# Patient Record
Sex: Female | Born: 1995 | Race: Black or African American | Hispanic: No | Smoking: Current every day smoker
Health system: Southern US, Community
[De-identification: ages and names within clinical notes are randomized; demographics above are authoritative.]

## PROBLEM LIST (undated history)

## (undated) DIAGNOSIS — Z3490 Encounter for supervision of normal pregnancy, unspecified, unspecified trimester: Secondary | ICD-10-CM

## (undated) DIAGNOSIS — M549 Dorsalgia, unspecified: Secondary | ICD-10-CM

## (undated) DIAGNOSIS — O219 Vomiting of pregnancy, unspecified: Secondary | ICD-10-CM

## (undated) DIAGNOSIS — G8929 Other chronic pain: Secondary | ICD-10-CM

## (undated) DIAGNOSIS — O139 Gestational [pregnancy-induced] hypertension without significant proteinuria, unspecified trimester: Secondary | ICD-10-CM

## (undated) DIAGNOSIS — A549 Gonococcal infection, unspecified: Secondary | ICD-10-CM

## (undated) DIAGNOSIS — J45909 Unspecified asthma, uncomplicated: Secondary | ICD-10-CM

## (undated) HISTORY — DX: Encounter for supervision of normal pregnancy, unspecified, unspecified trimester: Z34.90

## (undated) HISTORY — DX: Vomiting of pregnancy, unspecified: O21.9

## (undated) HISTORY — DX: Gonococcal infection, unspecified: A54.9

## (undated) HISTORY — DX: Unspecified asthma, uncomplicated: J45.909

## (undated) HISTORY — DX: Gestational (pregnancy-induced) hypertension without significant proteinuria, unspecified trimester: O13.9

---
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
## 2016-04-01 ENCOUNTER — Encounter (HOSPITAL_COMMUNITY): Payer: Self-pay | Admitting: Emergency Medicine

## 2016-04-01 ENCOUNTER — Emergency Department (HOSPITAL_COMMUNITY)
Admission: EM | Admit: 2016-04-01 | Discharge: 2016-04-01 | Disposition: A | Discharge status: Home / Self Care | Payer: Medicaid Other | Attending: Dermatology | Admitting: Dermatology

## 2016-04-01 DIAGNOSIS — Z79899 Other long term (current) drug therapy: Secondary | ICD-10-CM | POA: Diagnosis not present

## 2016-04-01 DIAGNOSIS — R1030 Lower abdominal pain, unspecified: Secondary | ICD-10-CM | POA: Diagnosis not present

## 2016-04-01 DIAGNOSIS — Z5321 Procedure and treatment not carried out due to patient leaving prior to being seen by health care provider: Secondary | ICD-10-CM | POA: Insufficient documentation

## 2016-04-01 HISTORY — DX: Dorsalgia, unspecified: M54.9

## 2016-04-01 HISTORY — DX: Other chronic pain: G89.29

## 2016-04-01 LAB — COMPREHENSIVE METABOLIC PANEL
ALBUMIN: 4.4 g/dL (ref 3.5–5.0)
ALT: 8 U/L — ABNORMAL LOW (ref 14–54)
ANION GAP: 7 (ref 5–15)
AST: 14 U/L — ABNORMAL LOW (ref 15–41)
Alkaline Phosphatase: 60 U/L (ref 38–126)
BILIRUBIN TOTAL: 0.6 mg/dL (ref 0.3–1.2)
BUN: 6 mg/dL (ref 6–20)
CHLORIDE: 108 mmol/L (ref 101–111)
CO2: 26 mmol/L (ref 22–32)
Calcium: 9.5 mg/dL (ref 8.9–10.3)
Creatinine, Ser: 0.82 mg/dL (ref 0.44–1.00)
GFR calc Af Amer: >60 mL/min (ref >60)
Glucose, Bld: 90 mg/dL (ref 65–99)
POTASSIUM: 3.6 mmol/L (ref 3.5–5.1)
Sodium: 141 mmol/L (ref 135–145)
TOTAL PROTEIN: 7.5 g/dL (ref 6.5–8.1)

## 2016-04-01 LAB — CBC
HEMATOCRIT: 35.6 % — AB (ref 36.0–46.0)
HEMOGLOBIN: 11.8 g/dL — AB (ref 12.0–15.0)
MCH: 28.6 pg (ref 26.0–34.0)
MCHC: 33.1 g/dL (ref 30.0–36.0)
MCV: 86.4 fL (ref 78.0–100.0)
Platelets: 324 10*3/uL (ref 150–400)
RBC: 4.12 MIL/uL (ref 3.87–5.11)
RDW: 15.5 % (ref 11.5–15.5)
WBC: 12.6 10*3/uL — AB (ref 4.0–10.5)

## 2016-04-01 LAB — LIPASE, BLOOD: LIPASE: 10 U/L — AB (ref 11–51)

## 2016-04-01 NOTE — ED Notes (Signed)
Patient complaining of lower abdominal pain x 2 days. States pain started radiating into right side today. Denies dysuria.

## 2016-04-01 NOTE — ED Notes (Signed)
Notified by registration that patient left.  

## 2016-04-02 ENCOUNTER — Encounter (HOSPITAL_COMMUNITY): Payer: Self-pay

## 2016-04-02 ENCOUNTER — Emergency Department (HOSPITAL_COMMUNITY)
Admission: EM | Admit: 2016-04-02 | Discharge: 2016-04-02 | Disposition: A | Discharge status: Home / Self Care | Payer: Medicaid Other | Attending: Emergency Medicine | Admitting: Emergency Medicine

## 2016-04-02 DIAGNOSIS — R1030 Lower abdominal pain, unspecified: Secondary | ICD-10-CM | POA: Diagnosis not present

## 2016-04-02 DIAGNOSIS — K5900 Constipation, unspecified: Secondary | ICD-10-CM | POA: Diagnosis not present

## 2016-04-02 DIAGNOSIS — R109 Unspecified abdominal pain: Secondary | ICD-10-CM

## 2016-04-02 LAB — POC URINE PREG, ED: Preg Test, Ur: NEGATIVE

## 2016-04-02 NOTE — ED Notes (Signed)
Pt reports she has been constipated since yesterday morning, last BM reported was 03/31/16. Pt reports stool was hard. Pt reports lower abdominal pain, more towards the right side.

## 2016-04-02 NOTE — Discharge Instructions (Signed)
Get miralax and put one dose or 17 g in 8 ounces of water,  take 1 dose every 30 minutes for 2-3 hours or until you  get good results and then once or twice daily to prevent constipation.  Recheck if you get fever, vomiting or seem worse.     Constipation, Adult Constipation is when a person has fewer than three bowel movements a week, has difficulty having a bowel movement, or has stools that are dry, hard, or larger than normal. As people grow older, constipation is more common. A low-fiber diet, not taking in enough fluids, and taking certain medicines may make constipation worse.  CAUSES   Certain medicines, such as antidepressants, pain medicine, iron supplements, antacids, and water pills.   Certain diseases, such as diabetes, irritable bowel syndrome (IBS), thyroid disease, or depression.   Not drinking enough water.   Not eating enough fiber-rich foods.   Stress or travel.   Lack of physical activity or exercise.   Ignoring the urge to have a bowel movement.   Using laxatives too much.  SIGNS AND SYMPTOMS   Having fewer than three bowel movements a week.   Straining to have a bowel movement.   Having stools that are hard, dry, or larger than normal.   Feeling full or bloated.   Pain in the lower abdomen.   Not feeling relief after having a bowel movement.  DIAGNOSIS  Your health care provider will take a medical history and perform a physical exam. Further testing may be done for severe constipation. Some tests may include:  A barium enema X-ray to examine your rectum, colon, and, sometimes, your small intestine.   A sigmoidoscopy to examine your lower colon.   A colonoscopy to examine your entire colon. TREATMENT  Treatment will depend on the severity of your constipation and what is causing it. Some dietary treatments include drinking more fluids and eating more fiber-rich foods. Lifestyle treatments may include regular exercise. If these diet  and lifestyle recommendations do not help, your health care provider may recommend taking over-the-counter laxative medicines to help you have bowel movements. Prescription medicines may be prescribed if over-the-counter medicines do not work.  HOME CARE INSTRUCTIONS   Eat foods that have a lot of fiber, such as fruits, vegetables, whole grains, and beans.  Limit foods high in fat and processed sugars, such as french fries, hamburgers, cookies, candies, and soda.   A fiber supplement may be added to your diet if you cannot get enough fiber from foods.   Drink enough fluids to keep your urine clear or pale yellow.   Exercise regularly or as directed by your health care provider.   Go to the restroom when you have the urge to go. Do not hold it.   Only take over-the-counter or prescription medicines as directed by your health care provider. Do not take other medicines for constipation without talking to your health care provider first.  SEEK IMMEDIATE MEDICAL CARE IF:   You have bright red blood in your stool.   Your constipation lasts for more than 4 days or gets worse.   You have abdominal or rectal pain.   You have thin, pencil-like stools.   You have unexplained weight loss. MAKE SURE YOU:   Understand these instructions.  Will watch your condition.  Will get help right away if you are not doing well or get worse.   This information is not intended to replace advice given to you by your health  care provider. Make sure you discuss any questions you have with your health care provider.   Document Released: 05/29/2004 Document Revised: 09/21/2014 Document Reviewed: 06/12/2013 Elsevier Interactive Patient Education [employer].

## 2016-04-02 NOTE — ED Notes (Cosign Needed)
Pt refused in/out cath. Dr Lynelle DoctorKnapp informed. This nurse re-explained the importance of having this a clean urine specimen, as she is on menstrual cycle and this will affect the results and accuracy of urine specimen.  Pt is still refusing in/out cath.

## 2016-04-02 NOTE — ED Notes (Signed)
Pt informed of need for urine sample.

## 2016-04-02 NOTE — ED Provider Notes (Signed)
CSN: [id number]     Arrival date & time 04/02/16  0305 History   First MD Initiated Contact with Patient 04/02/16 0345 AM     Chief Complaint  Patient presents with  . Constipation  . Abdominal Pain  . Sore Throat     (Consider location/radiation/quality/duration/timing/severity/associated sxs/prior Treatment) HPI   Patient states she started getting abdominal pain yesterday, July 19. She states there is a constant right upper quadrant pain that is sharp and hurts more if she breathes deeply. Nothing she does makes it feel better. She also has some lower abdominal pain in the suprapubic area that is described as constant and cramping. She states movement makes it feel worse. Nothing makes it feel better. She states her last bowel movement was 2 days ago and it was hard however that is normal for her. She normally has a bowel movement every day. She denies nausea, vomiting, or diarrhea. She states her period started 7 days ago and it's almost gone but she still is seeing some blood. This was her last normal period and is on time and acting normally as far as length and flow. She does not use birth control and she states her last sexual contact was 3 or 4 months ago. She does describe some dysuria and a pressure over her bladder when she urinates. She denies frequency.  PCP [facility] in [place]   Past Medical History  Diagnosis Date  . Chronic back pain    History reviewed. No pertinent past surgical history. No family history on file. Social History  Substance Use Topics  . Smoking status: Never Smoker   . Smokeless tobacco: None  . Alcohol Use: No   unemployed  OB History    No data available     Review of Systems  All other systems reviewed and are negative.     Allergies  Ultram; Amoxicillin; Ceclor; and Red dye  Home Medications   Prior to Admission medications   Medication Sig Start Date End Date Taking? Authorizing Provider  azithromycin (ZITHROMAX Z-PAK)  250 MG tablet 2 po day one, then 1 daily x 4 days 05/30/13   Bethann Berkshire, MD  cetirizine (ZYRTEC) 10 MG tablet Take 10 mg by mouth daily as needed for allergies.    Historical Provider, MD  cyclobenzaprine (FLEXERIL) 5 MG tablet Take 1 tablet (5 mg total) by mouth 3 (three) times daily as needed for muscle spasms. 11/02/15   Hope Orlene Och, NP  diclofenac (VOLTAREN) 50 MG EC tablet Take 1 tablet (50 mg total) by mouth 2 (two) times daily. 11/02/15   Hope Orlene Och, NP  omeprazole (PRILOSEC) 20 MG capsule Take 20 mg by mouth daily as needed (for ulcer).    Historical Provider, MD   BP 131/82 mmHg  Pulse 79  Temp(Src) 98.3 F (36.8 C) (Oral)  Ht  (1.753 m)  Wt 243 lb (110.224 kg)  BMI 35.87 kg/m2  SpO2 100%  LMP 03/27/2016  Vital signs normal   Physical Exam  Constitutional: She is oriented to person, place, and time. She appears well-developed and well-nourished.  Non-toxic appearance. She does not appear ill. No distress.  HENT:  Head: Normocephalic and atraumatic.  Right Ear: External ear normal.  Left Ear: External ear normal.  Nose: Nose normal. No mucosal edema or rhinorrhea.  Mouth/Throat: Oropharynx is clear and moist and mucous membranes are normal. No dental abscesses or uvula [id number]: Conjunctivae and EOM are normal. Pupils are equal, round, and  reactive to light.  Neck: Normal range of motion and full passive range of motion without pain. Neck supple.  Cardiovascular: Normal rate, regular rhythm and normal heart sounds.  Exam reveals no gallop and no friction rub.   No murmur heard. Pulmonary/Chest: Effort normal and breath sounds normal. No respiratory distress. She has no wheezes. She has no rhonchi. She has no rales. She exhibits no tenderness and no crepitus.  Abdominal: Soft. Normal appearance and bowel sounds are normal. She exhibits no distension. There is tenderness in the right upper quadrant and suprapubic area. There is no rebound and no guarding.     Musculoskeletal: Normal range of motion. She exhibits no edema or tenderness.  Moves all extremities well.   Neurological: She is alert and oriented to person, place, and time. She has normal strength. No cranial nerve deficit.  Skin: Skin is warm, dry and intact. No rash noted. No erythema. No pallor.  Psychiatric: She has a normal mood and affect. Her speech is normal and behavior is normal. Her mood appears not anxious.  Nursing note and vitals reviewed.   ED Course  Procedures (including critical care time)    Patient had come earlier in the evening and left without being seen. She had blood work done during that ED visit. I discussed that she needs in and out cath because she still having menstrual bleeding.  Despite being instructed several times by nursing staff and myself patient has refused a cath urine sample. The urine does have a slightly reddish orange tint to it. Therefore only urine pregnancy test was done.  We discussed she could go to her primary care doctor next week when she is off her period and have her urine tested at that time.   Labs Review  Results for orders placed or performed during the hospital encounter of 04/02/16  P[facility] preg, ED [facility])  Result Value Ref Range   Preg Test, Ur NEGATIVE NEGATIVE   Laboratory interpretation all normal  Results for orders placed or performed during the hospital encounter of 04/01/16  Lipase, blood  Result Value Ref Range   Lipase 10 (L) 11 - 51 U/L  Comprehensive metabolic panel  Result Value Ref Range   Sodium 141 135 - 145 mmol/L   Potassium 3.6 3.5 - 5.1 mmol/L   Chloride 108 101 - 111 mmol/L   CO2 26 22 - 32 mmol/L   Glucose, Bld 90 65 - 99 mg/dL   BUN 6 6 - 20 mg/dL   Creatinine, Ser 1.610.82 0.44 - 1.00 mg/dL   Calcium 9.5 8.9 - 09.610.3 mg/dL   Total Protein 7.5 6.5 - 8.1 g/dL   Albumin 4.4 3.5 - 5.0 g/dL   AST 14 (L) 15 - 41 U/L   ALT 8 (L) 14 - 54 U/L   Alkaline Phosphatase 60 38 - 126 U/L    Total Bilirubin 0.6 0.3 - 1.2 mg/dL   GFR calc non Af Amer >60 >60 mL/min   GFR calc Af Amer >60 >60 mL/min   Anion gap 7 5 - 15  CBC  Result Value Ref Range   WBC 12.6 (H) 4.0 - 10.5 K/uL   RBC 4.12 3.87 - 5.11 MIL/uL   Hemoglobin 11.8 (L) 12.0 - 15.0 g/dL   HCT 04.535.6 (L) 40.936.0 - 81.146.0 %   MCV 86.4 78.0 - 100.0 fL   MCH 28.6 26.0 - 34.0 pg   MCHC 33.1 30.0 - 36.0 g/dL   RDW 91.415.5 78.211.5 -  15.5 %   Platelets 324 150 - 400 K/uL   Laboratory interpretation all normal except mild anemia   I have personally reviewed and evaluated these images and lab results as part of my medical decision-making.    MDM   Final diagnoses:  Abdominal pain, unspecified abdominal location  Constipation, unspecified constipation type   meds OTC miralax  Plan discharge  Devoria Albe, MD, Concha Pyo, MD 04/02/16 [phone]

## 2016-07-29 ENCOUNTER — Encounter: Payer: Self-pay | Admitting: *Deleted

## 2016-07-30 ENCOUNTER — Other Ambulatory Visit: Payer: Self-pay | Admitting: Obstetrics and Gynecology

## 2016-07-30 DIAGNOSIS — O3680X0 Pregnancy with inconclusive fetal viability, not applicable or unspecified: Secondary | ICD-10-CM

## 2016-07-31 ENCOUNTER — Other Ambulatory Visit: Payer: Self-pay | Admitting: Obstetrics and Gynecology

## 2016-07-31 ENCOUNTER — Ambulatory Visit (INDEPENDENT_AMBULATORY_CARE_PROVIDER_SITE_OTHER): Payer: Medicaid Other

## 2016-07-31 ENCOUNTER — Encounter (INDEPENDENT_AMBULATORY_CARE_PROVIDER_SITE_OTHER): Payer: Self-pay

## 2016-07-31 DIAGNOSIS — O3680X0 Pregnancy with inconclusive fetal viability, not applicable or unspecified: Secondary | ICD-10-CM

## 2016-07-31 DIAGNOSIS — Z3A01 Less than 8 weeks gestation of pregnancy: Secondary | ICD-10-CM

## 2016-07-31 DIAGNOSIS — O3491 Maternal care for abnormality of pelvic organ, unspecified, first trimester: Secondary | ICD-10-CM | POA: Diagnosis not present

## 2016-07-31 NOTE — Progress Notes (Signed)
US TV 6+1 wks GS w/ys,no fetal pole seen,normal right ov,simple left corpus luteal cyst 2.8 x 2.7 x 2.8 cm,pt will come back in 10 days for f/u ultrasound.

## 2016-08-05 ENCOUNTER — Other Ambulatory Visit: Payer: Self-pay | Admitting: Obstetrics and Gynecology

## 2016-08-05 ENCOUNTER — Telehealth: Payer: Self-pay | Admitting: Obstetrics & Gynecology

## 2016-08-05 DIAGNOSIS — O3680X0 Pregnancy with inconclusive fetal viability, not applicable or unspecified: Secondary | ICD-10-CM

## 2016-08-05 NOTE — Telephone Encounter (Signed)
Pt's mother called stating that pt is having discharge with pinkish spots with it. Pt's mother wants to know if that is normal. Pt is pregnant

## 2016-08-05 NOTE — Telephone Encounter (Signed)
Pt's mom states that she is having a clear vaginal d/c that is a little bit mucus and has some spotting of blood.  Advised this can be normal in early pregnancy.  If starts passing clots or has heavy flow of blood.

## 2016-08-10 ENCOUNTER — Other Ambulatory Visit: Payer: Self-pay | Admitting: Obstetrics and Gynecology

## 2016-08-10 ENCOUNTER — Ambulatory Visit (INDEPENDENT_AMBULATORY_CARE_PROVIDER_SITE_OTHER): Payer: Medicaid Other

## 2016-08-10 DIAGNOSIS — Z3A01 Less than 8 weeks gestation of pregnancy: Secondary | ICD-10-CM

## 2016-08-10 DIAGNOSIS — O3680X0 Pregnancy with inconclusive fetal viability, not applicable or unspecified: Secondary | ICD-10-CM | POA: Diagnosis not present

## 2016-08-10 DIAGNOSIS — O3491 Maternal care for abnormality of pelvic organ, unspecified, first trimester: Secondary | ICD-10-CM | POA: Diagnosis not present

## 2016-08-10 NOTE — Progress Notes (Signed)
US 6+5 wks,single IUP w/ys,pos fht 130 bpm,crl 8.7 mm,simple left corpus luteal cyst 3.1 x 2.8 x 2.5 cm,normal right ovary

## 2016-08-14 ENCOUNTER — Ambulatory Visit (INDEPENDENT_AMBULATORY_CARE_PROVIDER_SITE_OTHER): Payer: Self-pay | Admitting: Adult Health

## 2016-08-14 ENCOUNTER — Encounter: Payer: Self-pay | Admitting: Adult Health

## 2016-08-14 VITALS — BP 120/60 | HR 62 | Wt 240.0 lb

## 2016-08-14 DIAGNOSIS — Z1389 Encounter for screening for other disorder: Secondary | ICD-10-CM

## 2016-08-14 DIAGNOSIS — Z3A01 Less than 8 weeks gestation of pregnancy: Secondary | ICD-10-CM

## 2016-08-14 DIAGNOSIS — Z3401 Encounter for supervision of normal first pregnancy, first trimester: Secondary | ICD-10-CM

## 2016-08-14 DIAGNOSIS — Z3490 Encounter for supervision of normal pregnancy, unspecified, unspecified trimester: Secondary | ICD-10-CM

## 2016-08-14 DIAGNOSIS — O219 Vomiting of pregnancy, unspecified: Secondary | ICD-10-CM | POA: Diagnosis not present

## 2016-08-14 DIAGNOSIS — Z331 Pregnant state, incidental: Secondary | ICD-10-CM

## 2016-08-14 HISTORY — DX: Vomiting of pregnancy, unspecified: O21.9

## 2016-08-14 HISTORY — DX: Encounter for supervision of normal pregnancy, unspecified, unspecified trimester: Z34.90

## 2016-08-14 LAB — POCT URINALYSIS DIPSTICK
GLUCOSE UA: NEGATIVE
Ketones, UA: NEGATIVE
NITRITE UA: NEGATIVE
RBC UA: NEGATIVE

## 2016-08-14 MED ORDER — PROMETHAZINE HCL 25 MG PO TABS: 25.0000 mg | ORAL_TABLET | Freq: Four times a day (QID) | ORAL | 1 refills | Status: DC | PRN

## 2016-08-14 NOTE — Patient Instructions (Signed)
Eat often  Take phenergan as needed every 6 hours Follow up in 3 weeks

## 2016-08-14 NOTE — Progress Notes (Signed)
Subjective:  Nilda RiggsGemini Stanbery is a 20 y.o. G1P0 African American female at 7114w2d by LMP and US being seen today for her first obstetrical visit.  Her obstetrical history is significant for asthma.  Pregnancy history fully reviewed.  Patient reports nausea, and vomiting. Denies vb, cramping, uti s/s, abnormal/malodorous vag d/c, or vulvovaginal itching/irritation.  BP 120/60   Pulse 62   Wt 240 lb (108.9 kg)   LMP 06/14/2016 (Approximate)   BMI 35.44 kg/m   HISTORY: OB History  Gravida Para Term Preterm AB Living  1            SAB TAB Ectopic Multiple Live Births               # Outcome Date GA Lbr Len/2nd Weight Sex Delivery Anes PTL Lv  1 Current              Past Medical History:  Diagnosis Date  . Asthma   . Chronic back pain   . Supervision of normal pregnancy 08/14/2016    [facility] at   7+2 weeks FOB  malick mcfarland 21 yo bm 2nd Dating By  LMP and US  Pap   NA GC/CT Initial:                36+wks: Genetic Screen NT/IT:  CF screen   Declined  Anatomic US  Flu vaccine  Tdap Recommended ~ 28wks Glucose Screen  2 hr GBS  Feed Preference  Contraception  Circumcision  Childbirth Classes  Pediatrician     History reviewed. No pertinent surgical history. Family History  Problem Relation Age of Onset  . Other Mother     degenerative disc  . Other Father     heart surgery    Exam   System:     General: Well developed & nourished, no acute distress   Skin: Warm & dry, normal coloration and turgor, no rashes   Neurologic: Alert & oriented, normal mood   Cardiovascular: Regular rate & rhythm   Respiratory: Effort & rate normal, LCTAB, acyanotic   Abdomen: Soft, non tender   Extremities: normal strength, tone   Pelvic Exam:    Perineum: deferred   Vulva: deferred   Vagina:  deferred   Cervix: deferred   Uterus: deferred     Assessment:   Pregnancy: G1P0 Patient Active Problem List   Diagnosis Date Noted  . Supervision of normal pregnancy  08/14/2016    5114w2d G1P0 New OB visit     Plan:  Initial labs drawn Continue prenatal vitamins Problem list reviewed and updated Reviewed n/v relief measures and warning s/s to report Reviewed recommended weight gain based on pre-gravid BMI Encouraged well-balanced diet Genetic Screening discussed Integrated Screen: requested Cystic fibrosis screening discussed declined Ultrasound discussed; fetal survey: requested Follow up in 3 weeks for OB visit with Selena BattenKim  Rx phenergan 25 mg #30 take 1 every 6 hours prn N/V, with 1 refill PHQ 2 score 0, declines flu shot today, will ask again.  Adline PotterJennifer A. Maddelynn Moosman, NP 08/14/2016 1:36 PM

## 2016-08-17 LAB — CBC
HEMATOCRIT: 36.9 % (ref 34.0–46.6)
Hemoglobin: 12.7 g/dL (ref 11.1–15.9)
MCH: 29.1 pg (ref 26.6–33.0)
MCHC: 34.4 g/dL (ref 31.5–35.7)
MCV: 85 fL (ref 79–97)
Platelets: 395 10*3/uL — ABNORMAL HIGH (ref 150–379)
RBC: 4.36 x10E6/uL (ref 3.77–5.28)
RDW: 16.7 % — AB (ref 12.3–15.4)
WBC: 12.2 10*3/uL — ABNORMAL HIGH (ref 3.4–10.8)

## 2016-08-17 LAB — ABO/RH: Rh Factor: POSITIVE

## 2016-08-17 LAB — ANTIBODY SCREEN: ANTIBODY SCREEN: NEGATIVE

## 2016-08-17 LAB — HEPATITIS B SURFACE ANTIGEN: Hepatitis B Surface Ag: NEGATIVE

## 2016-08-17 LAB — VARICELLA ZOSTER ANTIBODY, IGG: VARICELLA: 198 {index} (ref >165)

## 2016-08-17 LAB — RUBELLA SCREEN: Rubella Antibodies, IGG: 2.32 index (ref >0.99)

## 2016-08-17 LAB — RPR: RPR Ser Ql: NONREACTIVE

## 2016-08-17 LAB — HIV ANTIBODY (ROUTINE TESTING W REFLEX): HIV SCREEN 4TH GENERATION: NONREACTIVE

## 2016-08-17 LAB — SICKLE CELL SCREEN: Sickle Cell Screen: NEGATIVE

## 2016-09-04 ENCOUNTER — Encounter: Payer: Self-pay | Admitting: Obstetrics & Gynecology

## 2016-09-04 ENCOUNTER — Ambulatory Visit (INDEPENDENT_AMBULATORY_CARE_PROVIDER_SITE_OTHER): Payer: Self-pay | Admitting: Obstetrics & Gynecology

## 2016-09-04 VITALS — BP 124/60 | HR 82 | Wt 243.5 lb

## 2016-09-04 DIAGNOSIS — Z1389 Encounter for screening for other disorder: Secondary | ICD-10-CM

## 2016-09-04 DIAGNOSIS — Z3A11 11 weeks gestation of pregnancy: Secondary | ICD-10-CM

## 2016-09-04 DIAGNOSIS — Z3401 Encounter for supervision of normal first pregnancy, first trimester: Secondary | ICD-10-CM

## 2016-09-04 DIAGNOSIS — Z331 Pregnant state, incidental: Secondary | ICD-10-CM

## 2016-09-04 LAB — POCT URINALYSIS DIPSTICK
GLUCOSE UA: NEGATIVE
KETONES UA: NEGATIVE
Leukocytes, UA: NEGATIVE
Nitrite, UA: NEGATIVE
RBC UA: NEGATIVE

## 2016-09-04 MED ORDER — SULFAMETHOXAZOLE-TRIMETHOPRIM 800-160 MG PO TABS: 1.0000 | ORAL_TABLET | Freq: Two times a day (BID) | ORAL | 0 refills | Status: DC

## 2016-09-04 NOTE — Progress Notes (Signed)
G1P0 2012w2d Estimated Date of Delivery: 03/31/17  Blood pressure 124/60, pulse 82, weight 243 lb 8 oz (110.5 kg), last menstrual period 06/14/2016.   BP weight and urine results all reviewed and noted.  Please refer to the obstetrical flow sheet for the fundal height and fetal heart rate documentation:  Patient reports good fetal movement, denies any bleeding and no rupture of membranes symptoms or regular contractions. Patient is without complaints. All questions were answered.  Orders Placed This Encounter  Procedures  . Urine culture  . GC/Chlamydia Probe Amp  . US Fetal Nuchal Translucency Measurement  . POCT Urinalysis Dipstick    Plan:  Continued routine obstetrical care,  Bactrim DS #14 for a infected right labial cyst  Return in about 2 weeks (around 09/18/2016) for US nuchal trans, , LROB.

## 2016-09-06 LAB — GC/CHLAMYDIA PROBE AMP
Chlamydia trachomatis, NAA: NEGATIVE
NEISSERIA GONORRHOEAE BY PCR: NEGATIVE

## 2016-09-10 LAB — URINE CULTURE: Organism ID, Bacteria: NO GROWTH

## 2016-09-16 ENCOUNTER — Other Ambulatory Visit: Payer: Self-pay | Admitting: Obstetrics & Gynecology

## 2016-09-16 DIAGNOSIS — Z3682 Encounter for antenatal screening for nuchal translucency: Secondary | ICD-10-CM

## 2016-09-17 ENCOUNTER — Ambulatory Visit (INDEPENDENT_AMBULATORY_CARE_PROVIDER_SITE_OTHER): Payer: Medicaid Other

## 2016-09-17 ENCOUNTER — Encounter: Payer: Self-pay | Admitting: Women's Health

## 2016-09-17 ENCOUNTER — Ambulatory Visit (INDEPENDENT_AMBULATORY_CARE_PROVIDER_SITE_OTHER): Payer: Medicaid Other | Admitting: Women's Health

## 2016-09-17 VITALS — BP 135/66 | HR 90 | Wt 233.0 lb

## 2016-09-17 DIAGNOSIS — Z3401 Encounter for supervision of normal first pregnancy, first trimester: Secondary | ICD-10-CM

## 2016-09-17 DIAGNOSIS — O3491 Maternal care for abnormality of pelvic organ, unspecified, first trimester: Secondary | ICD-10-CM | POA: Diagnosis not present

## 2016-09-17 DIAGNOSIS — Z3A12 12 weeks gestation of pregnancy: Secondary | ICD-10-CM

## 2016-09-17 DIAGNOSIS — Z331 Pregnant state, incidental: Secondary | ICD-10-CM

## 2016-09-17 DIAGNOSIS — Z1389 Encounter for screening for other disorder: Secondary | ICD-10-CM

## 2016-09-17 DIAGNOSIS — Z3682 Encounter for antenatal screening for nuchal translucency: Secondary | ICD-10-CM | POA: Diagnosis not present

## 2016-09-17 LAB — POCT URINALYSIS DIPSTICK
Blood, UA: NEGATIVE
Glucose, UA: NEGATIVE
KETONES UA: NEGATIVE
Nitrite, UA: NEGATIVE

## 2016-09-17 NOTE — Patient Instructions (Signed)
Second Trimester of Pregnancy The second trimester is from week 13 through week 28 (months 4 through 6). The second trimester is often a time when you feel your best. Your body has also adjusted to being pregnant, and you begin to feel better physically. Usually, morning sickness has lessened or quit completely, you may have more energy, and you may have an increase in appetite. The second trimester is also a time when the fetus is growing rapidly. At the end of the sixth month, the fetus is about 9 inches long and weighs about 1 pounds. You will likely begin to feel the baby move (quickening) between 18 and 20 weeks of the pregnancy. Body changes during your second trimester Your body continues to go through many changes during your second trimester. The changes vary from woman to woman.  Your weight will continue to increase. You will notice your lower abdomen bulging out.  You may begin to get stretch marks on your hips, abdomen, and breasts.  You may develop headaches that can be relieved by medicines. The medicines should be approved by your health care provider.  You may urinate more often because the fetus is pressing on your bladder.  You may develop or continue to have heartburn as a result of your pregnancy.  You may develop constipation because certain hormones are causing the muscles that push waste through your intestines to slow down.  You may develop hemorrhoids or swollen, bulging veins (varicose veins).  You may have back pain. This is caused by:  Weight gain.  Pregnancy hormones that are relaxing the joints in your pelvis.  A shift in weight and the muscles that support your balance.  Your breasts will continue to grow and they will continue to become tender.  Your gums may bleed and may be sensitive to brushing and flossing.  Dark spots or blotches (chloasma, mask of pregnancy) may develop on your face. This will likely fade after the baby is born.  A dark line  from your belly button to the pubic area (linea nigra) may appear. This will likely fade after the baby is born.  You may have changes in your hair. These can include thickening of your hair, rapid growth, and changes in texture. Some women also have hair loss during or after pregnancy, or hair that feels dry or thin. Your hair will most likely return to normal after your baby is born. What to expect at prenatal visits During a routine prenatal visit:  You will be weighed to make sure you and the fetus are growing normally.  Your blood pressure will be taken.  Your abdomen will be measured to track your baby's growth.  The fetal heartbeat will be listened to.  Any test results from the previous visit will be discussed. Your health care provider may ask you:  How you are feeling.  If you are feeling the baby move.  If you have had any abnormal symptoms, such as leaking fluid, bleeding, severe headaches, or abdominal cramping.  If you are using any tobacco products, including cigarettes, chewing tobacco, and electronic cigarettes.  If you have any questions. Other tests that may be performed during your second trimester include:  Blood tests that check for:  Low iron levels (anemia).  Gestational diabetes (between 24 and 28 weeks).  Rh antibodies. This is to check for a protein on red blood cells (Rh factor).  Urine tests to check for infections, diabetes, or protein in the urine.  An ultrasound to   confirm the proper growth and development of the baby.  An amniocentesis to check for possible genetic problems.  Fetal screens for spina bifida and Down syndrome.  HIV (human immunodeficiency virus) testing. Routine prenatal testing includes screening for HIV, unless you choose not to have this test. Follow these instructions at home: Eating and drinking  Continue to eat regular, healthy meals.  Avoid raw meat, uncooked cheese, cat litter boxes, and soil used by cats. These  carry germs that can cause birth defects in the baby.  Take your prenatal vitamins.  Take 1500-2000 mg of calcium daily starting at the 20th week of pregnancy until you deliver your baby.  If you develop constipation:  Take over-the-counter or prescription medicines.  Drink enough fluid to keep your urine clear or pale yellow.  Eat foods that are high in fiber, such as fresh fruits and vegetables, whole grains, and beans.  Limit foods that are high in fat and processed sugars, such as fried and sweet foods. Activity  Exercise only as directed by your health care provider. Experiencing uterine cramps is a good sign to stop exercising.  Avoid heavy lifting, wear low heel shoes, and practice good posture.  Wear your seat belt at all times when driving.  Rest with your legs elevated if you have leg cramps or low back pain.  Wear a good support bra for breast tenderness.  Do not use hot tubs, steam rooms, or saunas. Lifestyle  Avoid all smoking, herbs, alcohol, and unprescribed drugs. These chemicals affect the formation and growth of the baby.  Do not use any products that contain nicotine or tobacco, such as cigarettes and e-cigarettes. If you need help quitting, ask your health care provider.  A sexual relationship may be continued unless your health care provider directs you otherwise. General instructions  Follow your health care provider's instructions regarding medicine use. There are medicines that are either safe or unsafe to take during pregnancy.  Take warm sitz baths to soothe any pain or discomfort caused by hemorrhoids. Use hemorrhoid cream if your health care provider approves.  If you develop varicose veins, wear support hose. Elevate your feet for 15 minutes, 3-4 times a day. Limit salt in your diet.  Visit your dentist if you have not gone yet during your pregnancy. Use a soft toothbrush to brush your teeth and be gentle when you floss.  Keep all follow-up  prenatal visits as told by your health care provider. This is important. Contact a health care provider if:  You have dizziness.  You have mild pelvic cramps, pelvic pressure, or nagging pain in the abdominal area.  You have persistent nausea, vomiting, or diarrhea.  You have a bad smelling vaginal discharge.  You have pain with urination. Get help right away if:  You have a fever.  You are leaking fluid from your vagina.  You have spotting or bleeding from your vagina.  You have severe abdominal cramping or pain.  You have rapid weight gain or weight loss.  You have shortness of breath with chest pain.  You notice sudden or extreme swelling of your face, hands, ankles, feet, or legs.  You have not felt your baby move in over an hour.  You have severe headaches that do not go away with medicine.  You have vision changes. Summary  The second trimester is from week 13 through week 28 (months 4 through 6). It is also a time when the fetus is growing rapidly.  Your body goes   through many changes during pregnancy. The changes vary from woman to woman.  Avoid all smoking, herbs, alcohol, and unprescribed drugs. These chemicals affect the formation and growth your baby.  Do not use any tobacco products, such as cigarettes, chewing tobacco, and e-cigarettes. If you need help quitting, ask your health care provider.  Contact your health care provider if you have any questions. Keep all prenatal visits as told by your health care provider. This is important. This information is not intended to replace advice given to you by your health care provider. Make sure you discuss any questions you have with your health care provider. Document Released: 08/25/2001 Document Revised: 02/06/2016 Document Reviewed: 11/01/2012 Elsevier Interactive Patient Education  2017 [employer]  

## 2016-09-17 NOTE — Addendum Note (Signed)
Addended by: Gaylyn RongEVANS, Naysha Sholl A on: 09/17/2016 05:05 PM   Modules accepted: Orders

## 2016-09-17 NOTE — Progress Notes (Signed)
US 12+1 wks,measurements c/w dates,normal right ov,simple left ov cyst 2.9 x 3 x 2.8 cm,crl 61.1 mm,NB present,NT 1.6 mm

## 2016-09-17 NOTE — Progress Notes (Signed)
Low-risk OB appointment G1P0 4272w1d Estimated Date of Delivery: 03/31/17 BP 135/66   Pulse 90   Wt 233 lb (105.7 kg)   LMP 06/14/2016 (Approximate)   BMI 34.41 kg/m   BP, weight, and urine reviewed.  Refer to obstetrical flow sheet for FH & FHR.  No fm yet. Denies cramping, lof, vb, or uti s/s. No complaints. Reviewed today's normal nt u/s. Discussed warning s/s to report. Recommended flu shot w/ pcp/hd (<21yo)  Plan:  Continue routine obstetrical care  F/U in 4wks for OB appointment and 2nd IT 1st IT/NT today, will also send urine for cx, uds, ua- wasn't processed at new ob visit

## 2016-09-18 ENCOUNTER — Encounter: Payer: Self-pay | Admitting: Women's Health

## 2016-09-18 ENCOUNTER — Telehealth: Payer: Self-pay | Admitting: *Deleted

## 2016-09-18 ENCOUNTER — Other Ambulatory Visit: Payer: Self-pay | Admitting: Obstetrics & Gynecology

## 2016-09-18 NOTE — Telephone Encounter (Signed)
FYIDarrel Willis: [facility]

## 2016-09-19 LAB — URINALYSIS, ROUTINE W REFLEX MICROSCOPIC
BILIRUBIN UA: NEGATIVE
Glucose, UA: NEGATIVE
NITRITE UA: NEGATIVE
RBC UA: NEGATIVE
SPEC GRAV UA: 1.024 (ref 1.005–1.030)
Urobilinogen, Ur: 0.2 mg/dL (ref 0.2–1.0)
pH, UA: 6.5 (ref 5.0–7.5)

## 2016-09-19 LAB — PMP SCREEN PROFILE (10S), URINE
Amphetamine Screen, Ur: NEGATIVE ng/mL
BARBITURATE SCRN UR: NEGATIVE ng/mL
BENZODIAZEPINE SCREEN, URINE: NEGATIVE ng/mL
CREATININE(CRT), U: 336.6 mg/dL — AB (ref 20.0–300.0)
Cannabinoids Ur Ql Scn: POSITIVE ng/mL
Cocaine(Metab.)Screen, Urine: NEGATIVE ng/mL
Methadone Scn, Ur: NEGATIVE ng/mL
OPIATE SCRN UR: NEGATIVE ng/mL
Oxycodone+Oxymorphone Ur Ql Scn: NEGATIVE ng/mL
PCP Scrn, Ur: NEGATIVE ng/mL
PROPOXYPHENE SCREEN: NEGATIVE ng/mL
Ph of Urine: 6.1 (ref 4.5–8.9)

## 2016-09-19 LAB — MICROSCOPIC EXAMINATION
Bacteria, UA: NONE SEEN
CASTS: NONE SEEN /LPF

## 2016-09-20 LAB — MATERNAL SCREEN, INTEGRATED #1
Crown Rump Length: 61.1 mm
Gest. Age on Collection Date: 12.6 weeks
Maternal Age at EDD: 19.7 years
NUCHAL TRANSLUCENCY (NT): 1.6 mm
Number of Fetuses: 1
PAPP-A VALUE: 227.8 ng/mL
Weight: 233 [lb_av]

## 2016-09-20 LAB — GC/CHLAMYDIA PROBE AMP
CHLAMYDIA, DNA PROBE: NEGATIVE
NEISSERIA GONORRHOEAE BY PCR: NEGATIVE

## 2016-09-20 LAB — URINE CULTURE

## 2016-09-21 ENCOUNTER — Encounter: Payer: Self-pay | Admitting: *Deleted

## 2016-09-21 ENCOUNTER — Other Ambulatory Visit: Payer: Self-pay | Admitting: Women's Health

## 2016-09-21 ENCOUNTER — Encounter: Payer: Self-pay | Admitting: Women's Health

## 2016-09-21 ENCOUNTER — Telehealth: Payer: Self-pay | Admitting: Women's Health

## 2016-09-21 DIAGNOSIS — F1290 Cannabis use, unspecified, uncomplicated: Secondary | ICD-10-CM | POA: Insufficient documentation

## 2016-09-21 MED ORDER — CITRANATAL ASSURE 35-1 & 300 MG PO MISC: ORAL | 11 refills | Status: DC

## 2016-09-21 MED ORDER — SULFAMETHOXAZOLE-TRIMETHOPRIM 800-160 MG PO TABS: 1.0000 | ORAL_TABLET | Freq: Two times a day (BID) | ORAL | 0 refills | Status: DC

## 2016-09-21 NOTE — Telephone Encounter (Signed)
Pt informed of need for OTC Fe in addition to PNV, pt states she was taking the [place] vit + iron but they are now making her sick and would like [place] [place].  Pt also states she has a cold and is having cough and congestion.  Advised pt to push fluids, use saline nasal spray, Tylenol, cough drops and sleep with humidifier.  Pt verbalized understanding.

## 2016-09-21 NOTE — Telephone Encounter (Signed)
Pt states she was taking an ABX for cyst on her vagina, she took her last pill on Friday and spot looks much better but is not completely gone, pt is requesting another round of ABX be sent to her pharmacy.

## 2016-10-15 ENCOUNTER — Encounter: Payer: Self-pay | Admitting: Obstetrics & Gynecology

## 2016-10-15 ENCOUNTER — Other Ambulatory Visit: Payer: Medicaid Other

## 2016-10-15 ENCOUNTER — Ambulatory Visit (INDEPENDENT_AMBULATORY_CARE_PROVIDER_SITE_OTHER): Payer: Medicaid Other | Admitting: Obstetrics & Gynecology

## 2016-10-15 ENCOUNTER — Encounter: Payer: Medicaid Other | Admitting: Women's Health

## 2016-10-15 VITALS — BP 130/80 | HR 78 | Wt 237.0 lb

## 2016-10-15 DIAGNOSIS — Z3A16 16 weeks gestation of pregnancy: Secondary | ICD-10-CM

## 2016-10-15 DIAGNOSIS — Z369 Encounter for antenatal screening, unspecified: Secondary | ICD-10-CM

## 2016-10-15 DIAGNOSIS — Z331 Pregnant state, incidental: Secondary | ICD-10-CM

## 2016-10-15 DIAGNOSIS — Z1389 Encounter for screening for other disorder: Secondary | ICD-10-CM

## 2016-10-15 DIAGNOSIS — Z3402 Encounter for supervision of normal first pregnancy, second trimester: Secondary | ICD-10-CM

## 2016-10-15 LAB — POCT URINALYSIS DIPSTICK
Blood, UA: NEGATIVE
Glucose, UA: NEGATIVE
Ketones, UA: NEGATIVE
LEUKOCYTES UA: NEGATIVE
NITRITE UA: NEGATIVE
PROTEIN UA: 1

## 2016-10-15 NOTE — Progress Notes (Signed)
G1P0 4082w1d Estimated Date of Delivery: 03/31/17  Blood pressure 130/80, pulse 78, weight 237 lb (107.5 kg), last menstrual period 06/14/2016.   BP weight and urine results all reviewed and noted.  Please refer to the obstetrical flow sheet for the fundal height and fetal heart rate documentation:  Patient reports good fetal movement, denies any bleeding and no rupture of membranes symptoms or regular contractions. Patient is without complaints. All questions were answered.  Orders Placed This Encounter  Procedures  . US OB Comp + 14 Wk  . Maternal Screen, Integrated #2  . POCT urinalysis dipstick    Plan:  Continued routine obstetrical care, 2nd IT  Return in about 4 weeks (around 11/12/2016) for 20 week sono, LROB.

## 2016-10-23 LAB — MATERNAL SCREEN, INTEGRATED #2
AFP MARKER: 36.9 ng/mL
AFP MoM: 1.44
CROWN RUMP LENGTH: 61.1 mm
DIA MOM: 1.01
DIA VALUE: 136.8 pg/mL
ESTRIOL UNCONJUGATED: 0.6 ng/mL
GESTATIONAL AGE: 16.6 wk
Gest. Age on Collection Date: 12.6 weeks
HCG MOM: 1.13
Maternal Age at EDD: 19.7 years
NUCHAL TRANSLUCENCY (NT): 1.6 mm
NUCHAL TRANSLUCENCY MOM: 1.03
Number of Fetuses: 1
PAPP-A MoM: 0.4
PAPP-A Value: 227.8 ng/mL
TEST RESULTS: NEGATIVE
Weight: 233 [lb_av]
Weight: 233 [lb_av]
hCG Value: 25.1 IU/mL
uE3 MoM: 0.75

## 2016-11-11 ENCOUNTER — Other Ambulatory Visit: Payer: Self-pay | Admitting: Obstetrics & Gynecology

## 2016-11-11 DIAGNOSIS — Z363 Encounter for antenatal screening for malformations: Secondary | ICD-10-CM

## 2016-11-12 ENCOUNTER — Encounter: Payer: Self-pay | Admitting: Advanced Practice Midwife

## 2016-11-12 ENCOUNTER — Ambulatory Visit (INDEPENDENT_AMBULATORY_CARE_PROVIDER_SITE_OTHER): Payer: Medicaid Other

## 2016-11-12 ENCOUNTER — Ambulatory Visit (INDEPENDENT_AMBULATORY_CARE_PROVIDER_SITE_OTHER): Payer: Medicaid Other | Admitting: Advanced Practice Midwife

## 2016-11-12 VITALS — BP 128/68 | HR 87 | Wt 244.0 lb

## 2016-11-12 DIAGNOSIS — Z3402 Encounter for supervision of normal first pregnancy, second trimester: Secondary | ICD-10-CM

## 2016-11-12 DIAGNOSIS — Z3A20 20 weeks gestation of pregnancy: Secondary | ICD-10-CM | POA: Diagnosis not present

## 2016-11-12 DIAGNOSIS — Z363 Encounter for antenatal screening for malformations: Secondary | ICD-10-CM | POA: Diagnosis not present

## 2016-11-12 DIAGNOSIS — Z331 Pregnant state, incidental: Secondary | ICD-10-CM

## 2016-11-12 DIAGNOSIS — Z1389 Encounter for screening for other disorder: Secondary | ICD-10-CM

## 2016-11-12 LAB — POCT URINALYSIS DIPSTICK
Blood, UA: NEGATIVE
Glucose, UA: NEGATIVE
KETONES UA: NEGATIVE
LEUKOCYTES UA: NEGATIVE
Nitrite, UA: NEGATIVE
PROTEIN UA: NEGATIVE

## 2016-11-12 NOTE — Progress Notes (Signed)
US 20+1 wks,cephalic,ant pl gr 0,cx 3 cm,normal ov's bilat,fhr 142 bpm,svp of fluid 4.7 cm,efw 351 g,anatomy complete,no obvious abnormalities seen

## 2016-11-12 NOTE — Progress Notes (Signed)
G1P0 6847w1d Estimated Date of Delivery: 03/31/17  Last menstrual period 06/14/2016.   BP weight and urine results all reviewed and noted.  Please refer to the obstetrical flow sheet for the fundal height and fetal heart rate documentation: US 20+1 wks,cephalic,ant pl gr 0,cx 3 cm,normal ov's bilat,fhr 142 bpm,svp of fluid 4.7 cm,efw 351 g,anatomy complete,no obvious abnormalities seen  Patient reports some  fetal movement, denies any bleeding and no rupture of membranes symptoms or regular contractions. Patient is without complaints. All questions were answered.  Getting anatomy scan in a few hours  No orders of the defined types were placed in this encounter.   Plan:  Continued routine obstetrical care,  Return in about 4 weeks (around 12/10/2016) for LROB.

## 2016-12-09 ENCOUNTER — Other Ambulatory Visit: Payer: Self-pay | Admitting: Advanced Practice Midwife

## 2016-12-09 ENCOUNTER — Telehealth: Payer: Self-pay | Admitting: *Deleted

## 2016-12-09 MED ORDER — ONDANSETRON 4 MG PO TBDP: 4.0000 mg | ORAL_TABLET | Freq: Four times a day (QID) | ORAL | 1 refills | Status: DC | PRN

## 2016-12-09 NOTE — Telephone Encounter (Signed)
LMOM that a prescription for zofran [facility] [place].

## 2016-12-09 NOTE — Telephone Encounter (Signed)
Patient called with complaints of vomiting and diarrhea since yesterday. She is not bleeding or leaking but does feel baby movement. Pt is prescribed phenergan but it makes her sick. Encouraged patient to sip fluids and rest. Any other meds she could try..disolving zofran, diclegis? Please advise.

## 2016-12-09 NOTE — Telephone Encounter (Signed)
I sent rx for zofran odt

## 2016-12-10 ENCOUNTER — Encounter: Payer: Self-pay | Admitting: Advanced Practice Midwife

## 2016-12-10 ENCOUNTER — Ambulatory Visit (INDEPENDENT_AMBULATORY_CARE_PROVIDER_SITE_OTHER): Payer: Medicaid Other | Admitting: Advanced Practice Midwife

## 2016-12-10 VITALS — BP 104/60 | HR 80 | Wt 240.0 lb

## 2016-12-10 DIAGNOSIS — Z331 Pregnant state, incidental: Secondary | ICD-10-CM

## 2016-12-10 DIAGNOSIS — Z1389 Encounter for screening for other disorder: Secondary | ICD-10-CM

## 2016-12-10 DIAGNOSIS — Z3A24 24 weeks gestation of pregnancy: Secondary | ICD-10-CM

## 2016-12-10 DIAGNOSIS — Z3402 Encounter for supervision of normal first pregnancy, second trimester: Secondary | ICD-10-CM

## 2016-12-10 DIAGNOSIS — Z3482 Encounter for supervision of other normal pregnancy, second trimester: Secondary | ICD-10-CM

## 2016-12-10 DIAGNOSIS — F1290 Cannabis use, unspecified, uncomplicated: Secondary | ICD-10-CM

## 2016-12-10 DIAGNOSIS — O1212 Gestational proteinuria, second trimester: Secondary | ICD-10-CM

## 2016-12-10 LAB — POCT URINALYSIS DIPSTICK
Blood, UA: NEGATIVE
Ketones, UA: NEGATIVE
LEUKOCYTES UA: NEGATIVE
Nitrite, UA: NEGATIVE

## 2016-12-10 NOTE — Progress Notes (Signed)
G1P0 7644w1d Estimated Date of Delivery: 03/31/17  Blood pressure 104/60, pulse 80, weight 240 lb (108.9 kg), last menstrual period 06/14/2016.   BP weight and urine results all reviewed and noted.  Denies UTI sx  Please refer to the obstetrical flow sheet for the fundal height and fetal heart rate documentation:  Patient reports good fetal movement, denies any bleeding and no rupture of membranes symptoms or regular contractions. Patient is without complaints. All questions were answered.  Orders Placed This Encounter  Procedures  . Urine culture  . Pain Management Screening Profile (10S)  . POCT urinalysis dipstick    Plan:  Continued routine obstetrical care,   Return in about 3 weeks (around 12/31/2016) for PN2/LROB.

## 2016-12-10 NOTE — Patient Instructions (Signed)

## 2016-12-11 LAB — PMP SCREEN PROFILE (10S), URINE
AMPHETAMINE SCRN UR: NEGATIVE ng/mL
Barbiturate Screen, Ur: NEGATIVE ng/mL
Benzodiazepine Screen, Urine: NEGATIVE ng/mL
CANNABINOIDS UR QL SCN: POSITIVE ng/mL
Cocaine(Metab.)Screen, Urine: NEGATIVE ng/mL
Creatinine(Crt), U: 332.8 mg/dL — ABNORMAL HIGH (ref 20.0–300.0)
METHADONE SCREEN, URINE: NEGATIVE ng/mL
OXYCODONE+OXYMORPHONE UR QL SCN: NEGATIVE ng/mL
Opiate Scrn, Ur: NEGATIVE ng/mL
PCP SCRN UR: NEGATIVE ng/mL
PROPOXYPHENE SCREEN: NEGATIVE ng/mL
Ph of Urine: 6 (ref 4.5–8.9)

## 2016-12-12 LAB — URINE CULTURE

## 2016-12-31 ENCOUNTER — Encounter: Payer: Self-pay | Admitting: Women's Health

## 2016-12-31 ENCOUNTER — Other Ambulatory Visit: Payer: Medicaid Other

## 2016-12-31 ENCOUNTER — Ambulatory Visit (INDEPENDENT_AMBULATORY_CARE_PROVIDER_SITE_OTHER): Payer: Medicaid Other | Admitting: Women's Health

## 2016-12-31 VITALS — BP 120/60 | HR 103 | Wt 248.0 lb

## 2016-12-31 DIAGNOSIS — Z331 Pregnant state, incidental: Secondary | ICD-10-CM

## 2016-12-31 DIAGNOSIS — F1290 Cannabis use, unspecified, uncomplicated: Secondary | ICD-10-CM

## 2016-12-31 DIAGNOSIS — Z3402 Encounter for supervision of normal first pregnancy, second trimester: Secondary | ICD-10-CM

## 2016-12-31 DIAGNOSIS — Z1389 Encounter for screening for other disorder: Secondary | ICD-10-CM

## 2016-12-31 LAB — POCT URINALYSIS DIPSTICK
GLUCOSE UA: NEGATIVE
Ketones, UA: NEGATIVE
NITRITE UA: NEGATIVE
RBC UA: NEGATIVE

## 2016-12-31 NOTE — Patient Instructions (Signed)
You will have your sugar test next visit.  Please do not eat or drink anything after midnight the night before you come, not even water.  You will be here for at least two hours.     Call the office [phone] or go to [facility] if:  You begin to have strong, frequent contractions  Your water breaks.  Sometimes it is a big gush of fluid, sometimes it is just a trickle that keeps getting your panties wet or running down your legs  You have vaginal bleeding.  It is normal to have a small amount of spotting if your cervix was checked.   You don't feel your baby moving like normal.  If you don't, get you something to eat and drink and lay down and focus on feeling your baby move.  You should feel at least 10 movements in 2 hours.  If you don't, you should call the office or go to [facility].    Tdap Vaccine  It is recommended that you get the Tdap vaccine during the third trimester of EACH pregnancy to help protect your baby from getting pertussis (whooping cough)  27-36 weeks is the BEST time to do this so that you can pass the protection on to your baby. During pregnancy is better than after pregnancy, but if you are unable to get it during pregnancy it will be offered at the hospital.   You can get this vaccine at the health department or your family doctor  Everyone who will be around your baby should also be up-to-date on their vaccines. Adults (who are not pregnant) only need 1 dose of Tdap during adulthood.   Third Trimester of Pregnancy The third trimester is from week 29 through week 42, months 7 through 9. The third trimester is a time when the fetus is growing rapidly. At the end of the ninth month, the fetus is about 20 inches in length and weighs 6-10 pounds.  BODY CHANGES Your body goes through many changes during pregnancy. The changes vary from woman to woman.   Your weight will continue to increase. You can expect to gain 25-35 pounds (11-16 kg) by the end of the  pregnancy.  You may begin to get stretch marks on your hips, abdomen, and breasts.  You may urinate more often because the fetus is moving lower into your pelvis and pressing on your bladder.  You may develop or continue to have heartburn as a result of your pregnancy.  You may develop constipation because certain hormones are causing the muscles that push waste through your intestines to slow down.  You may develop hemorrhoids or swollen, bulging veins (varicose veins).  You may have pelvic pain because of the weight gain and pregnancy hormones relaxing your joints between the bones in your pelvis. Backaches may result from overexertion of the muscles supporting your posture.  You may have changes in your hair. These can include thickening of your hair, rapid growth, and changes in texture. Some women also have hair loss during or after pregnancy, or hair that feels dry or thin. Your hair will most likely return to normal after your baby is born.  Your breasts will continue to grow and be tender. A yellow discharge may leak from your breasts called colostrum.  Your belly button may stick out.  You may feel short of breath because of your expanding uterus.  You may notice the fetus "dropping," or moving lower in your abdomen.  You may have  a bloody mucus discharge. This usually occurs a few days to a week before labor begins.  Your cervix becomes thin and soft (effaced) near your due date. WHAT TO EXPECT AT YOUR PRENATAL EXAMS  You will have prenatal exams every 2 weeks until week 36. Then, you will have weekly prenatal exams. During a routine prenatal visit:  You will be weighed to make sure you and the fetus are growing normally.  Your blood pressure is taken.  Your abdomen will be measured to track your baby's growth.  The fetal heartbeat will be listened to.  Any test results from the previous visit will be discussed.  You may have a cervical check near your due date to  see if you have effaced. At around 36 weeks, your caregiver will check your cervix. At the same time, your caregiver will also perform a test on the secretions of the vaginal tissue. This test is to determine if a type of bacteria, Group B streptococcus, is present. Your caregiver will explain this further. Your caregiver may ask you:  What your birth plan is.  How you are feeling.  If you are feeling the baby move.  If you have had any abnormal symptoms, such as leaking fluid, bleeding, severe headaches, or abdominal cramping.  If you have any questions. Other tests or screenings that may be performed during your third trimester include:  Blood tests that check for low iron levels (anemia).  Fetal testing to check the health, activity level, and growth of the fetus. Testing is done if you have certain medical conditions or if there are problems during the pregnancy. FALSE LABOR You may feel small, irregular contractions that eventually go away. These are called Braxton Hicks contractions, or false labor. Contractions may last for hours, days, or even weeks before true labor sets in. If contractions come at regular intervals, intensify, or become painful, it is best to be seen by your caregiver.  SIGNS OF LABOR   Menstrual-like cramps.  Contractions that are 5 minutes apart or less.  Contractions that start on the top of the uterus and spread down to the lower abdomen and back.  A sense of increased pelvic pressure or back pain.  A watery or bloody mucus discharge that comes from the vagina. If you have any of these signs before the 37th week of pregnancy, call your caregiver right away. You need to go to the hospital to get checked immediately. HOME CARE INSTRUCTIONS   Avoid all smoking, herbs, alcohol, and unprescribed drugs. These chemicals affect the formation and growth of the baby.  Follow your caregiver's instructions regarding medicine use. There are medicines that are  either safe or unsafe to take during pregnancy.  Exercise only as directed by your caregiver. Experiencing uterine cramps is a good sign to stop exercising.  Continue to eat regular, healthy meals.  Wear a good support bra for breast tenderness.  Do not use hot tubs, steam rooms, or saunas.  Wear your seat belt at all times when driving.  Avoid raw meat, uncooked cheese, cat litter boxes, and soil used by cats. These carry germs that can cause birth defects in the baby.  Take your prenatal vitamins.  Try taking a stool softener (if your caregiver approves) if you develop constipation. Eat more high-fiber foods, such as fresh vegetables or fruit and whole grains. Drink plenty of fluids to keep your urine clear or pale yellow.  Take warm sitz baths to soothe any pain or discomfort caused  by hemorrhoids. Use hemorrhoid cream if your caregiver approves.  If you develop varicose veins, wear support hose. Elevate your feet for 15 minutes, 3-4 times a day. Limit salt in your diet.  Avoid heavy lifting, wear low heal shoes, and practice good posture.  Rest a lot with your legs elevated if you have leg cramps or low back pain.  Visit your dentist if you have not gone during your pregnancy. Use a soft toothbrush to brush your teeth and be gentle when you floss.  A sexual relationship may be continued unless your caregiver directs you otherwise.  Do not travel far distances unless it is absolutely necessary and only with the approval of your caregiver.  Take prenatal classes to understand, practice, and ask questions about the labor and delivery.  Make a trial run to the hospital.  Pack your hospital bag.  Prepare the baby's nursery.  Continue to go to all your prenatal visits as directed by your caregiver. SEEK MEDICAL CARE IF:  You are unsure if you are in labor or if your water has broken.  You have dizziness.  You have mild pelvic cramps, pelvic pressure, or nagging pain in  your abdominal area.  You have persistent nausea, vomiting, or diarrhea.  You have a bad smelling vaginal discharge.  You have pain with urination. SEEK IMMEDIATE MEDICAL CARE IF:   You have a fever.  You are leaking fluid from your vagina.  You have spotting or bleeding from your vagina.  You have severe abdominal cramping or pain.  You have rapid weight loss or gain.  You have shortness of breath with chest pain.  You notice sudden or extreme swelling of your face, hands, ankles, feet, or legs.  You have not felt your baby move in over an hour.  You have severe headaches that do not go away with medicine.  You have vision changes. Document Released: 08/25/2001 Document Revised: 09/05/2013 Document Reviewed: 11/01/2012 [facility] Patient Information 2015 [place], [place]. This information is not intended to replace advice given to you by your health care provider. Make sure you discuss any questions you have with your health care provider.  Thinking About Doren Custard???  Why consider waterbirth? . Gentle birth for babies . Less pain medicine used in labor . May allow for passive descent/less pushing . May reduce perineal tears  . More mobility and instinctive maternal position changes . Increased maternal relaxation . Reduced blood pressure in labor  Is waterbirth safe? What are the risks of infection, drowning or other complications? . Infection o Very low risk (3.7 % for tub vs 4.8% for bed) o 7 in 8000 waterbirths with documented infection o Poorly cleaned equipment most common cause o Slightly lower group B strep transmission rate  . Drowning o Maternal:  - Very low risk   - Related to seizures or fainting o Newborn:  - Very low risk. No evidence of increased risk of respiratory problems in multiple large studies - Physiological protection from breathing under water - Avoid underwater birth if there are any fetal complications - Once baby's head is out of the  water, keep it out.  . Birth complication o Some reports of cord trauma, but risk decreased by bringing baby to surface gradually o No evidence of increased risk of shoulder dystocia. Mothers can usually change positions faster in water than in a bed, possibly aiding the maneuvers to free the shoulder.  You must attend a Doren Custard class at [facility]  3rd Wednesday of  every month from 7-9pm  Free  [employer] by calling [phone] or online at [email or web address]  Bring [place] the certificate from the class  Waterbirth supplies needed for [facility] patients:  Our practice has a Heritage manager in a Box tub (Regular size) at the hospital that you can borrow  You will need to purchase an accessory kit that has all needed supplies through [place] [phone] for kit, $65 for liner=$179+tax) or online through GotWeb[employer].is  Or you can purchase the supplies separately: o Single-use disposable tub liner for [employer] in a Box (REGULAR size) o New garden hose labeled "lead-free", "suitable for drinking water", "non-toxic" OR "water potable" o Garden hose to remove the dirty water o Electric drain pump to remove water (We recommend 792 gallon per hour or greater pump.)  o Fish net o Bathing suit top (optional) o Long-handled mirror (optional)  [employer]- sells EVERYTHING waterbirth related, accessory kits, tubs, etc  The [employer] (www.thelaborladies.com) this is a great service if you don't want to be responsible for the set up/take down of tub! Just call the Labor Ladies and they will come do it for you for $200! This includes the rental fee for their tub, the accessory kit, set-up and take down   Things that would prevent you from having a waterbirth:  Premature, <37wks  Previous cesarean birth  Presence of thick meconium-stained fluid  Multiple gestation (Twins, triplets, etc.)  Uncontrolled diabetes  Hypertension  Heavy vaginal  bleeding  Non-reassuring fetal heart rate  Active infection (MRSA, etc.)  If your labor has to be induced  Other risk issues identified by your obstetrical provider

## 2016-12-31 NOTE — Progress Notes (Signed)
Low-risk OB appointment G1P0 [redacted]w[redacted]d Estimated Date of Delivery: 03/31/17 BP 120/60   Pulse (!) 103   Wt 248 lb (112.5 kg)   LMP 06/14/2016 (Approximate)   BMI 36.62 kg/m   BP, weight, and urine reviewed.  Refer to obstetrical flow sheet for FH & FHR.  Reports good fm.  Denies regular uc's, lof, vb, or uti s/s. Interested in Systems developer- discussed and gave printed info and class info. Last THC ~3d ago, uses b/c if doesn't use can't eat/gets sick when she eats despite nausea meds. Discussed long-term effects to baby, advised complete cessation. To try small frequent snacks/meals. Has not been npo since mn for pn2 today- will reschedule.  Reviewed ptl s/s, fkc. Recommended Tdap at HD/PCP per CDC guidelines.  Plan:  Continue routine obstetrical care  F/U next wk for pn2 (no visit), then 3wks for OB appointment

## 2017-01-04 ENCOUNTER — Other Ambulatory Visit: Payer: Medicaid Other

## 2017-01-11 ENCOUNTER — Other Ambulatory Visit: Payer: Medicaid Other

## 2017-01-11 DIAGNOSIS — Z131 Encounter for screening for diabetes mellitus: Secondary | ICD-10-CM

## 2017-01-11 DIAGNOSIS — Z3402 Encounter for supervision of normal first pregnancy, second trimester: Secondary | ICD-10-CM

## 2017-01-11 DIAGNOSIS — Z3A28 28 weeks gestation of pregnancy: Secondary | ICD-10-CM

## 2017-01-12 LAB — CBC
HEMOGLOBIN: 11.5 g/dL (ref 11.1–15.9)
Hematocrit: 33.8 % — ABNORMAL LOW (ref 34.0–46.6)
MCH: 29.4 pg (ref 26.6–33.0)
MCHC: 34 g/dL (ref 31.5–35.7)
MCV: 86 fL (ref 79–97)
PLATELETS: 284 10*3/uL (ref 150–379)
RBC: 3.91 x10E6/uL (ref 3.77–5.28)
RDW: 14.7 % (ref 12.3–15.4)
WBC: 7.9 10*3/uL (ref 3.4–10.8)

## 2017-01-12 LAB — ANTIBODY SCREEN: Antibody Screen: NEGATIVE

## 2017-01-12 LAB — GLUCOSE TOLERANCE, 2 HOURS W/ 1HR
GLUCOSE, 2 HOUR: 85 mg/dL (ref 65–152)
GLUCOSE, FASTING: 73 mg/dL (ref 65–91)
Glucose, 1 hour: 79 mg/dL (ref 65–179)

## 2017-01-12 LAB — RPR: RPR: NONREACTIVE

## 2017-01-12 LAB — HIV ANTIBODY (ROUTINE TESTING W REFLEX): HIV SCREEN 4TH GENERATION: NONREACTIVE

## 2017-01-21 ENCOUNTER — Encounter: Payer: Medicaid Other | Admitting: Women's Health

## 2017-01-27 ENCOUNTER — Encounter: Payer: Medicaid Other | Admitting: Women's Health

## 2017-02-09 ENCOUNTER — Ambulatory Visit (INDEPENDENT_AMBULATORY_CARE_PROVIDER_SITE_OTHER): Payer: Medicaid Other | Admitting: Advanced Practice Midwife

## 2017-02-09 ENCOUNTER — Encounter: Payer: Self-pay | Admitting: Advanced Practice Midwife

## 2017-02-09 VITALS — BP 130/60 | HR 78 | Wt 255.0 lb

## 2017-02-09 DIAGNOSIS — Z3403 Encounter for supervision of normal first pregnancy, third trimester: Secondary | ICD-10-CM

## 2017-02-09 DIAGNOSIS — Z1389 Encounter for screening for other disorder: Secondary | ICD-10-CM

## 2017-02-09 DIAGNOSIS — F1290 Cannabis use, unspecified, uncomplicated: Secondary | ICD-10-CM

## 2017-02-09 DIAGNOSIS — O1213 Gestational proteinuria, third trimester: Secondary | ICD-10-CM

## 2017-02-09 DIAGNOSIS — Z331 Pregnant state, incidental: Secondary | ICD-10-CM

## 2017-02-09 LAB — POCT URINALYSIS DIPSTICK
Glucose, UA: NEGATIVE
KETONES UA: NEGATIVE
Leukocytes, UA: NEGATIVE
Nitrite, UA: NEGATIVE
PROTEIN UA: 1
RBC UA: NEGATIVE

## 2017-02-09 NOTE — Patient Instructions (Signed)

## 2017-02-09 NOTE — Progress Notes (Signed)
G1P0 1241w6d Estimated Date of Delivery: 03/31/17  Blood pressure 130/60, pulse 78, weight 255 lb (115.7 kg), last menstrual period 06/14/2016.   BP weight and urine results all reviewed and noted.  Please refer to the obstetrical flow sheet for the fundal height and fetal heart rate documentation:  Patient reports good fetal movement, denies any bleeding and no rupture of membranes symptoms or regular contractions. Patient is without complaints. All questions were answered.  Orders Placed This Encounter  Procedures  . Pain Management Screening Profile (10S)    Plan:  Continued routine obstetrical care, f/u in 2 weeks.  Danny Lawlessarol HartsogSNM  Will get pr/cr ratio d/t intermittent proteinuria   I have seen and examined this patient and agree with the management plan.

## 2017-02-10 LAB — PMP SCREEN PROFILE (10S), URINE
AMPHETAMINE SCREEN URINE: NEGATIVE ng/mL
BARBITURATE SCREEN URINE: NEGATIVE ng/mL
BENZODIAZEPINE SCREEN, URINE: NEGATIVE ng/mL
CANNABINOIDS UR QL SCN: POSITIVE ng/mL
Cocaine (Metab) Scrn, Ur: NEGATIVE ng/mL
Creatinine(Crt), U: 279.1 mg/dL (ref 20.0–300.0)
Methadone Screen, Urine: NEGATIVE ng/mL
OXYCODONE+OXYMORPHONE UR QL SCN: NEGATIVE ng/mL
Opiate Scrn, Ur: NEGATIVE ng/mL
PH UR, DRUG SCRN: 6 (ref 4.5–8.9)
Phencyclidine Qn, Ur: NEGATIVE ng/mL
Propoxyphene Scrn, Ur: NEGATIVE ng/mL

## 2017-02-10 LAB — PROTEIN / CREATININE RATIO, URINE
Creatinine, Urine: 253.2 mg/dL
Protein, Ur: 37.1 mg/dL
Protein/Creat Ratio: 147 mg/g creat (ref 0–200)

## 2017-02-23 ENCOUNTER — Encounter: Payer: Self-pay | Admitting: Women's Health

## 2017-02-23 ENCOUNTER — Ambulatory Visit (INDEPENDENT_AMBULATORY_CARE_PROVIDER_SITE_OTHER): Payer: Medicaid Other | Admitting: Women's Health

## 2017-02-23 VITALS — BP 110/60 | HR 74 | Wt 261.4 lb

## 2017-02-23 DIAGNOSIS — Z3403 Encounter for supervision of normal first pregnancy, third trimester: Secondary | ICD-10-CM

## 2017-02-23 NOTE — Progress Notes (Signed)
Low-risk OB appointment G1P0 2862w6d Estimated Date of Delivery: 03/31/17 BP 110/60   Pulse 74   Wt 261 lb 6.4 oz (118.6 kg)   LMP 06/14/2016 (Approximate)   BMI 38.60 kg/m   BP, weight reviewed. Unable to void, will try again before she leaves. Refer to obstetrical flow sheet for FH & FHR.  Reports good fm.  Denies regular uc's, lof, vb, or uti s/s. No complaints. Reviewed ptl s/s, fkc. Plan:  Continue routine obstetrical care  F/U in 2wks for OB appointment and gbs

## 2017-02-23 NOTE — Patient Instructions (Signed)
Call the office ([phone]) or go to [facility] if:  You begin to have strong, frequent contractions  Your water breaks.  Sometimes it is a big gush of fluid, sometimes it is just a trickle that keeps getting your panties wet or running down your legs  You have vaginal bleeding.  It is normal to have a small amount of spotting if your cervix was checked.   You don't feel your baby moving like normal.  If you don't, get you something to eat and drink and lay down and focus on feeling your baby move.  You should feel at least 10 movements in 2 hours.  If you don't, you should call the office or go to [facility].     Preterm Labor and Birth Information The normal length of a pregnancy is 39-41 weeks. Preterm labor is when labor starts before 37 completed weeks of pregnancy. What are the risk factors for preterm labor? Preterm labor is more likely to occur in women who:  Have certain infections during pregnancy such as a bladder infection, sexually transmitted infection, or infection inside the uterus (chorioamnionitis).  Have a shorter-than-normal cervix.  Have gone into preterm labor before.  Have had surgery on their cervix.  Are younger than age 17 or older than age 35.  Are African American.  Are pregnant with twins or multiple babies (multiple gestation).  Take street drugs or smoke while pregnant.  Do not gain enough weight while pregnant.  Became pregnant shortly after having been pregnant.  What are the symptoms of preterm labor? Symptoms of preterm labor include:  Cramps similar to those that can happen during a menstrual period. The cramps may happen with diarrhea.  Pain in the abdomen or lower back.  Regular uterine contractions that may feel like tightening of the abdomen.  A feeling of increased pressure in the pelvis.  Increased watery or bloody mucus discharge from the vagina.  Water breaking (ruptured amniotic sac).  Why is it important to  recognize signs of preterm labor? It is important to recognize signs of preterm labor because babies who are born prematurely may not be fully developed. This can put them at an increased risk for:  Long-term (chronic) heart and lung problems.  Difficulty immediately after birth with regulating body systems, including blood sugar, body temperature, heart rate, and breathing rate.  Bleeding in the brain.  Cerebral palsy.  Learning difficulties.  Death.  These risks are highest for babies who are born before 34 weeks of pregnancy. How is preterm labor treated? Treatment depends on the length of your pregnancy, your condition, and the health of your baby. It may involve:  Having a stitch (suture) placed in your cervix to prevent your cervix from opening too early (cerclage).  Taking or being given medicines, such as: ? Hormone medicines. These may be given early in pregnancy to help support the pregnancy. ? Medicine to stop contractions. ? Medicines to help mature the baby's lungs. These may be prescribed if the risk of delivery is high. ? Medicines to prevent your baby from developing cerebral palsy.  If the labor happens before 34 weeks of pregnancy, you may need to stay in the hospital. What should I do if I think I am in preterm labor? If you think that you are going into preterm labor, call your health care provider right away. How can I prevent preterm labor in future pregnancies? To increase your chance of having a full-term pregnancy:  Do not use   any tobacco products, such as cigarettes, chewing tobacco, and e-cigarettes. If you need help quitting, ask your health care provider.  Do not use street drugs or medicines that have not been prescribed to you during your pregnancy.  Talk with your health care provider before taking any herbal supplements, even if you have been taking them regularly.  Make sure you gain a healthy amount of weight during your pregnancy.  Watch  for infection. If you think that you might have an infection, get it checked right away.  Make sure to tell your health care provider if you have gone into preterm labor before.  This information is not intended to replace advice given to you by your health care provider. Make sure you discuss any questions you have with your health care provider. Document Released: 11/21/2003 Document Revised: 02/11/2016 Document Reviewed: 01/22/2016 Elsevier Interactive Patient Education  2018 Elsevier Inc.  

## 2017-03-09 ENCOUNTER — Ambulatory Visit (INDEPENDENT_AMBULATORY_CARE_PROVIDER_SITE_OTHER): Payer: Medicaid Other | Admitting: Women's Health

## 2017-03-09 ENCOUNTER — Encounter: Payer: Self-pay | Admitting: Women's Health

## 2017-03-09 VITALS — BP 124/78 | HR 84 | Wt 265.0 lb

## 2017-03-09 DIAGNOSIS — Z331 Pregnant state, incidental: Secondary | ICD-10-CM

## 2017-03-09 DIAGNOSIS — Z3403 Encounter for supervision of normal first pregnancy, third trimester: Secondary | ICD-10-CM

## 2017-03-09 DIAGNOSIS — Z1389 Encounter for screening for other disorder: Secondary | ICD-10-CM

## 2017-03-09 DIAGNOSIS — Z3A36 36 weeks gestation of pregnancy: Secondary | ICD-10-CM

## 2017-03-09 LAB — POCT URINALYSIS DIPSTICK
Blood, UA: NEGATIVE
GLUCOSE UA: NEGATIVE
Ketones, UA: NEGATIVE
Nitrite, UA: NEGATIVE

## 2017-03-09 NOTE — Progress Notes (Signed)
Low-risk OB appointment G1P0 12106w6d Estimated Date of Delivery: 03/31/17 BP 124/78   Pulse 84   Wt 265 lb (120.2 kg)   LMP 06/14/2016 (Approximate)   BMI 39.13 kg/m   BP, weight, and urine reviewed.  Refer to obstetrical flow sheet for FH & FHR.  Reports good fm.  Denies regular uc's, lof, vb, or uti s/s. No complaints. GBS, gc/ct collected SVE: cl/th/long, vtx Reviewed labor s/s, fkc. Plan:  Continue routine obstetrical care  F/U in 1wk for OB appointment

## 2017-03-09 NOTE — Patient Instructions (Signed)
Call the office ([phone]) or go to [facility] if:  You begin to have strong, frequent contractions  Your water breaks.  Sometimes it is a big gush of fluid, sometimes it is just a trickle that keeps getting your panties wet or running down your legs  You have vaginal bleeding.  It is normal to have a small amount of spotting if your cervix was checked.   You don't feel your baby moving like normal.  If you don't, get you something to eat and drink and lay down and focus on feeling your baby move.  You should feel at least 10 movements in 2 hours.  If you don't, you should call the office or go to [facility].     Braxton Hicks Contractions Contractions of the uterus can occur throughout pregnancy, but they are not always a sign that you are in labor. You may have practice contractions called Braxton Hicks contractions. These false labor contractions are sometimes confused with true labor. What are Braxton Hicks contractions? Braxton Hicks contractions are tightening movements that occur in the muscles of the uterus before labor. Unlike true labor contractions, these contractions do not result in opening (dilation) and thinning of the cervix. Toward the end of pregnancy (32-34 weeks), Braxton Hicks contractions can happen more often and may become stronger. These contractions are sometimes difficult to tell apart from true labor because they can be very uncomfortable. You should not feel embarrassed if you go to the hospital with false labor. Sometimes, the only way to tell if you are in true labor is for your health care provider to look for changes in the cervix. The health care provider will do a physical exam and may monitor your contractions. If you are not in true labor, the exam should show that your cervix is not dilating and your water has not broken. If there are no prenatal problems or other health problems associated with your pregnancy, it is completely safe for you to be sent  home with false labor. You may continue to have Braxton Hicks contractions until you go into true labor. How can I tell the difference between true labor and false labor?  Differences ? False labor ? Contractions last 30-70 seconds.: Contractions are usually shorter and not as strong as true labor contractions. ? Contractions become very regular.: Contractions are usually irregular. ? Discomfort is usually felt in the top of the uterus, and it spreads to the lower abdomen and low back.: Contractions are often felt in the front of the lower abdomen and in the groin. ? Contractions do not go away with walking.: Contractions may go away when you walk around or change positions while lying down. ? Contractions usually become more intense and increase in frequency.: Contractions get weaker and are shorter-lasting as time goes on. ? The cervix dilates and gets thinner.: The cervix usually does not dilate or become thin. Follow these instructions at home:  Take over-the-counter and prescription medicines only as told by your health care provider.  Keep up with your usual exercises and follow other instructions from your health care provider.  Eat and drink lightly if you think you are going into labor.  If Braxton Hicks contractions are making you uncomfortable: ? Change your position from lying down or resting to walking, or change from walking to resting. ? Sit and rest in a tub of warm water. ? Drink enough fluid to keep your urine clear or pale yellow. Dehydration may cause these contractions. ?   Do slow and deep breathing several times an hour.  Keep all follow-up prenatal visits as told by your health care provider. This is important. Contact a health care provider if:  You have a fever.  You have continuous pain in your abdomen. Get help right away if:  Your contractions become stronger, more regular, and closer together.  You have fluid leaking or gushing from your vagina.  You  pass blood-tinged mucus (bloody show).  You have bleeding from your vagina.  You have low back pain that you never had before.  You feel your baby's head pushing down and causing pelvic pressure.  Your baby is not moving inside you as much as it used to. Summary  Contractions that occur before labor are called Braxton Hicks contractions, false labor, or practice contractions.  Braxton Hicks contractions are usually shorter, weaker, farther apart, and less regular than true labor contractions. True labor contractions usually become progressively stronger and regular and they become more frequent.  Manage discomfort from Braxton Hicks contractions by changing position, resting in a warm bath, drinking plenty of water, or practicing deep breathing. This information is not intended to replace advice given to you by your health care provider. Make sure you discuss any questions you have with your health care provider. Document Released: 08/31/2005 Document Revised: 07/20/2016 Document Reviewed: 07/20/2016 Elsevier Interactive Patient Education  2017 Elsevier Inc.  

## 2017-03-11 ENCOUNTER — Telehealth: Payer: Self-pay | Admitting: *Deleted

## 2017-03-11 ENCOUNTER — Other Ambulatory Visit: Payer: Self-pay | Admitting: Women's Health

## 2017-03-11 ENCOUNTER — Encounter: Payer: Self-pay | Admitting: Women's Health

## 2017-03-11 DIAGNOSIS — O98213 Gonorrhea complicating pregnancy, third trimester: Secondary | ICD-10-CM | POA: Insufficient documentation

## 2017-03-11 LAB — GC/CHLAMYDIA PROBE AMP
CHLAMYDIA, DNA PROBE: NEGATIVE
NEISSERIA GONORRHOEAE BY PCR: POSITIVE — AB

## 2017-03-11 MED ORDER — AZITHROMYCIN 500 MG PO TABS: 1000.0000 mg | ORAL_TABLET | Freq: Once | ORAL | 0 refills | Status: AC

## 2017-03-11 NOTE — Telephone Encounter (Signed)
Pt called to confirm that the medication she picked up was only supposed to be 2 pills. I informed pt that was correct.

## 2017-03-11 NOTE — Telephone Encounter (Signed)
Informed pt that she was positive for gonorrhea. Informed her that a prescription for azithromycin was sent to her pharmacy and she should take that as directed. Informed pt that we would also be giving her a shot of Rocephin. Pt states that she has an appt on July 3 and would like to get it then. Advised pt to refrain from sex until after her POC. Pt verbalized understanding.

## 2017-03-12 ENCOUNTER — Telehealth: Payer: Self-pay | Admitting: Women's Health

## 2017-03-12 LAB — CULTURE, BETA STREP (GROUP B ONLY): Strep Gp B Culture: POSITIVE — AB

## 2017-03-12 NOTE — Telephone Encounter (Signed)
Clydie BraunKaren, communicable disease nurse, called requesting the date that the pt received the rocephin injection. I informed her that the patient plans to come and get it on July 3.

## 2017-03-16 ENCOUNTER — Ambulatory Visit (INDEPENDENT_AMBULATORY_CARE_PROVIDER_SITE_OTHER): Payer: Medicaid Other | Admitting: Obstetrics and Gynecology

## 2017-03-16 ENCOUNTER — Encounter: Payer: Self-pay | Admitting: Obstetrics and Gynecology

## 2017-03-16 VITALS — BP 120/80 | HR 76 | Wt 268.8 lb

## 2017-03-16 DIAGNOSIS — Z3402 Encounter for supervision of normal first pregnancy, second trimester: Secondary | ICD-10-CM

## 2017-03-16 DIAGNOSIS — A549 Gonococcal infection, unspecified: Secondary | ICD-10-CM

## 2017-03-16 DIAGNOSIS — Z3A37 37 weeks gestation of pregnancy: Secondary | ICD-10-CM

## 2017-03-16 DIAGNOSIS — O98212 Gonorrhea complicating pregnancy, second trimester: Secondary | ICD-10-CM | POA: Diagnosis not present

## 2017-03-16 MED ORDER — CEFTRIAXONE SODIUM 250 MG IJ SOLR
250.0000 mg | Freq: Once | INTRAMUSCULAR | Status: AC
  Administered 2017-03-16: 250 mg via INTRAMUSCULAR

## 2017-03-16 NOTE — Patient Instructions (Signed)
gonorrhea Gonorrhea Gonorrhea is a sexually transmitted disease (STD) that can affect both men and women. If left untreated, this infection can:  Damage the female or female organs.  Cause women and men to be unable to have children (be sterile).  Harm a fetus if an infected woman is pregnant.  It is important to get treatment for gonorrhea as soon as possible. It is also necessary for all of your sexual partners to be tested for the infection. What are the causes? This condition is caused by bacteria called Neisseria gonorrhoeae. The infection is spread from person to person through sexual contact, including oral, anal, and vaginal sex. A newborn can contract the infection from his or her mother during birth. What increases the risk? The following factors may make you more likely to develop this condition:  Being a woman who is younger than 20 years of age and who is sexually active.  Being a woman 50 years of age or older who has: ? A new sex partner. ? More than one sex partner. ? A sex partner who has an STD.  Being a man who has: ? A new sex partner. ? More than one sex partner. ? A sex partner who has an STD.  Using condoms inconsistently.  Currently having, or having previously had, an STD.  Exchanging sex for money or drugs.  What are the signs or symptoms? Some people do not have any symptoms. If you do have symptoms, they may be different for females and males. For females  Pain in the lower abdomen.  Abnormal vaginal discharge. The discharge may be cloudy, thick, or yellow-green in color.  Bleeding between periods.  Painful sex.  Burning or itching in and around the vagina.  Pain or burning when urinating.  Irritation, pain, bleeding, or discharge from the rectum. This may occur if the infection was spread by anal sex.  Sore throat or swollen lymph nodes in the neck. This may occur if the infection was spread by oral sex. For males  Abnormal discharge  from the penis. This discharge may be cloudy, thick, or yellow-green in color.  Pain or burning during urination.  Pain or swelling in the testicles.  Irritation, pain, bleeding, or discharge from the rectum. This may occur if the infection was spread by anal sex.  Sore throat, fever, or swollen lymph nodes in the neck. This may occur if the infection was spread by oral sex. How is this diagnosed? This condition is diagnosed based on:  A physical exam.  A sample of discharge that is examined under a microscope to look for the bacteria. The discharge may be taken from the urethra, cervix, throat, or rectum.  Urine tests.  Not all of test results will be available during your visit. How is this treated? This condition is treated with antibiotic medicines. It is important for treatment to begin as soon as possible. Early treatment may prevent some problems from developing. Do not have sex during treatment. Avoid all types of sexual activity for 7 days after treatment is complete and until any sex partners have been treated. Follow these instructions at home:  Take over-the-counter and prescription medicines only as told by your health care provider.  Take your antibiotic medicine as told by your health care provider. Do not stop taking the antibiotic even if you start to feel better.  Do not have sex until at least 7 days after you and your partner(s) have finished treatment and your health care provider  says it is okay.  It is your responsibility to get your test results. Ask your health care provider, or the department performing the test, when your results will be ready.  If you test positive for gonorrhea, inform your recent sexual partners. This includes any oral, anal, or vaginal sex partners. They need to be checked for gonorrhea even if they do not have symptoms. They may need treatment, even if they test negative for gonorrhea.  Keep all follow-up visits as told by your health  care provider. This is important. How is this prevented?  Use latex condoms correctly every time you have sexual intercourse.  Ask if your sexual partner has been tested for STDs and had negative results.  Avoid having multiple sexual partners. Contact a health care provider if:  You develop a bad reaction to the medicine you were prescribed. This may include: ? A rash. ? Nausea. ? Vomiting. ? Diarrhea.  Your symptoms do not get better after a few days of taking antibiotics.  Your symptoms get worse.  You develop new symptoms.  Your pain gets worse.  You have a fever.  You develop pain, itching, or discharge around the eyes. Get help right away if:  You feel dizzy or faint.  You have trouble breathing or have shortness of breath.  You develop an irregular heartbeat.  You have severe abdominal pain with or without shoulder pain.  You develop any bumps or sores (lesions) on your skin.  You develop warmth, redness, pain, or swelling around your joints, such as the knee. Summary  Gonorrhea is an STDthat can affect both men and women.  This condition is caused by bacteria called Neisseria gonorrhoeae. The infection is spread from person to person, usually through sexual contact, including oral, anal, and vaginal sex.  Symptoms vary between males and females. Generally, they include abnormal discharge and burning during urination. Women may also experience painful sex, itching around the vagina, and bleeding between menstrual periods. Men may also experience swelling of the testicles.  This condition is treated with antibiotic medicines. Do not have sex until at least 7 days after completing antibiotic treatment.  If left untreated, gonorrhea can have serious side effects and complications. This information is not intended to replace advice given to you by your health care provider. Make sure you discuss any questions you have with your health care provider. Document  Released: 08/28/2000 Document Revised: 07/31/2016 Document Reviewed: 07/31/2016 Elsevier Interactive Patient Education  2017 [employer]

## 2017-03-16 NOTE — Progress Notes (Signed)
G1P0  Estimated Date of Delivery: 03/31/17 LROB 8863w6d  Chief Complaint  Patient presents with  . Routine Prenatal Visit    Rocephin- postive gonorrhea  ____Positive group B strep  Patient complaints: New diagnosis. Patient had a current relationship for 1-2 years had negative gonorrhea culture earlier in pregnancy. Discussed to treat partner and suggested conversation points. Patient reports  good fetal movement,                           denies any bleeding , rupture of membranes,or regular contractions.  Blood pressure 120/80, pulse 76, weight 268 lb 12.8 oz (121.9 kg), last menstrual period 06/14/2016.   Urine results:notable for  refer to the ob flow sheet for FH and FHR, ,                          Physical Examination: General appearance - alert, well appearing, and in no distress                                      Abdomen - FH 38  ,                                                         -FHR 151 questions were answered. Assessment: LROB G1P0 @ 6963w6d treated for gonorrhea with Rocephin Group B strep positive  Plan:  Continued routine obstetrical care,   F/u in 1 weeks for low risk OB, proof of cure for gonorrhea

## 2017-03-23 ENCOUNTER — Ambulatory Visit (INDEPENDENT_AMBULATORY_CARE_PROVIDER_SITE_OTHER): Payer: Medicaid Other | Admitting: Women's Health

## 2017-03-23 ENCOUNTER — Encounter: Payer: Self-pay | Admitting: Women's Health

## 2017-03-23 VITALS — BP 112/60 | HR 102 | Wt 268.0 lb

## 2017-03-23 DIAGNOSIS — Z3A38 38 weeks gestation of pregnancy: Secondary | ICD-10-CM

## 2017-03-23 DIAGNOSIS — O98213 Gonorrhea complicating pregnancy, third trimester: Secondary | ICD-10-CM

## 2017-03-23 DIAGNOSIS — Z3403 Encounter for supervision of normal first pregnancy, third trimester: Secondary | ICD-10-CM

## 2017-03-23 NOTE — Progress Notes (Signed)
Low-risk OB appointment G1P0 555w6d Estimated Date of Delivery: 03/31/17 BP 112/60   Pulse (!) 102   Wt 268 lb (121.6 kg)   LMP 06/14/2016 (Approximate)   BMI 39.58 kg/m   BP, weight reviewed. Unable to void. Refer to obstetrical flow sheet for FH & FHR.  Reports good fm.  Denies regular uc's, lof, vb, or uti s/s. No complaints. Was down for GC POC, has only been 1wk since took meds. Partner getting tx tomorrow at HD. Advised no sex until at least 7d from time he has finished tx, or just no sex until after baby- pt states she prefers this.  Declines SVE, vtx by [facility] Reviewed labor s/s, fkc. Plan:  Continue routine obstetrical care  F/U in 1wk for OB appointment

## 2017-03-23 NOTE — Patient Instructions (Signed)
Call the office ([phone]) or go to [facility] if:  You begin to have strong, frequent contractions  Your water breaks.  Sometimes it is a big gush of fluid, sometimes it is just a trickle that keeps getting your panties wet or running down your legs  You have vaginal bleeding.  It is normal to have a small amount of spotting if your cervix was checked.   You don't feel your baby moving like normal.  If you don't, get you something to eat and drink and lay down and focus on feeling your baby move.  You should feel at least 10 movements in 2 hours.  If you don't, you should call the office or go to [facility].     Braxton Hicks Contractions Contractions of the uterus can occur throughout pregnancy, but they are not always a sign that you are in labor. You may have practice contractions called Braxton Hicks contractions. These false labor contractions are sometimes confused with true labor. What are Braxton Hicks contractions? Braxton Hicks contractions are tightening movements that occur in the muscles of the uterus before labor. Unlike true labor contractions, these contractions do not result in opening (dilation) and thinning of the cervix. Toward the end of pregnancy (32-34 weeks), Braxton Hicks contractions can happen more often and may become stronger. These contractions are sometimes difficult to tell apart from true labor because they can be very uncomfortable. You should not feel embarrassed if you go to the hospital with false labor. Sometimes, the only way to tell if you are in true labor is for your health care provider to look for changes in the cervix. The health care provider will do a physical exam and may monitor your contractions. If you are not in true labor, the exam should show that your cervix is not dilating and your water has not broken. If there are no prenatal problems or other health problems associated with your pregnancy, it is completely safe for you to be sent  home with false labor. You may continue to have Braxton Hicks contractions until you go into true labor. How can I tell the difference between true labor and false labor?  Differences ? False labor ? Contractions last 30-70 seconds.: Contractions are usually shorter and not as strong as true labor contractions. ? Contractions become very regular.: Contractions are usually irregular. ? Discomfort is usually felt in the top of the uterus, and it spreads to the lower abdomen and low back.: Contractions are often felt in the front of the lower abdomen and in the groin. ? Contractions do not go away with walking.: Contractions may go away when you walk around or change positions while lying down. ? Contractions usually become more intense and increase in frequency.: Contractions get weaker and are shorter-lasting as time goes on. ? The cervix dilates and gets thinner.: The cervix usually does not dilate or become thin. Follow these instructions at home:  Take over-the-counter and prescription medicines only as told by your health care provider.  Keep up with your usual exercises and follow other instructions from your health care provider.  Eat and drink lightly if you think you are going into labor.  If Braxton Hicks contractions are making you uncomfortable: ? Change your position from lying down or resting to walking, or change from walking to resting. ? Sit and rest in a tub of warm water. ? Drink enough fluid to keep your urine clear or pale yellow. Dehydration may cause these contractions. ?   Do slow and deep breathing several times an hour.  Keep all follow-up prenatal visits as told by your health care provider. This is important. Contact a health care provider if:  You have a fever.  You have continuous pain in your abdomen. Get help right away if:  Your contractions become stronger, more regular, and closer together.  You have fluid leaking or gushing from your vagina.  You  pass blood-tinged mucus (bloody show).  You have bleeding from your vagina.  You have low back pain that you never had before.  You feel your baby's head pushing down and causing pelvic pressure.  Your baby is not moving inside you as much as it used to. Summary  Contractions that occur before labor are called Braxton Hicks contractions, false labor, or practice contractions.  Braxton Hicks contractions are usually shorter, weaker, farther apart, and less regular than true labor contractions. True labor contractions usually become progressively stronger and regular and they become more frequent.  Manage discomfort from Braxton Hicks contractions by changing position, resting in a warm bath, drinking plenty of water, or practicing deep breathing. This information is not intended to replace advice given to you by your health care provider. Make sure you discuss any questions you have with your health care provider. Document Released: 08/31/2005 Document Revised: 07/20/2016 Document Reviewed: 07/20/2016 Elsevier Interactive Patient Education  2017 Elsevier Inc.  

## 2017-03-31 ENCOUNTER — Other Ambulatory Visit: Payer: Self-pay | Admitting: Advanced Practice Midwife

## 2017-03-31 ENCOUNTER — Ambulatory Visit (INDEPENDENT_AMBULATORY_CARE_PROVIDER_SITE_OTHER): Payer: Medicaid Other | Admitting: Advanced Practice Midwife

## 2017-03-31 ENCOUNTER — Encounter: Payer: Self-pay | Admitting: Advanced Practice Midwife

## 2017-03-31 ENCOUNTER — Telehealth (HOSPITAL_COMMUNITY): Payer: Self-pay | Admitting: *Deleted

## 2017-03-31 VITALS — BP 134/82 | HR 88 | Wt 276.0 lb

## 2017-03-31 DIAGNOSIS — Z1389 Encounter for screening for other disorder: Secondary | ICD-10-CM

## 2017-03-31 DIAGNOSIS — Z331 Pregnant state, incidental: Secondary | ICD-10-CM

## 2017-03-31 DIAGNOSIS — Z3A40 40 weeks gestation of pregnancy: Secondary | ICD-10-CM

## 2017-03-31 DIAGNOSIS — Z3403 Encounter for supervision of normal first pregnancy, third trimester: Secondary | ICD-10-CM

## 2017-03-31 DIAGNOSIS — O480 Post-term pregnancy: Secondary | ICD-10-CM

## 2017-03-31 LAB — POCT URINALYSIS DIPSTICK
GLUCOSE UA: NEGATIVE
Ketones, UA: NEGATIVE
Nitrite, UA: NEGATIVE
Protein, UA: NEGATIVE
RBC UA: NEGATIVE

## 2017-03-31 LAB — OB RESULTS CONSOLE GBS: STREP GROUP B AG: POSITIVE

## 2017-03-31 NOTE — Progress Notes (Signed)
   Induction Assessment Scheduling Form: Fax to [facility]:  [phone]  Mallory RiggsGemini Willis                                                                                   DOB:  03-Aug-1996                                                            MRN:  [id number]                                                                     Phone #: [phone]                           Provider:  [facility]  GP:  G1P0                                                            Estimated Date of Delivery: 03/31/17  Dating Criteria: early US    Medical Indications for induction:  postterm Admission Date/Time:  7/27 midnight Gestational age on admission:  1941.2   Filed Weights   03/31/17 0912  Weight: 276 lb (125.2 kg)   HIV:   neg GBS:  +     Method of induction(proposed):  cytotec   Scheduling Provider Signature:  Greig RightRESENZO-DISHMAN,Allysson Rinehimer, CNM                                            Today's Date:  03/31/2017

## 2017-03-31 NOTE — Addendum Note (Signed)
Addended by: Tish FredericksonLANCASTER, Taichi Repka A on: 03/31/2017 09:55 AM   Modules accepted: Orders

## 2017-03-31 NOTE — Patient Instructions (Signed)

## 2017-03-31 NOTE — Progress Notes (Signed)
G1P0 6676w0d Estimated Date of Delivery: 03/31/17  Blood pressure 134/82, pulse 88, weight 276 lb (125.2 kg), last menstrual period 06/14/2016.   BP weight and urine results all reviewed and noted.  Please refer to the obstetrical flow sheet for the fundal height and fetal heart rate documentation:  Patient reports good fetal movement, denies any bleeding and no rupture of membranes symptoms or regular contractions. Patient is without complaints. All questions were answered.  Orders Placed This Encounter  Procedures  . POCT urinalysis dipstick  . Biophysical profile    Plan:  Continued routine obstetrical care, re peat GBS d/t sensitivity not being done, PCN allergic  Return for Monday for US/BPP.

## 2017-03-31 NOTE — Telephone Encounter (Signed)
Preadmission screen  

## 2017-04-02 ENCOUNTER — Other Ambulatory Visit: Payer: Self-pay | Admitting: Advanced Practice Midwife

## 2017-04-02 DIAGNOSIS — O480 Post-term pregnancy: Secondary | ICD-10-CM

## 2017-04-05 ENCOUNTER — Ambulatory Visit (INDEPENDENT_AMBULATORY_CARE_PROVIDER_SITE_OTHER): Payer: Medicaid Other | Admitting: Obstetrics & Gynecology

## 2017-04-05 ENCOUNTER — Encounter: Payer: Self-pay | Admitting: Obstetrics & Gynecology

## 2017-04-05 ENCOUNTER — Ambulatory Visit (INDEPENDENT_AMBULATORY_CARE_PROVIDER_SITE_OTHER): Payer: Medicaid Other

## 2017-04-05 VITALS — BP 120/70 | HR 74 | Wt 276.0 lb

## 2017-04-05 DIAGNOSIS — Z3A40 40 weeks gestation of pregnancy: Secondary | ICD-10-CM

## 2017-04-05 DIAGNOSIS — O480 Post-term pregnancy: Secondary | ICD-10-CM

## 2017-04-05 DIAGNOSIS — Z3403 Encounter for supervision of normal first pregnancy, third trimester: Secondary | ICD-10-CM

## 2017-04-05 LAB — STREP GP B NAA+RFLX: STREP GP B NAA+RFLX: POSITIVE — AB

## 2017-04-05 LAB — STREP GP B SUSCEPTIBILITY

## 2017-04-05 NOTE — Progress Notes (Signed)
G1P0 5133w5d Estimated Date of Delivery: 03/31/17  Blood pressure 120/70, pulse 74, weight 276 lb (125.2 kg), last menstrual period 06/14/2016.   BP weight and urine results all reviewed and noted.  Please refer to the obstetrical flow sheet for the fundal height and fetal heart rate documentation:  Patient reports good fetal movement, denies any bleeding and no rupture of membranes symptoms or regular contractions. Patient is without complaints. All questions were answered.  No orders of the defined types were placed in this encounter.   Plan:  Continued routine obstetrical care, sonogram normal  Induction scheduled 04/08/2017 2345  Return in about 6 weeks (around 05/17/2017) for post partum visit.

## 2017-04-05 NOTE — Progress Notes (Signed)
US 40+5 wks,cephalic,ant pl gr 3,normal ov's bilat,BPP 8/8,FHR 133 BPM,AFI 13.6 cm,

## 2017-04-07 ENCOUNTER — Other Ambulatory Visit: Payer: Self-pay | Admitting: Advanced Practice Midwife

## 2017-04-08 MED ORDER — VANCOMYCIN HCL IN DEXTROSE 1-5 GM/200ML-% IV SOLN
1000.0000 mg | Freq: Two times a day (BID) | INTRAVENOUS | Status: DC
  Administered 2017-04-09: 1000 mg via INTRAVENOUS
  Filled 2017-04-08 (×2): qty 200

## 2017-04-08 NOTE — H&P (Signed)
Mallory Willis is a 21 y.o. female G1P0 with IUP at 3568w1d presenting for IOL for postdates. [facility]  Prenatal History/Complications:  + GC treated 7/3 Will need POC   Past Medical History: Past Medical History:  Diagnosis Date  . Asthma   . Chronic back pain   . Gonorrhea   . Nausea and vomiting during pregnancy 08/14/2016  . Supervision of normal pregnancy 08/14/2016    Clinic [facility] Initiated Care at   7+2 weeks FOB  Mallory Willis 21 yo bm 2nd Dating By  LMP and US  Pap   NA GC/CT Initial:                36+wks: Genetic Screen NT/IT:  CF screen   Declined  Anatomic US  Flu vaccine  Tdap Recommended ~ 28wks Glucose Screen  2 hr GBS  Feed Preference  Contraception  Circumcision  Childbirth Classes  Pediatrician      Past Surgical History: History reviewed. No pertinent surgical history.  Obstetrical History: OB History    Gravida Para Term Preterm AB Living   1             SAB TAB Ectopic Multiple Live Births                  Social History: Social History   Social History  . Marital status: Single    Spouse name: N/A  . Number of children: N/A  . Years of education: N/A   Social History Main Topics  . Smoking status: Never Smoker  . Smokeless tobacco: Never Used  . Alcohol use No  . Drug use: Yes    Types: Marijuana     Comment: last used 03/28/17  . Sexual activity: Not Currently    Birth control/ protection: None   Other Topics Concern  . None   Social History Narrative  . None    Family History: Family History  Problem Relation Age of Onset  . Other Mother        degenerative disc  . Other Father        heart surgery    Allergies: Allergies  Allergen Reactions  . Amoxicillin Rash  . Ceclor [Cefaclor] Rash  . Red Dye Rash  . Ultram [Tramadol] Itching    Prescriptions Prior to Admission  Medication Sig Dispense Refill Last Dose  . cetirizine (ZYRTEC) 10 MG tablet Take 10 mg by mouth daily as needed for  allergies.   Not Taking  . ondansetron (ZOFRAN-ODT) 4 MG disintegrating tablet Take 1 tablet (4 mg total) by mouth every 6 (six) hours as needed for nausea or vomiting. (Patient not taking: Reported on 04/05/2017) 30 tablet 1 Not Taking  . Prenat w/o A-FeCbGl-DSS-FA-DHA (CITRANATAL ASSURE) 35-1 & 300 MG tablet One tablet and one capsule daily 60 tablet 11 Taking       [facility]inic [facility]  Initiated Care at   7+2 weeks  FOB  Mallory Willis 21 yo bm 2nd  Dating By  LMP and US   Pap   NA  GC/CT Initial:      -/-          36+wks:  +/-  POC:  Genetic Screen NT/IT: neg  CF screen   Declined   Anatomic US Normal girl  Flu vaccine Recommended flu shot w/ pcp/hd (<21yo)   Tdap Recommended ~ 28wks  Glucose Screen  2 hr normal: 73/79/85  GBS Pos (clinda resistant)  Feed  Preference breast  Contraception POPs vs condoms  Circumcision n/a  Childbirth Classes Interested, info given  Pediatrician [facility]    Review of Systems   Constitutional: Negative for fever and chills Eyes: Negative for visual disturbances Respiratory: Negative for shortness of breath, dyspnea Cardiovascular: Negative for chest pain or palpitations  Gastrointestinal: Negative for abdominal pain, vomiting, diarrhea and constipation.   Genitourinary: Negative for dysuria and urgency Musculoskeletal: Negative for back pain, joint pain, myalgias  Neurological: Negative for dizziness and headaches   Blood pressure (!) 155/71, pulse 98, temperature 99.1 F (37.3 C), temperature source Axillary, resp. rate 20, height 5\' 9"  (1.753 m), weight 276 lb (125.2 kg), last menstrual period 06/14/2016, SpO2 99 %. General appearance: alert, cooperative and no distress Lungs: clear to auscultation bilaterally Heart: regular rate and rhythm Abdomen: soft, non-tender; bowel sounds normal Extremities: Homans sign is negative, no sign of DVT DTR's 2+ Presentation: cephalic Fetal monitoring  Baseline: 140 bpm, Variability: Good  {> 6 bpm), Accelerations: Reactive and Decelerations: Absent Uterine activity  None  Dilation: 1.5 Effacement (%): 50 Station: -2 Exam by:: Mallory Willis,Mallory Willis  Foley inserted, will give oral cytotec   Prenatal labs: ABO, Rh: O/Positive/-- (12/01 1446) Antibody: Negative (04/30 0904) Rubella: immune RPR: Non Reactive (04/30 0904)  HBsAg: Negative (12/01 1446)  HIV:   neg   Prenatal Transfer Tool  Maternal Diabetes: No Genetic Screening: Normal Maternal Ultrasounds/Referrals: Normal Fetal Ultrasounds or other Referrals:  None Maternal Substance Abuse:  No Significant Maternal Medications:  None Significant Maternal Lab Results: Lab values include: Group B Strep positive    Results for orders placed or performed during the hospital encounter of 04/09/17 (from the past 24 hour(s))  CBC   Collection Time: 04/09/17  1:04 AM  Result Value Ref Range   WBC 10.0 4.0 - 10.5 K/uL   RBC 4.04 3.87 - 5.11 MIL/uL   Hemoglobin 11.7 (L) 12.0 - 15.0 g/dL   HCT 16.134.4 (L) 09.636.0 - 04.546.0 %   MCV 85.1 78.0 - 100.0 fL   MCH 29.0 26.0 - 34.0 pg   MCHC 34.0 30.0 - 36.0 g/dL   RDW 40.915.1 81.111.5 - 91.415.5 %   Platelets 294 150 - 400 K/uL    Assessment: Mallory Willis is a 21 y.o. G1P0 with an IUP at 6231w1d presenting for IOL for postdates.  Plan: #Labor: PO Cytotec->Foley->pitocin #Pain:  Per request #FWB Cat 1 #ID: GBS: vancomycin (urticaria w/PCN)  #MOF:  breast #MOC: POPs vs condom #Circ: n/a   CRESENZO-DISHMAN,Sue Fernicola 04/09/2017, 1:28 AM

## 2017-04-09 ENCOUNTER — Inpatient Hospital Stay (HOSPITAL_COMMUNITY): Payer: Medicaid Other | Admitting: Anesthesiology

## 2017-04-09 ENCOUNTER — Inpatient Hospital Stay (HOSPITAL_COMMUNITY)
Admission: RE | Admit: 2017-04-09 | Discharge: 2017-04-12 | DRG: 765 | Disposition: A | Discharge status: Home / Self Care | Payer: Medicaid Other | Source: Ambulatory Visit | Attending: Obstetrics and Gynecology | Admitting: Obstetrics and Gynecology

## 2017-04-09 ENCOUNTER — Encounter (HOSPITAL_COMMUNITY): Payer: Self-pay

## 2017-04-09 VITALS — BP 140/68 | HR 72 | Temp 98.0°F | Resp 18 | Ht 69.0 in | Wt 276.0 lb

## 2017-04-09 DIAGNOSIS — O480 Post-term pregnancy: Secondary | ICD-10-CM | POA: Diagnosis present

## 2017-04-09 DIAGNOSIS — Z68.41 Body mass index (BMI) pediatric, 5th percentile to less than 85th percentile for age: Secondary | ICD-10-CM

## 2017-04-09 DIAGNOSIS — O99324 Drug use complicating childbirth: Secondary | ICD-10-CM | POA: Diagnosis present

## 2017-04-09 DIAGNOSIS — O99214 Obesity complicating childbirth: Secondary | ICD-10-CM | POA: Diagnosis present

## 2017-04-09 DIAGNOSIS — O99824 Streptococcus B carrier state complicating childbirth: Secondary | ICD-10-CM | POA: Diagnosis present

## 2017-04-09 DIAGNOSIS — Z880 Allergy status to penicillin: Secondary | ICD-10-CM | POA: Diagnosis not present

## 2017-04-09 DIAGNOSIS — O339 Maternal care for disproportion, unspecified: Secondary | ICD-10-CM | POA: Diagnosis present

## 2017-04-09 DIAGNOSIS — Z3A41 41 weeks gestation of pregnancy: Secondary | ICD-10-CM

## 2017-04-09 DIAGNOSIS — F1290 Cannabis use, unspecified, uncomplicated: Secondary | ICD-10-CM | POA: Diagnosis present

## 2017-04-09 DIAGNOSIS — O134 Gestational [pregnancy-induced] hypertension without significant proteinuria, complicating childbirth: Secondary | ICD-10-CM | POA: Diagnosis present

## 2017-04-09 DIAGNOSIS — O219 Vomiting of pregnancy, unspecified: Secondary | ICD-10-CM

## 2017-04-09 DIAGNOSIS — O139 Gestational [pregnancy-induced] hypertension without significant proteinuria, unspecified trimester: Secondary | ICD-10-CM | POA: Diagnosis present

## 2017-04-09 DIAGNOSIS — Z3403 Encounter for supervision of normal first pregnancy, third trimester: Secondary | ICD-10-CM

## 2017-04-09 DIAGNOSIS — O98213 Gonorrhea complicating pregnancy, third trimester: Secondary | ICD-10-CM

## 2017-04-09 DIAGNOSIS — O133 Gestational [pregnancy-induced] hypertension without significant proteinuria, third trimester: Secondary | ICD-10-CM

## 2017-04-09 LAB — COMPREHENSIVE METABOLIC PANEL
ALBUMIN: 3.1 g/dL — AB (ref 3.5–5.0)
ALT: 8 U/L — AB (ref 14–54)
AST: 17 U/L (ref 15–41)
Alkaline Phosphatase: 246 U/L — ABNORMAL HIGH (ref 38–126)
Anion gap: 7 (ref 5–15)
BUN: 11 mg/dL (ref 6–20)
CHLORIDE: 105 mmol/L (ref 101–111)
CO2: 23 mmol/L (ref 22–32)
CREATININE: 0.58 mg/dL (ref 0.44–1.00)
Calcium: 9 mg/dL (ref 8.9–10.3)
GFR calc Af Amer: >60 mL/min (ref >60)
GFR calc non Af Amer: >60 mL/min (ref >60)
GLUCOSE: 93 mg/dL (ref 65–99)
POTASSIUM: 4 mmol/L (ref 3.5–5.1)
Sodium: 135 mmol/L (ref 135–145)
Total Bilirubin: 0.1 mg/dL — ABNORMAL LOW (ref 0.3–1.2)
Total Protein: 6.5 g/dL (ref 6.5–8.1)

## 2017-04-09 LAB — CBC
HCT: 34.4 % — ABNORMAL LOW (ref 36.0–46.0)
HEMOGLOBIN: 11.7 g/dL — AB (ref 12.0–15.0)
MCH: 29 pg (ref 26.0–34.0)
MCHC: 34 g/dL (ref 30.0–36.0)
MCV: 85.1 fL (ref 78.0–100.0)
Platelets: 294 10*3/uL (ref 150–400)
RBC: 4.04 MIL/uL (ref 3.87–5.11)
RDW: 15.1 % (ref 11.5–15.5)
WBC: 10 10*3/uL (ref 4.0–10.5)

## 2017-04-09 LAB — PROTEIN / CREATININE RATIO, URINE
Creatinine, Urine: 84 mg/dL
Protein Creatinine Ratio: 0.29 mg/mg{Cre} — ABNORMAL HIGH (ref 0.00–0.15)
Total Protein, Urine: 24 mg/dL

## 2017-04-09 LAB — TYPE AND SCREEN
ABO/RH(D): O POS
Antibody Screen: NEGATIVE

## 2017-04-09 LAB — ABO/RH: ABO/RH(D): O POS

## 2017-04-09 LAB — URINE CYTOLOGY ANCILLARY ONLY
CHLAMYDIA, DNA PROBE: NEGATIVE
NEISSERIA GONORRHEA: NEGATIVE

## 2017-04-09 LAB — RPR: RPR: NONREACTIVE

## 2017-04-09 MED ORDER — PHENYLEPHRINE 40 MCG/ML (10ML) SYRINGE FOR IV PUSH (FOR BLOOD PRESSURE SUPPORT)
PREFILLED_SYRINGE | INTRAVENOUS | Status: AC
  Filled 2017-04-09: qty 20

## 2017-04-09 MED ORDER — PHENYLEPHRINE 40 MCG/ML (10ML) SYRINGE FOR IV PUSH (FOR BLOOD PRESSURE SUPPORT): 80.0000 ug | PREFILLED_SYRINGE | INTRAVENOUS | Status: DC | PRN

## 2017-04-09 MED ORDER — EPHEDRINE 5 MG/ML INJ: 10.0000 mg | INTRAVENOUS | Status: DC | PRN

## 2017-04-09 MED ORDER — OXYCODONE-ACETAMINOPHEN 5-325 MG PO TABS: 1.0000 | ORAL_TABLET | ORAL | Status: DC | PRN

## 2017-04-09 MED ORDER — ONDANSETRON HCL 4 MG/2ML IJ SOLN
4.0000 mg | Freq: Four times a day (QID) | INTRAMUSCULAR | Status: DC | PRN
  Administered 2017-04-09: 4 mg via INTRAVENOUS
  Filled 2017-04-09: qty 2

## 2017-04-09 MED ORDER — CEFAZOLIN SODIUM-DEXTROSE 1-4 GM/50ML-% IV SOLN: 1.0000 g | Freq: Three times a day (TID) | INTRAVENOUS | Status: DC

## 2017-04-09 MED ORDER — VANCOMYCIN HCL IN DEXTROSE 1-5 GM/200ML-% IV SOLN
1000.0000 mg | Freq: Two times a day (BID) | INTRAVENOUS | Status: DC
  Administered 2017-04-09: 1000 mg via INTRAVENOUS
  Filled 2017-04-09 (×3): qty 200

## 2017-04-09 MED ORDER — ACETAMINOPHEN 325 MG PO TABS: 650.0000 mg | ORAL_TABLET | ORAL | Status: DC | PRN

## 2017-04-09 MED ORDER — FENTANYL CITRATE (PF) 100 MCG/2ML IJ SOLN
100.0000 ug | INTRAMUSCULAR | Status: DC | PRN
  Administered 2017-04-09 (×6): 100 ug via INTRAVENOUS
  Filled 2017-04-09 (×6): qty 2

## 2017-04-09 MED ORDER — CEFAZOLIN SODIUM-DEXTROSE 2-4 GM/100ML-% IV SOLN: 2.0000 g | Freq: Once | INTRAVENOUS | Status: DC

## 2017-04-09 MED ORDER — LIDOCAINE HCL (PF) 1 % IJ SOLN
30.0000 mL | INTRAMUSCULAR | Status: DC | PRN
  Filled 2017-04-09: qty 30

## 2017-04-09 MED ORDER — LACTATED RINGERS IV SOLN
500.0000 mL | Freq: Once | INTRAVENOUS | Status: AC
  Administered 2017-04-09: 500 mL via INTRAVENOUS

## 2017-04-09 MED ORDER — TERBUTALINE SULFATE 1 MG/ML IJ SOLN
0.2500 mg | Freq: Once | INTRAMUSCULAR | Status: DC | PRN
Start: 2017-04-09 — End: 2017-04-10

## 2017-04-09 MED ORDER — LACTATED RINGERS IV SOLN: 500.0000 mL | INTRAVENOUS | Status: DC | PRN

## 2017-04-09 MED ORDER — MISOPROSTOL 25 MCG QUARTER TABLET
25.0000 ug | ORAL_TABLET | ORAL | Status: DC | PRN
  Filled 2017-04-09: qty 1

## 2017-04-09 MED ORDER — DIPHENHYDRAMINE HCL 50 MG/ML IJ SOLN: 12.5000 mg | INTRAMUSCULAR | Status: DC | PRN

## 2017-04-09 MED ORDER — OXYCODONE-ACETAMINOPHEN 5-325 MG PO TABS: 2.0000 | ORAL_TABLET | ORAL | Status: DC | PRN

## 2017-04-09 MED ORDER — SOD CITRATE-CITRIC ACID 500-334 MG/5ML PO SOLN
30.0000 mL | ORAL | Status: DC | PRN
  Administered 2017-04-10: 30 mL via ORAL
  Filled 2017-04-09: qty 15

## 2017-04-09 MED ORDER — FENTANYL 2.5 MCG/ML BUPIVACAINE 1/10 % EPIDURAL INFUSION (WH - ANES)
14.0000 mL/h | INTRAMUSCULAR | Status: DC | PRN
  Administered 2017-04-09: 14 mL/h via EPIDURAL

## 2017-04-09 MED ORDER — LACTATED RINGERS IV SOLN
INTRAVENOUS | Status: DC
  Administered 2017-04-09 – 2017-04-10 (×4): via INTRAVENOUS

## 2017-04-09 MED ORDER — OXYTOCIN 40 UNITS IN LACTATED RINGERS INFUSION - SIMPLE MED: 2.5000 [IU]/h | INTRAVENOUS | Status: DC

## 2017-04-09 MED ORDER — LACTATED RINGERS IV SOLN: 500.0000 mL | Freq: Once | INTRAVENOUS | Status: AC

## 2017-04-09 MED ORDER — MISOPROSTOL 50MCG HALF TABLET
50.0000 ug | ORAL_TABLET | ORAL | Status: DC | PRN
  Administered 2017-04-09 (×2): 50 ug via ORAL
  Filled 2017-04-09 (×2): qty 1

## 2017-04-09 MED ORDER — FENTANYL 2.5 MCG/ML BUPIVACAINE 1/10 % EPIDURAL INFUSION (WH - ANES)
INTRAMUSCULAR | Status: AC
  Filled 2017-04-09: qty 100

## 2017-04-09 MED ORDER — OXYTOCIN BOLUS FROM INFUSION: 500.0000 mL | Freq: Once | INTRAVENOUS | Status: DC

## 2017-04-09 MED ORDER — OXYTOCIN 40 UNITS IN LACTATED RINGERS INFUSION - SIMPLE MED
1.0000 m[IU]/min | INTRAVENOUS | Status: DC
  Administered 2017-04-09: 2 m[IU]/min via INTRAVENOUS
  Filled 2017-04-09: qty 1000

## 2017-04-09 MED ORDER — LIDOCAINE HCL (PF) 1 % IJ SOLN
INTRAMUSCULAR | Status: DC | PRN
  Administered 2017-04-09: 2 mL via EPIDURAL
  Administered 2017-04-09: 5 mL via EPIDURAL
  Administered 2017-04-09: 3 mL via EPIDURAL

## 2017-04-09 NOTE — H&P (Signed)
Obstetric History and Physical  Mallory Willis is a 21 y.o. G1P0 with IUP at 4869w2d presenting for IOL 2/2 postdates. Patient states she has been having  irregular,contractions, none vaginal bleeding, intact membranes, with active fetal movement.   She has a PMH notable for  Past Medical History:  Diagnosis Date  . Asthma   . Chronic back pain   . Gonorrhea   . Nausea and vomiting during pregnancy 08/14/2016  . Supervision of normal pregnancy 08/14/2016    [facility] Initiated Care at   7+2 weeks FOB  malick mcfarland 21 yo bm 2nd Dating By  LMP and US  Pap   NA GC/CT Initial:                36+wks: Genetic Screen NT/IT:  CF screen   Declined  Anatomic US  Flu vaccine  Tdap Recommended ~ 28wks Glucose Screen  2 hr GBS  Feed Preference  Contraception  Circumcision  Childbirth Classes  Pediatrician      Prenatal Course [facility]  with onset of care at 6 weeks Pregnancy complications or risks: Patient Active Problem List   Diagnosis Date Noted  . Post term pregnancy at [redacted] weeks gestation 04/09/2017  . Gonorrhea affecting pregnancy in third trimester 03/11/2017  . Marijuana use 09/21/2016  . Supervision of normal pregnancy 08/14/2016  . Nausea and vomiting during pregnancy 08/14/2016   She plans to breastfeed, or at least thinking about it  Contraception-unsure Having a girl   Prenatal labs and studies: ABO, Rh: O/Positive/-- (12/01 1446) Antibody: Negative (04/30 0904) Rubella: 2.32 (12/01 1446) RPR: Non Reactive (04/30 0904)  HBsAg: Negative (12/01 1446)  HIV:   neg  GBS: positive 2hrgtt wnl Genetic screening normal Anatomy US normal  Prenatal Transfer Tool  Maternal Diabetes: No Genetic Screening: Normal Maternal Ultrasounds/Referrals: Normal Fetal Ultrasounds or other Referrals:  None Maternal Substance Abuse:  No Significant Maternal Medications:  None Significant Maternal Lab Results: None  Past Medical History:  Diagnosis Date  . Asthma   .  Chronic back pain   . Gonorrhea   . Nausea and vomiting during pregnancy 08/14/2016  . Supervision of normal pregnancy 08/14/2016    [facility] Initiated Care at   7+2 weeks FOB  malick mcfarland 21 yo bm 2nd Dating By  LMP and US  Pap   NA GC/CT Initial:                36+wks: Genetic Screen NT/IT:  CF screen   Declined  Anatomic US  Flu vaccine  Tdap Recommended ~ 28wks Glucose Screen  2 hr GBS  Feed Preference  Contraception  Circumcision  Childbirth Classes  Pediatrician      No past surgical history on file.  OB History  Gravida Para Term Preterm AB Living  1            SAB TAB Ectopic Multiple Live Births               # Outcome Date GA Lbr Len/2nd Weight Sex Delivery Anes PTL Lv  1 Current               Social History   Social History  . Marital status: Single    Spouse name: N/A  . Number of children: N/A  . Years of education: N/A   Social History Main Topics  . Smoking status: Never Smoker  . Smokeless tobacco: Never Used  . Alcohol use No  .  Drug use: Yes    Types: Marijuana     Comment: last used 03/28/17  . Sexual activity: Not Currently    Birth control/ protection: None   Other Topics Concern  . Not on file   Social History Narrative  . No narrative on file    Family History  Problem Relation Age of Onset  . Other Mother        degenerative disc  . Other Father        heart surgery    Prescriptions Prior to Admission  Medication Sig Dispense Refill Last Dose  . cetirizine (ZYRTEC) 10 MG tablet Take 10 mg by mouth daily as needed for allergies.   Not Taking  . ondansetron (ZOFRAN-ODT) 4 MG disintegrating tablet Take 1 tablet (4 mg total) by mouth every 6 (six) hours as needed for nausea or vomiting. (Patient not taking: Reported on 04/05/2017) 30 tablet 1 Not Taking  . Prenat w/o A-FeCbGl-DSS-FA-DHA (CITRANATAL ASSURE) 35-1 & 300 MG tablet One tablet and one capsule daily 60 tablet 11 Taking    Allergies  Allergen Reactions  .  Amoxicillin Rash  . Ceclor [Cefaclor] Rash  . Red Dye Rash  . Ultram [Tramadol] Itching    Review of Systems: Negative except for what is mentioned in HPI.  Physical Exam: BP (!) 155/71 (BP Location: Right Arm)   Pulse 98   Temp 99.1 F (37.3 C) (Axillary)   Resp 20   Ht 5\' 9"  (1.753 m)   Wt 125.2 kg (276 lb)   LMP 06/14/2016 (Approximate)   SpO2 99%   BMI 40.76 kg/m  CONSTITUTIONAL: Well-developed, well-nourished female in no acute distress.  SKIN: Skin is warm and dry. No rash noted. Not diaphoretic. No erythema. No pallor. PSYCHIATRIC: Normal mood and affect. Normal behavior. Normal judgment and thought content. CARDIOVASCULAR: Normal heart rate noted, regular rhythm RESPIRATORY: Effort and breath sounds normal, no problems with respiration noted  FHR: baseline 135-140 moderate variability with accelerations w/o decelerations   Dilation: 1.5 Effacement (%): 50 Station: -2 Presentation: Vertex Exam by:: fran cresendo,cnm  Pertinent Labs/Studies:   No results found for this or any previous visit (from the past 24 hour(s)).  Assessment : Mallory RiggsGemini Charter is a 21 y.o. G1P0 at 2535w2d being admitted for induction of labor due to post dates.  Plan: Labor: Induction/Augmentation as ordered as per protocol.  Analgesia as needed. FWB: Reassuring fetal heart tracing.  Cat 1 GBS positive-vancomycin BP: elevated reading once tonight, monitor closely. Delivery plan: Hopeful for vaginal delivery   Sullivan LoneBrannon L Inman, Medical Student   I agaree with this plan. Please see separate H&P

## 2017-04-09 NOTE — Anesthesia Procedure Notes (Signed)
Epidural Patient location during procedure: OB Start time: 04/09/2017 11:20 PM End time: 04/09/2017 11:25 PM  Staffing Anesthesiologist: Cecile HearingURK, Wirt Hemmerich EDWARD Performed: anesthesiologist   Preanesthetic Checklist Completed: patient identified, pre-op evaluation, timeout performed, IV checked, risks and benefits discussed and monitors and equipment checked  Epidural Patient position: sitting Prep: DuraPrep Patient monitoring: blood pressure and continuous pulse ox Approach: midline Location: L3-L4 Injection technique: LOR air  Needle:  Needle type: Tuohy  Needle gauge: 17 G Needle length: 9 cm Needle insertion depth: 7 cm Catheter size: 19 Gauge Catheter at skin depth: 13 cm Test dose: negative and Other (1% Lidocaine)  Additional Notes Patient identified.  Risk benefits discussed including failed block, incomplete pain control, headache, nerve damage, paralysis, blood pressure changes, nausea, vomiting, reactions to medication both toxic or allergic, and postpartum back pain.  Patient expressed understanding and wished to proceed.  All questions were answered.  Sterile technique used throughout procedure and epidural site dressed with sterile barrier dressing. No paresthesia or other complications noted. The patient did not experience any signs of intravascular injection such as tinnitus or metallic taste in mouth nor signs of intrathecal spread such as rapid motor block. Please see nursing notes for vital signs. Reason for block:procedure for pain

## 2017-04-09 NOTE — Progress Notes (Signed)
Patient ID: Mallory RiggsGemini Langhorst, female   DOB: December 31, 1995, 21 y.o.   MRN: [id number] Labor Progress Note  S: Patient seen & examined for progress of labor. Patient uncomfortable. Does not wish to have an epidural. Breathing heavily through contractions, but we are not able to trace them well due to her laying on her side and moving so much during contractions- per nurse.  Pitocin increasing slowly because of the patient's discomfort during contractions.  O: BP 127/66   Pulse 88   Temp 98.6 F (37 C) (Oral)   Resp 17   Ht 5\' 9"  (1.753 m)   Wt 125.2 kg (276 lb)   LMP 06/14/2016 (Approximate)   SpO2 99%   BMI 40.76 kg/m   FHT: 120bpm, mod var, +accels, no decels TOCO: q2-234min, patient looks comfortable during contractions  CVE: Dilation:  (deferred, pt does not wish to be checked) Effacement (%): 70, 80 Station: -2 Presentation: Vertex Exam by:: stone    Last Exam : 5.5/70-80/-2  A&P: 21 y.o. G1P0 2651w2d PDIOL  Currently on 8 milli-units of pitocin, s/p foley bulb and cytotec; AROM @1630  Continue to increase pit Continue current management Anticipate SVD  [place] Amarrion Pastorino, DO FM Resident PGY-1 04/09/2017 7:02PM

## 2017-04-09 NOTE — Progress Notes (Signed)
Patient ID: Nilda RiggsGemini Palladino, female   DOB: [birth year], 21 y.o.   MRN: [id number] Labor Progress Note  S: Patient seen & examined for progress of labor. Patient comfortable, does not want an epidural.   O: BP 129/75   Pulse 76   Temp 97.9 F (36.6 C) (Oral)   Resp 18   Ht 5\' 9"  (1.753 m)   Wt 125.2 kg (276 lb)   LMP 06/14/2016 (Approximate)   SpO2 99%   BMI 40.76 kg/m   FHT: 120 bpm, mod var, +accels, no decels TOCO: q2-125min, patient looks comfortable during contractions  CVE: Dilation: 5 Effacement (%): 60 Station: -3 Presentation: Vertex Exam by:: stone rnc  A&P: 21 y.o. G1P0 8240w2d for PDIOL  Currently on 8 milli-units of pitocin, s/p cytotec x2 and foley bulb Continue current management Anticipate SVD  [place] Johnta Couts, DO FM Resident PGY-1 04/09/2017 1:42 PM

## 2017-04-09 NOTE — Anesthesia Preprocedure Evaluation (Addendum)
Anesthesia Evaluation  Patient identified by MRN, date of birth, ID band Patient awake    Reviewed: Allergy & Precautions, NPO status , Patient's Chart, lab work & pertinent test results  Airway Mallampati: II  TM Distance: >3 FB Neck ROM: Full    Dental  (+) Teeth Intact, Dental Advisory Given   Pulmonary asthma ,    Pulmonary exam normal breath sounds clear to auscultation       Cardiovascular negative cardio ROS Normal cardiovascular exam Rhythm:Regular Rate:Normal     Neuro/Psych negative neurological ROS  negative psych ROS   GI/Hepatic negative GI ROS, Neg liver ROS,   Endo/Other  Morbid obesity  Renal/GU negative Renal ROS     Musculoskeletal negative musculoskeletal ROS (+)   Abdominal   Peds  Hematology  (+) Blood dyscrasia, anemia , Plt 294k   Anesthesia Other Findings Day of surgery medications reviewed with the patient.  Reproductive/Obstetrics (+) Pregnancy                             Anesthesia Physical Anesthesia Plan  ASA: III and emergent  Anesthesia Plan: Epidural   Post-op Pain Management:    Induction:   PONV Risk Score and Plan: Treatment may vary due to age or medical condition  Airway Management Planned:   Additional Equipment:   Intra-op Plan:   Post-operative Plan:   Informed Consent: I have reviewed the patients History and Physical, chart, labs and discussed the procedure including the risks, benefits and alternatives for the proposed anesthesia with the patient or authorized representative who has indicated his/her understanding and acceptance.   Dental advisory given  Plan Discussed with:   Anesthesia Plan Comments: (Patient identified. Risks/Benefits/Options discussed with patient including but not limited to bleeding, infection, nerve damage, paralysis, failed block, incomplete pain control, headache, blood pressure changes, nausea,  vomiting, reactions to medication both or allergic, itching and postpartum back pain. Confirmed with bedside nurse the patient's most recent platelet count. Confirmed with patient that they are not currently taking any anticoagulation, have any bleeding history or any family history of bleeding disorders. Patient expressed understanding and wished to proceed. All questions were answered.   Conversion to C-section for failure to progress.  Will proceed to OR with epidural dosed for surgical anesthesia.)       Anesthesia Quick Evaluation

## 2017-04-09 NOTE — Anesthesia Pain Management Evaluation Note (Signed)
  CRNA Pain Management Visit Note  Patient: Mallory Willis, 21 y.o., female  "Hello I am a [facility] of the [facility]. We have an anesthesia team available at all times to provide care throughout the hospital, including epidural management and anesthesia for C-section. I don't know your plan for the delivery whether it a natural birth, water birth, IV sedation, nitrous supplementation, doula or epidural, but we want to meet your pain goals."   1.Was your pain managed to your expectations on prior hospitalizations?   No prior hospitalizations  2.What is your expectation for pain management during this hospitalization?     Labor support without medications  3.How can we help you reach that goal?   Record the patient's initial score and the patient's pain goal.   Pain: 7  Pain Goal: 5 [facility] wants you to be able to say your pain was always managed very well.  Mallory Willis,Mallory Willis 04/09/2017

## 2017-04-09 NOTE — Progress Notes (Signed)
Foley bulb out 

## 2017-04-09 NOTE — Progress Notes (Signed)
LABOR PROGRESS NOTE  Mallory Willis is a 21 y.o. G1P0 at 1343w2d  admitted for IOL 2/2 post dates   Subjective: Pt is awake, no complaints. Family in room.   Objective: BP (!) 147/91   Pulse 80   Temp 98.5 F (36.9 C) (Oral)   Resp 18   Ht 5\' 9"  (1.753 m)   Wt 276 lb (125.2 kg)   LMP 06/14/2016 (Approximate)   SpO2 99%   BMI 40.76 kg/m  or  Vitals:   04/09/17 0052 04/09/17 0533 04/09/17 0837  BP: (!) 155/71 137/87 (!) 147/91  Pulse: 98 94 80  Resp: 20  18  Temp: 99.1 F (37.3 C) 98.1 F (36.7 C) 98.5 F (36.9 C)  TempSrc: Axillary Oral Oral  SpO2: 99%    Weight: 276 lb (125.2 kg)    Height: 5\' 9"  (1.753 m)      FHR: 135 mod variability with accelerations w/o decelerations  Dilation: 1.5 Effacement (%): 50 Station: -2 Presentation: Vertex Exam by:: fran cresendo,cnm  Labs: Lab Results  Component Value Date   WBC 10.0 04/09/2017   HGB 11.7 (L) 04/09/2017   HCT 34.4 (L) 04/09/2017   MCV 85.1 04/09/2017   PLT 294 04/09/2017    Patient Active Problem List   Diagnosis Date Noted  . Post term pregnancy at [redacted] weeks gestation 04/09/2017  . Gestational hypertension 04/09/2017  . Gonorrhea affecting pregnancy in third trimester 03/11/2017  . Marijuana use 09/21/2016  . Supervision of normal pregnancy 08/14/2016  . Nausea and vomiting during pregnancy 08/14/2016    Assessment / Plan: 21 y.o. G1P0 at 6243w2d here for IOL 2/2 postdate  Labor: continue augmentation; FB out; Start pitocin  Fetal Wellbeing:  Cat 1 Pain Control:  IV medications as needed Anticipated MOD:  SVD  Caryl AdaJazma Phelps, DO OB Fellow Faculty Practice, [facility] 04/09/2017, 9:56 AM

## 2017-04-09 NOTE — Progress Notes (Signed)
Patient ID: Mallory Willis, female   DOB: [birth year], 21 y.o.   MRN: [id number]  Feels ctx getting stronger; Fentanyl x 2  VSS, afeb FHR 130s, +accels, no decels Ctx q 3 mins w/ Pit @ 598mu/min Cx 5+/70/-2, post; AROM for clear fluid  IUP@term  Early active labor GBS pos  Anticipate labor increasing Has rec'd Jake MichaelisVanc  SHAW, KIMBERLY CNM 04/09/2017 4:43 PM

## 2017-04-09 NOTE — Progress Notes (Signed)
LABOR PROGRESS NOTE  Nilda RiggsGemini Achey is a 21 y.o. G1P0 at 4356w2d  admitted for IOL 2/2 post dates   Subjective: Pt is awake, complains that fetal heart monitor is uncomfortable   Objective: BP 137/87   Pulse 94   Temp 98.1 F (36.7 C) (Oral)   Resp 20   Ht 5\' 9"  (1.753 m)   Wt 125.2 kg (276 lb)   LMP 06/14/2016 (Approximate)   SpO2 99%   BMI 40.76 kg/m  or  Vitals:   04/09/17 0052 04/09/17 0533  BP: (!) 155/71 137/87  Pulse: 98 94  Resp: 20   Temp: 99.1 F (37.3 C) 98.1 F (36.7 C)  TempSrc: Axillary Oral  SpO2: 99%   Weight: 125.2 kg (276 lb)   Height: 5\' 9"  (1.753 m)     FHR: 135 mod variability with accelerations w/o decelerations  Dilation: 1.5 Effacement (%): 50 Station: -2 Presentation: Vertex Exam by:: fran cresendo,cnm  Labs: Lab Results  Component Value Date   WBC 10.0 04/09/2017   HGB 11.7 (L) 04/09/2017   HCT 34.4 (L) 04/09/2017   MCV 85.1 04/09/2017   PLT 294 04/09/2017    Patient Active Problem List   Diagnosis Date Noted  . Post term pregnancy at [redacted] weeks gestation 04/09/2017  . Gonorrhea affecting pregnancy in third trimester 03/11/2017  . Marijuana use 09/21/2016  . Supervision of normal pregnancy 08/14/2016  . Nausea and vomiting during pregnancy 08/14/2016    Assessment / Plan: 21 y.o. G1P0 at 4256w2d here for IOL 2/2 postdate  Labor: continue current augmentation. Misoprostol q 4 hr  Fetal Wellbeing:  Cat 1 Pain Control:  Iv medications as needed Anticipated MOD:  SVD  Sullivan LoneBrannon L Inman,  04/09/2017, 6:16 AM   ,I have seen and examined this patient and agree with the management plan.

## 2017-04-09 NOTE — Progress Notes (Signed)
LABOR PROGRESS NOTE  Mallory RiggsGemini Willis is a 21 y.o. G1P0 at 10522w2d  admitted for IOL postdates   Subjective  Pt is clearly uncomfortable. She is breathing hard through contractions. She is still resistant to have exams or interventions performed. She is still wanting to continue without an epidural   Objective: BP 116/72   Pulse 90   Temp 97.6 F (36.4 C) (Oral)   Resp 17   Ht 5\' 9"  (1.753 m)   Wt 125.2 kg (276 lb)   LMP 06/14/2016 (Approximate)   SpO2 99%   BMI 40.76 kg/m  or  Vitals:   04/09/17 1932 04/09/17 2001 04/09/17 2031 04/09/17 2101  BP: 131/87 (!) 145/81 101/80 116/72  Pulse: 91 76 76 90  Resp:      Temp:   97.6 F (36.4 C)   TempSrc:   Oral   SpO2:      Weight:      Height:       FHR: 125 with moderate variability with accelerations w/o decelerations  Toco: regular uterine contractions  Dilation: 5.5 Effacement (%): 70, 80 Station: -2 Presentation: Vertex Exam by:: dr Emelda Fearferguson  Pt is noted to have a narrow muscular pelvis on exam  Labs: Lab Results  Component Value Date   WBC 10.0 04/09/2017   HGB 11.7 (L) 04/09/2017   HCT 34.4 (L) 04/09/2017   MCV 85.1 04/09/2017   PLT 294 04/09/2017    Patient Active Problem List   Diagnosis Date Noted  . Post term pregnancy at [redacted] weeks gestation 04/09/2017  . Gestational hypertension 04/09/2017  . Gonorrhea affecting pregnancy in third trimester 03/11/2017  . Marijuana use 09/21/2016  . Supervision of normal pregnancy 08/14/2016  . Nausea and vomiting during pregnancy 08/14/2016    Assessment / Plan: 21 y.o. G1P0 at 11022w2d here for IOL 2/2 postdates  Labor: IUPC and FSE placed @ 2120. Pt is adequately contracting (290MVU). Pelvis be limiting to a vaginal delivery but it is too early to tell.  Fetal Wellbeing:  Cat 1 Pain Control:  IV fentanyl PRN Anticipated MOD:  SVD  Sullivan LoneBrannon L Bensen [place],  04/09/2017, 9:26 PM

## 2017-04-09 NOTE — Progress Notes (Signed)
LABOR PROGRESS NOTE  Mallory RiggsGemini Willis is a 21 y.o. G1P0 at 259w2d  admitted for IOL 2/2 postdates Subjective: Pt is asleep resting comfortably   Objective: BP (!) 155/71 (BP Location: Right Arm)   Pulse 98   Temp 99.1 F (37.3 C) (Axillary)   Resp 20   Ht 5\' 9"  (1.753 m)   Wt 125.2 kg (276 lb)   LMP 06/14/2016 (Approximate)   SpO2 99%   BMI 40.76 kg/m  or  Vitals:   04/09/17 0052  BP: (!) 155/71  Pulse: 98  Resp: 20  Temp: 99.1 F (37.3 C)  TempSrc: Axillary  SpO2: 99%  Weight: 125.2 kg (276 lb)  Height: 5\' 9"  (1.753 m)    FHR: 140 moderate variability with accelerations w/o decelerations  Dilation: 1.5 Effacement (%): 50 Station: -2 Presentation: Vertex Exam by:: fran cresendo,cnm  Labs: Lab Results  Component Value Date   WBC 10.0 04/09/2017   HGB 11.7 (L) 04/09/2017   HCT 34.4 (L) 04/09/2017   MCV 85.1 04/09/2017   PLT 294 04/09/2017    Patient Active Problem List   Diagnosis Date Noted  . Post term pregnancy at [redacted] weeks gestation 04/09/2017  . Gonorrhea affecting pregnancy in third trimester 03/11/2017  . Marijuana use 09/21/2016  . Supervision of normal pregnancy 08/14/2016  . Nausea and vomiting during pregnancy 08/14/2016    Assessment / Plan: 21 y.o. G1P0 at 5359w2d here for IOL   Labor: latent, foley bulb in place, Cytotec x1 Fetal Wellbeing: cat 1  Pain Control:  Iv PRN  Anticipated MOD:  SVD  Sullivan LoneBrannon L Inman,  04/09/2017, 3:02 AM   I have seen and examined this patient and agree with the management plan.

## 2017-04-10 ENCOUNTER — Encounter (HOSPITAL_COMMUNITY): Payer: Self-pay

## 2017-04-10 ENCOUNTER — Encounter (HOSPITAL_COMMUNITY)
Admission: RE | Disposition: A | Discharge status: Home / Self Care | Payer: Self-pay | Source: Ambulatory Visit | Attending: Obstetrics and Gynecology

## 2017-04-10 DIAGNOSIS — O139 Gestational [pregnancy-induced] hypertension without significant proteinuria, unspecified trimester: Secondary | ICD-10-CM

## 2017-04-10 DIAGNOSIS — Z3A41 41 weeks gestation of pregnancy: Secondary | ICD-10-CM

## 2017-04-10 DIAGNOSIS — O99824 Streptococcus B carrier state complicating childbirth: Secondary | ICD-10-CM

## 2017-04-10 DIAGNOSIS — O480 Post-term pregnancy: Secondary | ICD-10-CM

## 2017-04-10 LAB — CBC
HCT: 33.5 % — ABNORMAL LOW (ref 36.0–46.0)
Hemoglobin: 11.5 g/dL — ABNORMAL LOW (ref 12.0–15.0)
MCH: 29.2 pg (ref 26.0–34.0)
MCHC: 34.3 g/dL (ref 30.0–36.0)
MCV: 85 fL (ref 78.0–100.0)
PLATELETS: 261 10*3/uL (ref 150–400)
RBC: 3.94 MIL/uL (ref 3.87–5.11)
RDW: 15 % (ref 11.5–15.5)
WBC: 12.7 10*3/uL — AB (ref 4.0–10.5)

## 2017-04-10 SURGERY — Surgical Case
Anesthesia: Epidural

## 2017-04-10 MED ORDER — MEPERIDINE HCL 25 MG/ML IJ SOLN
INTRAMUSCULAR | Status: DC | PRN
  Administered 2017-04-10 (×2): 12.5 mg via INTRAVENOUS

## 2017-04-10 MED ORDER — DEXTROSE 5 % IV SOLN
INTRAVENOUS | Status: DC | PRN
  Administered 2017-04-10: 3 g via INTRAVENOUS

## 2017-04-10 MED ORDER — OXYTOCIN 10 UNIT/ML IJ SOLN
INTRAMUSCULAR | Status: DC | PRN
  Administered 2017-04-10: 40 [IU] via INTRAVENOUS

## 2017-04-10 MED ORDER — ACETAMINOPHEN 325 MG PO TABS
650.0000 mg | ORAL_TABLET | ORAL | Status: DC | PRN
  Administered 2017-04-11 (×2): 650 mg via ORAL
  Filled 2017-04-10 (×3): qty 2

## 2017-04-10 MED ORDER — ZOLPIDEM TARTRATE 5 MG PO TABS: 5.0000 mg | ORAL_TABLET | Freq: Every evening | ORAL | Status: DC | PRN

## 2017-04-10 MED ORDER — NALBUPHINE HCL 10 MG/ML IJ SOLN
5.0000 mg | INTRAMUSCULAR | Status: DC | PRN
  Administered 2017-04-10 (×4): 5 mg via SUBCUTANEOUS
  Filled 2017-04-10 (×3): qty 1

## 2017-04-10 MED ORDER — SIMETHICONE 80 MG PO CHEW
80.0000 mg | CHEWABLE_TABLET | Freq: Three times a day (TID) | ORAL | Status: DC
  Administered 2017-04-10 – 2017-04-12 (×6): 80 mg via ORAL
  Filled 2017-04-10 (×12): qty 1

## 2017-04-10 MED ORDER — DIPHENHYDRAMINE HCL 50 MG/ML IJ SOLN
12.5000 mg | INTRAMUSCULAR | Status: DC | PRN
  Administered 2017-04-10: 12.5 mg via INTRAVENOUS
  Filled 2017-04-10: qty 1

## 2017-04-10 MED ORDER — ONDANSETRON HCL 4 MG/2ML IJ SOLN: 4.0000 mg | Freq: Three times a day (TID) | INTRAMUSCULAR | Status: DC | PRN

## 2017-04-10 MED ORDER — NALBUPHINE HCL 10 MG/ML IJ SOLN
5.0000 mg | INTRAMUSCULAR | Status: DC | PRN
  Filled 2017-04-10 (×2): qty 1

## 2017-04-10 MED ORDER — NALBUPHINE HCL 10 MG/ML IJ SOLN
5.0000 mg | Freq: Once | INTRAMUSCULAR | Status: AC | PRN
  Administered 2017-04-11: 5 mg via SUBCUTANEOUS
  Filled 2017-04-10: qty 1

## 2017-04-10 MED ORDER — MORPHINE SULFATE (PF) 0.5 MG/ML IJ SOLN
INTRAMUSCULAR | Status: AC
  Filled 2017-04-10: qty 10

## 2017-04-10 MED ORDER — KETOROLAC TROMETHAMINE 30 MG/ML IJ SOLN: 30.0000 mg | Freq: Four times a day (QID) | INTRAMUSCULAR | Status: DC | PRN

## 2017-04-10 MED ORDER — LORATADINE 10 MG PO TABS
10.0000 mg | ORAL_TABLET | Freq: Every day | ORAL | Status: DC
  Filled 2017-04-10 (×2): qty 1

## 2017-04-10 MED ORDER — COCONUT OIL OIL
1.0000 "application " | TOPICAL_OIL | Status: DC | PRN
  Filled 2017-04-10: qty 120

## 2017-04-10 MED ORDER — OXYTOCIN 10 UNIT/ML IJ SOLN
INTRAMUSCULAR | Status: AC
Start: 2017-04-10 — End: 2017-04-10
  Filled 2017-04-10: qty 4

## 2017-04-10 MED ORDER — DEXAMETHASONE SODIUM PHOSPHATE 10 MG/ML IJ SOLN
INTRAMUSCULAR | Status: AC
  Filled 2017-04-10: qty 1

## 2017-04-10 MED ORDER — SODIUM BICARBONATE 8.4 % IV SOLN
INTRAVENOUS | Status: DC | PRN
  Administered 2017-04-10: 5 mL via EPIDURAL
  Administered 2017-04-10: 10 mL via EPIDURAL

## 2017-04-10 MED ORDER — DIBUCAINE 1 % RE OINT
1.0000 "application " | TOPICAL_OINTMENT | RECTAL | Status: DC | PRN
  Filled 2017-04-10: qty 28

## 2017-04-10 MED ORDER — ACETAMINOPHEN 500 MG PO TABS
1000.0000 mg | ORAL_TABLET | Freq: Four times a day (QID) | ORAL | Status: AC
  Administered 2017-04-10 – 2017-04-11 (×4): 1000 mg via ORAL
  Filled 2017-04-10 (×4): qty 2

## 2017-04-10 MED ORDER — LACTATED RINGERS IV SOLN
INTRAVENOUS | Status: DC | PRN
  Administered 2017-04-10: 01:00:00 via INTRAVENOUS

## 2017-04-10 MED ORDER — KETOROLAC TROMETHAMINE 30 MG/ML IJ SOLN
INTRAMUSCULAR | Status: AC
  Administered 2017-04-10: 30 mg via INTRAMUSCULAR
  Filled 2017-04-10: qty 1

## 2017-04-10 MED ORDER — NALOXONE HCL 0.4 MG/ML IJ SOLN: 0.4000 mg | INTRAMUSCULAR | Status: DC | PRN

## 2017-04-10 MED ORDER — TETANUS-DIPHTH-ACELL PERTUSSIS 5-2.5-18.5 LF-MCG/0.5 IM SUSP
0.5000 mL | Freq: Once | INTRAMUSCULAR | Status: DC
  Filled 2017-04-10: qty 0.5

## 2017-04-10 MED ORDER — DEXAMETHASONE SODIUM PHOSPHATE 10 MG/ML IJ SOLN
INTRAMUSCULAR | Status: DC | PRN
  Administered 2017-04-10: 10 mg via INTRAVENOUS

## 2017-04-10 MED ORDER — SODIUM CHLORIDE 0.9 % IR SOLN
Status: DC | PRN
  Administered 2017-04-10: 400 mL

## 2017-04-10 MED ORDER — MORPHINE SULFATE (PF) 0.5 MG/ML IJ SOLN
INTRAMUSCULAR | Status: DC | PRN
  Administered 2017-04-10: 1 mg via INTRAVENOUS
  Administered 2017-04-10: 4 mg via EPIDURAL

## 2017-04-10 MED ORDER — SIMETHICONE 80 MG PO CHEW
80.0000 mg | CHEWABLE_TABLET | ORAL | Status: DC
  Administered 2017-04-10 – 2017-04-12 (×2): 80 mg via ORAL
  Filled 2017-04-10 (×4): qty 1

## 2017-04-10 MED ORDER — PRENATAL MULTIVITAMIN CH
1.0000 | ORAL_TABLET | Freq: Every day | ORAL | Status: DC
  Administered 2017-04-11: 1 via ORAL
  Filled 2017-04-10 (×4): qty 1

## 2017-04-10 MED ORDER — KETOROLAC TROMETHAMINE 30 MG/ML IJ SOLN
30.0000 mg | Freq: Four times a day (QID) | INTRAMUSCULAR | Status: DC | PRN
  Administered 2017-04-10: 30 mg via INTRAMUSCULAR

## 2017-04-10 MED ORDER — DIPHENHYDRAMINE HCL 25 MG PO CAPS: 25.0000 mg | ORAL_CAPSULE | ORAL | Status: DC | PRN

## 2017-04-10 MED ORDER — MEPERIDINE HCL 25 MG/ML IJ SOLN
INTRAMUSCULAR | Status: AC
  Filled 2017-04-10: qty 1

## 2017-04-10 MED ORDER — SCOPOLAMINE 1 MG/3DAYS TD PT72
MEDICATED_PATCH | TRANSDERMAL | Status: DC | PRN
  Administered 2017-04-10: 1 via TRANSDERMAL

## 2017-04-10 MED ORDER — SIMETHICONE 80 MG PO CHEW
80.0000 mg | CHEWABLE_TABLET | ORAL | Status: DC | PRN
  Filled 2017-04-10: qty 1

## 2017-04-10 MED ORDER — SCOPOLAMINE 1 MG/3DAYS TD PT72
1.0000 | MEDICATED_PATCH | Freq: Once | TRANSDERMAL | Status: DC
  Filled 2017-04-10: qty 1

## 2017-04-10 MED ORDER — NALOXONE HCL 2 MG/2ML IJ SOSY: 1.0000 ug/kg/h | PREFILLED_SYRINGE | INTRAVENOUS | Status: DC | PRN

## 2017-04-10 MED ORDER — DEXTROSE 5 % IV SOLN
INTRAVENOUS | Status: AC
  Filled 2017-04-10: qty 3000

## 2017-04-10 MED ORDER — NALBUPHINE HCL 10 MG/ML IJ SOLN: 5.0000 mg | Freq: Once | INTRAMUSCULAR | Status: AC | PRN

## 2017-04-10 MED ORDER — SCOPOLAMINE 1 MG/3DAYS TD PT72
MEDICATED_PATCH | TRANSDERMAL | Status: AC
  Filled 2017-04-10: qty 1

## 2017-04-10 MED ORDER — PROMETHAZINE HCL 25 MG/ML IJ SOLN: 6.2500 mg | INTRAMUSCULAR | Status: DC | PRN

## 2017-04-10 MED ORDER — SENNOSIDES-DOCUSATE SODIUM 8.6-50 MG PO TABS
2.0000 | ORAL_TABLET | ORAL | Status: DC
  Administered 2017-04-10 – 2017-04-12 (×2): 2 via ORAL
  Filled 2017-04-10 (×4): qty 2

## 2017-04-10 MED ORDER — SODIUM CHLORIDE 0.9% FLUSH: 3.0000 mL | INTRAVENOUS | Status: DC | PRN

## 2017-04-10 MED ORDER — WITCH HAZEL-GLYCERIN EX PADS
1.0000 | MEDICATED_PAD | CUTANEOUS | Status: DC | PRN
Start: 2017-04-10 — End: 2017-04-12

## 2017-04-10 MED ORDER — MENTHOL 3 MG MT LOZG
1.0000 | LOZENGE | OROMUCOSAL | Status: DC | PRN
  Filled 2017-04-10: qty 9

## 2017-04-10 MED ORDER — LACTATED RINGERS IV SOLN
INTRAVENOUS | Status: DC
  Administered 2017-04-10: 22:00:00 via INTRAVENOUS

## 2017-04-10 MED ORDER — DIPHENHYDRAMINE HCL 25 MG PO CAPS
25.0000 mg | ORAL_CAPSULE | Freq: Four times a day (QID) | ORAL | Status: DC | PRN
  Filled 2017-04-10: qty 1

## 2017-04-10 MED ORDER — ONDANSETRON HCL 4 MG/2ML IJ SOLN
INTRAMUSCULAR | Status: DC | PRN
  Administered 2017-04-10: 4 mg via INTRAVENOUS

## 2017-04-10 MED ORDER — FENTANYL CITRATE (PF) 100 MCG/2ML IJ SOLN: 25.0000 ug | INTRAMUSCULAR | Status: DC | PRN

## 2017-04-10 MED ORDER — IBUPROFEN 600 MG PO TABS
600.0000 mg | ORAL_TABLET | Freq: Four times a day (QID) | ORAL | Status: DC
  Administered 2017-04-10 – 2017-04-12 (×10): 600 mg via ORAL
  Filled 2017-04-10 (×10): qty 1

## 2017-04-10 MED ORDER — MEPERIDINE HCL 25 MG/ML IJ SOLN: 6.2500 mg | INTRAMUSCULAR | Status: DC | PRN

## 2017-04-10 MED ORDER — OXYTOCIN 40 UNITS IN LACTATED RINGERS INFUSION - SIMPLE MED: 2.5000 [IU]/h | INTRAVENOUS | Status: AC

## 2017-04-10 MED ORDER — ONDANSETRON HCL 4 MG/2ML IJ SOLN
INTRAMUSCULAR | Status: AC
  Filled 2017-04-10: qty 2

## 2017-04-10 SURGICAL SUPPLY — 39 items
APL SKNCLS STERI-STRIP NONHPOA (GAUZE/BANDAGES/DRESSINGS) ×1
APPLICATOR COTTON TIP 6IN STRL (MISCELLANEOUS) ×3 IMPLANT
BENZOIN TINCTURE PRP APPL 2/3 (GAUZE/BANDAGES/DRESSINGS) ×2 IMPLANT
CHLORAPREP W/TINT 26ML (MISCELLANEOUS) ×3 IMPLANT
CLAMP CORD UMBIL (MISCELLANEOUS) IMPLANT
CLOSURE STERI STRIP 1/2 X4 (GAUZE/BANDAGES/DRESSINGS) ×2 IMPLANT
CLOSURE WOUND 1/2 X4 (GAUZE/BANDAGES/DRESSINGS)
CLOTH BEACON ORANGE TIMEOUT ST (SAFETY) ×3 IMPLANT
DRSG OPSITE POSTOP 4X10 (GAUZE/BANDAGES/DRESSINGS) ×3 IMPLANT
ELECT REM PT RETURN 9FT ADLT (ELECTROSURGICAL) ×3
ELECTRODE REM PT RTRN 9FT ADLT (ELECTROSURGICAL) ×1 IMPLANT
EXTRACTOR VACUUM KIWI (MISCELLANEOUS) IMPLANT
GLOVE BIO SURGEON ST LM GN SZ9 (GLOVE) ×3 IMPLANT
GLOVE BIOGEL PI IND STRL 7.0 (GLOVE) ×1 IMPLANT
GLOVE BIOGEL PI IND STRL 9 (GLOVE) ×1 IMPLANT
GLOVE BIOGEL PI INDICATOR 7.0 (GLOVE) ×2
GLOVE BIOGEL PI INDICATOR 9 (GLOVE) ×2
GOWN STRL REUS W/TWL 2XL LVL3 (GOWN DISPOSABLE) ×3 IMPLANT
GOWN STRL REUS W/TWL LRG LVL3 (GOWN DISPOSABLE) ×3 IMPLANT
HOVERMATT HALF SINGLE USE (PATIENT TRANSFER) ×2 IMPLANT
NEEDLE HYPO 25X5/8 SAFETYGLIDE (NEEDLE) IMPLANT
NS IRRIG 1000ML POUR BTL (IV SOLUTION) ×3 IMPLANT
PACK C SECTION WH (CUSTOM PROCEDURE TRAY) ×3 IMPLANT
PAD OB MATERNITY 4.3X12.25 (PERSONAL CARE ITEMS) ×3 IMPLANT
PENCIL SMOKE EVAC W/HOLSTER (ELECTROSURGICAL) ×3 IMPLANT
RTRCTR C-SECT PINK 25CM LRG (MISCELLANEOUS) IMPLANT
RTRCTR C-SECT PINK 34CM XLRG (MISCELLANEOUS) IMPLANT
STRIP CLOSURE SKIN 1/2X4 (GAUZE/BANDAGES/DRESSINGS) IMPLANT
SUT MNCRL 0 VIOLET CTX 36 (SUTURE) ×2 IMPLANT
SUT MONOCRYL 0 CTX 36 (SUTURE) ×4
SUT PLAIN 2 0 XLH (SUTURE) ×3 IMPLANT
SUT VIC AB 0 CT1 27 (SUTURE) ×3
SUT VIC AB 0 CT1 27XBRD ANBCTR (SUTURE) ×1 IMPLANT
SUT VIC AB 2-0 CT1 27 (SUTURE) ×3
SUT VIC AB 2-0 CT1 TAPERPNT 27 (SUTURE) ×1 IMPLANT
SUT VIC AB 4-0 KS 27 (SUTURE) ×3 IMPLANT
SYR BULB IRRIGATION 50ML (SYRINGE) IMPLANT
TOWEL OR 17X24 6PK STRL BLUE (TOWEL DISPOSABLE) ×3 IMPLANT
TRAY FOLEY BAG SILVER LF 14FR (SET/KITS/TRAYS/PACK) ×3 IMPLANT

## 2017-04-10 NOTE — Anesthesia Postprocedure Evaluation (Signed)
Anesthesia Post Note  Patient: Nilda RiggsGemini Gunn  Procedure(s) Performed: Procedure(s) (LRB): CESAREAN SECTION (N/A)     Patient location during evaluation: Mother Baby Anesthesia Type: Epidural Level of consciousness: awake and alert and oriented Pain management: pain level controlled Vital Signs Assessment: post-procedure vital signs reviewed and stable Respiratory status: spontaneous breathing and nonlabored ventilation Cardiovascular status: stable Postop Assessment: no headache, patient able to bend at knees, no signs of nausea or vomiting, no backache, epidural receding and adequate PO intake Anesthetic complications: no    Last Vitals:  Vitals:   04/10/17 0519 04/10/17 0626  BP: 129/79 139/80  Pulse: 64 83  Resp: 20 20  Temp: 36.8 C 36.9 C    Last Pain:  Vitals:   04/10/17 0626  TempSrc: Oral  PainSc: 0-No pain   Pain Goal:                 Land O'LakesMalinova,Haskell Rihn Hristova

## 2017-04-10 NOTE — Brief Op Note (Signed)
04/09/2017 - 04/10/2017  2:04 AM  PATIENT:  Mallory Willis  21 y.o. female  PRE-OPERATIVE DIAGNOSIS:  CESAREAN SECTION pregnancy 41 weeks 3 days failed induction  POST-OPERATIVE DIAGNOSIS:  CESAREAN SECTION pregnancy 41 weeks 3 days delivered failed induction Failure to progress and suspected cephalopelvic disproportion  PROCEDURE:  Procedure(s): CESAREAN SECTION (N/A)  SURGEON:  Surgeon(s) and Role:    * Tilda BurrowFerguson, Aedin Jeansonne V, MD - Primary  PHYSICIAN ASSISTANT:   ASSISTANTS: Lilian KapurBrannon Inman MS 3   ANESTHESIA:   epidural  EBL:  Total I/O In: 2000 [I.V.:2000] Out: 700 [Urine:100; Blood:600]  BLOOD ADMINISTERED:none  DRAINS: Urinary Catheter (Foley)   LOCAL MEDICATIONS USED:  NONE  SPECIMEN:  No Specimen  DISPOSITION OF SPECIMEN:  Placenta to labor and delivery  COUNTS:  YES  TOURNIQUET:  * No tourniquets in log *  DICTATION: .Dragon Dictation  PLAN OF CARE: Patient has a active inpatient orders  PATIENT DISPOSITION:  PACU - hemodynamically stable.   Delay start of Pharmacological VTE agent (>24hrs) due to surgical blood loss or risk of bleeding: not applicable Indications: Arrest of labor at 6 cm 80% -2 vertex presentation with molded vertex and failure to progress over 3-1/2 hours of optimal labor by IUPC Details of procedure patient was taken operating room prepped and draped for lower abdominal surgery was conducted. Palpation abdomen revealed the to the vertex never really entered the pelvis. First lower abdominal incision was performed after timeout conducted and the patient has received 3 g Ancef intravenous. Transverse lower abdominal incision was performed down to the fascia which was opened transversely and peritoneum opened in the midline without difficulty. Alexis wound retractor was positioned, and bladder flap developed on the lower uterine segment followed by transverse uterine incision in the thinned out lower uterine segment and fetal vertex which was quite molded  was delivered vision and delivered by fundal pressure. The molded area was slightly asynclitic. Baby would've entered the pelvis as an OP presentation. After 1 minute cord was clamped and the baby passed to the waiting pediatricians. Apgars 8 and 9 were assigned. There was no malodor to the amniotic fluid. The cord blood samples were obtained and the placenta delivered with per day uterine massage. There was a membrane remnants that required individual extraction. After swabbing out the uterus thoroughly the uterus was irrigated with saline solution. The uterine incision was closed in a running locking first layer beginning at each angle and sewing to the midline. The second layer was continuous running oversewing good tissue approximation and hemostasis. These layers were sewn with 0 Monocryl. The tube was irrigated, the anterior peritoneum closed using running 2-0 Vicryl, the fascia reapproximated using running 0 Vicryl, subcutaneous tissues reapproximated using 3 interrupted sutures of 20 plain followed by subcuticular 4-0 Vicryl closure the skin incisions. Patient tolerated procedure well and went to recovery in good shape. Steri-Strips and benzoin were applied before honeycomb dressing was placed sponge and needle counts were correct throughout and patient went to recovery room in good condition

## 2017-04-10 NOTE — Op Note (Signed)
Please see the brief operative note for surgical details 

## 2017-04-10 NOTE — Consult Note (Signed)
[facility]  Delivery Note:  C-section       04/10/2017  1:18 AM  I was called to the operating room at the request of the patient's obstetrician (Dr. Emelda FearFerguson) for a primary c-section.  PRENATAL HX:  This is a 21 y/o G1P0 at 6941 and 3/[redacted] weeks gestation who was admitted for IOL due to post dates.  She is GBS positive, resistant to clindamycin, and has received 2 doses of Vancomycin.  ROM x 9 hours.    DELIVERY:  Infant was vigorous at delivery, requiring no resuscitation other than standard warming, drying and stimulation.  Infant still had cyanosis at 5 minutes of life so a pulse oximeter was applied.  O2 saturations in 70s, blow by O2 given from 7 to 8 minutes.  O2 saturaions came up to 90s and remained in 90s in RA.  APGARs 8, 8 and 9.  Exam notable for molding, otherwise was within normal limits.  After 10 minutes, baby left with nurse to assist parents with skin-to-skin care.   _____________________ Electronically Signed By: Maryan CharLindsey Kierrah Kilbride, MD Neonatologist

## 2017-04-10 NOTE — Transfer of Care (Signed)
Immediate Anesthesia Transfer of Care Note  Patient: Mallory Willis  Procedure(s) Performed: Procedure(s): CESAREAN SECTION (N/A)  Patient Location: PACU  Anesthesia Type:Epidural  Level of Consciousness: awake, alert  and oriented  Airway & Oxygen Therapy: Patient Spontanous Breathing  Post-op Assessment: Report given to RN and Post -op Vital signs reviewed and stable  Post vital signs: Reviewed and stable  Last Vitals:  Vitals:   04/10/17 0051 04/10/17 0053  BP: (!) 150/96 (!) 161/95  Pulse: (!) 102 (!) 117  Resp:    Temp:      Last Pain:  Vitals:   04/09/17 2346  TempSrc: Oral  PainSc: 0-No pain         Complications: No apparent anesthesia complications

## 2017-04-10 NOTE — Progress Notes (Signed)
Mallory Willis is a 21 y.o. G1P0 at 7354w3d by  admitted for induction of labor due to Post dates. Due date 7/18.  Subjective: Pt has effective epidural, with optimal contractions, MVU's >200.    Objective: BP (!) 131/57   Pulse 82   Temp 98.6 F (37 C) (Oral)   Resp 17   Ht 5\' 9"  (1.753 m)   Wt 276 lb (125.2 kg)   LMP 06/14/2016 (Approximate)   SpO2 99%   BMI 40.76 kg/m  No intake/output data recorded. No intake/output data recorded.  FHT:  FHR: 140 bpm, variability: moderate,  accelerations:  Present,  decelerations:  Absent UC:   regular, every 3 minutes SVE:   Dilation: 6 Effacement (%): 80 Station: -2, -1 Exam by:: dr Emelda Fearferguson Pt has moulding, and vertex has absolutely no descent with the contractions.   Pelvis is narrow, and vertex impacted at inlet.  Labs: Lab Results  Component Value Date   WBC 10.0 04/09/2017   HGB 11.7 (L) 04/09/2017   HCT 34.4 (L) 04/09/2017   MCV 85.1 04/09/2017   PLT 294 04/09/2017    Assessment / Plan: Arrest in active phase of labor  Labor: Cephalopelvic Disproportion present (FTP) Preeclampsia:   Fetal Wellbeing:  Category I Pain Control:  Epidural I/D:  n/a Anticipated MOD:  cesarean section recommended, with alternatives of allowing another hour and rechecking, discussed.  Risks, potential complications discussed . Pt agrees to cesarean section.  OR notified.  Anessia Oakland V 04/10/2017, 12:38 AM

## 2017-04-10 NOTE — Lactation Note (Signed)
This note was copied from a baby's chart. Lactation Consultation Note  Patient Name: Girl Nilda RiggsGemini Peters [id number]'s Date: 04/10/2017 Reason for consult: Initial assessment   With this first time mom of a term baby, now 2413 hours old. Mom has been latching baby in cradle hold, and bring her breast to the baby, resulting in a shallow latch. Mom agreed to trying cross cradle, obtained a much deeper latch, more comfortable, and baby maintained latch better. Basic breast feeding teaching done with mom, as well as lactation review. Mom knows to call for questions/concerns.   Maternal Data Formula Feeding for Exclusion: No Has patient been taught Hand Expression?: Yes Does the patient have breastfeeding experience prior to this delivery?: No  Feeding Feeding Type: Breast Fed Length of feed: 10 min  LATCH Score/Interventions Latch: Repeated attempts needed to sustain latch, nipple held in mouth throughout feeding, stimulation needed to elicit sucking reflex. (mom was shown cross cradle to latch,with a deep latch) Intervention(s): Adjust position;Assist with latch  Audible Swallowing: A few with stimulation  Type of Nipple: Everted at rest and after stimulation  Comfort (Breast/Nipple): Soft / non-tender     Hold (Positioning): Assistance needed to correctly position infant at breast and maintain latch. Intervention(s): Breastfeeding basics reviewed;Support Pillows;Position options;Skin to skin  LATCH Score: 7  Lactation Tools Discussed/Used WIC Program: Yes Pump Review: Setup, frequency, and cleaning Date initiated:: 04/10/17   Consult Status Consult Status: Follow-up Date: 04/11/17 Follow-up type: In-patient    Alfred LevinsLee, Laikyn Gewirtz Anne 04/10/2017, 2:40 PM

## 2017-04-11 ENCOUNTER — Encounter (HOSPITAL_COMMUNITY): Payer: Self-pay

## 2017-04-11 LAB — CBC
HCT: 31.2 % — ABNORMAL LOW (ref 36.0–46.0)
HEMOGLOBIN: 10.4 g/dL — AB (ref 12.0–15.0)
MCH: 29.1 pg (ref 26.0–34.0)
MCHC: 33.3 g/dL (ref 30.0–36.0)
MCV: 87.2 fL (ref 78.0–100.0)
Platelets: 243 10*3/uL (ref 150–400)
RBC: 3.58 MIL/uL — ABNORMAL LOW (ref 3.87–5.11)
RDW: 15.1 % (ref 11.5–15.5)
WBC: 11.4 10*3/uL — AB (ref 4.0–10.5)

## 2017-04-11 MED ORDER — OXYCODONE HCL 5 MG PO TABS
10.0000 mg | ORAL_TABLET | ORAL | Status: DC | PRN
  Administered 2017-04-11 – 2017-04-12 (×4): 10 mg via ORAL
  Filled 2017-04-11 (×5): qty 2

## 2017-04-11 MED ORDER — OXYCODONE HCL 5 MG PO TABS
5.0000 mg | ORAL_TABLET | ORAL | Status: DC | PRN
  Administered 2017-04-11 – 2017-04-12 (×4): 5 mg via ORAL
  Filled 2017-04-11 (×3): qty 1

## 2017-04-11 NOTE — Clinical Social Work Maternal (Addendum)
  CLINICAL SOCIAL WORK MATERNAL/CHILD NOTE  Patient Details  Name: Mallory Willis MRN: [id number] Date of Birth: April 24, 1996  Date:  04/11/2017  Clinical Social Worker Initiating Note:  Ferdinand Lango Taylen Osorto, MSW, LCSW-A  Date/ Time Initiated:  04/11/17/1437     Child's Name:  Mallory Willis    Legal Guardian:  Other (Comment) (Not established by court system; MOB and FOB are not together currently; however, FOB was present. FOB- Mallory Willis DOB 06/[birth year])   Need for Interpreter:  None   Date of Referral:  04/10/17     Reason for Referral:  Current Substance Use/Substance Use During Pregnancy , Other (Comment) (Baby UDS (+) for [facility] )   Referral Source:  RN   Address:  [address] [place], [place] [address]  Phone number:  [phone]   Household Members:  Self, Parents   Natural Supports (not living in the home):  Extended Family, Friends, Immediate Family, Other (Comment) (FOB- Mallory Willis [birth year])   Professional Supports:     Employment: Unemployed   Type of Work: Unemployed    Education:  Database administrator Resources:  Medicaid   Other Resources:  [employer], Food Stamps    Cultural/Religious Considerations Which May Impact Care:  Unknown per face sheet. None reported to this Probation officer.   Strengths:  Ability to meet basic needs , Pediatrician chosen , Compliance with medical plan , Home prepared for child  ([facility] )   Risk Factors/Current Problems:  Family/Relationship Issues , Substance Use  (FOB was escorted off the property due to yelling and throwing objects around hospital room and making threats )   Cognitive State:  Alert , Able to Concentrate , Insightful , Goal Oriented    Mood/Affect:  Calm , Comfortable , Interested    CSW Assessment: CSW met with MOB at bedside to complete assessment for consult regarding hx of THC use and concerns for FOB's behaviors on the unit. This Probation officer explained role and  reasoning for visit. MOB was accompanied by her mother and a friend. MOB was warm and welcoming. This Probation officer inquired about substance use hx. MOB was fourth coming noting she does use substance recreationally. This Probation officer informed MOB of the hospitals policy and procedure regarding drug use and mandated reporting. CSW explained to MOB that baby's UDS was (+) for [facility] thus warranting a CPS report. MOB understood.   CSW additionally inquired about MOB and FOB relationship being that he has been verbally aggressive and expressed escalated angry behavior by saying threats aloud and throwing objects around the room. MOB noted he was escorted out by security and he is not welcomed back. MOB gave this writer the impression that FOB would no longer be involved. This Probation officer educated MOB on PPD and SIDS/safe sleeping. MOB expressed understanding of both.   CSW made a DSS report to [place] on call CPS worker for baby's (+) UDS for THC. CPS is aware MOB and baby will likely d/c home tomorrow and will follow-up either in the home upon d/c from the hospital or at the hospital bedside. At this time, no other needs addressed or requested. Case closed to this CSW.   CSW Plan/Description:  Child Copy Report , Information/Referral to [employer] , [employer] , No Further Intervention Required/No Barriers to Discharge     [employer], MSW, [facility]  Office: [phone]

## 2017-04-11 NOTE — Anesthesia Postprocedure Evaluation (Signed)
Anesthesia Post Note  Patient: Mallory Willis  Procedure(s) Performed: Procedure(s) (LRB): CESAREAN SECTION (N/A)     Patient location during evaluation: PACU Anesthesia Type: Epidural Level of consciousness: awake, awake and alert and oriented Pain management: pain level controlled Vital Signs Assessment: post-procedure vital signs reviewed and stable Respiratory status: spontaneous breathing, nonlabored ventilation and respiratory function stable Cardiovascular status: stable Postop Assessment: no headache, no backache, epidural receding, patient able to bend at knees and no signs of nausea or vomiting Anesthetic complications: no    Last Vitals:  Vitals:   04/10/17 2126 04/11/17 0542  BP: 139/77 137/87  Pulse: 70 70  Resp: 18 18  Temp: 37.4 C 36.8 C    Last Pain:  Vitals:   04/11/17 0645  TempSrc:   PainSc: 8    Pain Goal:                 Cecile HearingStephen Edward Ziyan Schoon

## 2017-04-11 NOTE — Lactation Note (Signed)
This note was copied from a baby's chart. Lactation Consultation Note  Patient Name: Mallory Nilda RiggsGemini Zahler NWGNF'AToday's Date: 04/11/2017 Reason for consult: Follow-up assessment  Follow up visit at 34 hours of age.  Mom is reporting she doesn't know what she wants to do with pumping.  Mom tried pumping and then gave baby a bottle of 32mls formula.  Mom reports she has a DEBP at home.  LC asked how to help mom with her breastfeeding goals and mom is unsure and not receptive to conversation at this time.  Mom denies further concerns or questions at this time.  MGM asked about storage of breast milk and mom was able to tell her appropriately.  Mom reports she is just wanting to go home. Mom aware [facility].   RN, Susie reports mom will be awaiting SW consult prior to discharge.      Maternal Data Has patient been taught Hand Expression?: Yes  Feeding Feeding Type: Bottle Fed - Formula Nipple Type: Slow - flow Length of feed: 20 min  LATCH Score/Interventions Latch: Repeated attempts needed to sustain latch, nipple held in mouth throughout feeding, stimulation needed to elicit sucking reflex. Intervention(s): Adjust position;Assist with latch;Breast massage;Breast compression  Audible Swallowing: None (1 audible swallow)  Type of Nipple: Everted at rest and after stimulation (short evert)  Comfort (Breast/Nipple): Soft / non-tender (mom reports some previous latches hurting)     Hold (Positioning): Assistance needed to correctly position infant at breast and maintain latch. Intervention(s): Breastfeeding basics reviewed;Support Pillows;Position options;Skin to skin  LATCH Score: 6  Lactation Tools Discussed/Used WIC Program: Yes [facility]([place])   Consult Status Consult Status: Follow-up Date: 04/11/17 Follow-up type: In-patient    Leighann Amadon, Arvella MerlesJana Lynn 04/11/2017, 12:05 PM

## 2017-04-11 NOTE — Lactation Note (Signed)
This note was copied from a baby's chart. Lactation Consultation Note  Patient Name: Girl Nilda RiggsGemini Kinkaid ZOXWR'UToday's Date: 04/11/2017 Reason for consult: Follow-up assessment  Follow up visit at 32 hours of age. Mom reports last feeding several hours ago and baby is asleep in crib at bedside.  Baby does not have recorded void or stool in 12 hours with reports of diaper change with weight check, but mom unsure if diaper was wet or dirty.  Baby is at 4.6% weight loss just over 24 hours of age.  LC offered to assist with waking baby and latching.  Mom reports appropriate feedings cues and some soreness with some latches.  LC undressed and burped baby and then placed baby STS with mom in football hold on left breast.  Mom attempted latch with shallow gape and chin to chest with few sucks and baby stops while holding nipple in mouth.  Mom continues to need basics of hold and latching demonstrated with hands on assist.  This is moms 1st baby, Mom reports being active with WIC in [place].  Mom reports wanting to go home today and stated " I will just bottle feed so I can go home if I have to."  LC explored moms feeding goals and she is wanting to pump and bottle feed at home because latching is "not for me" she reports.  LC encouraged mom that is may take assistance and practice to become more comfortable with breastfeeding.  LC offered to have DEBP set up to help with breast stimulation as baby is not noted to be feeding well at this time.  LC reported to RN, Susie to set up DEBP. LC noted short anterior lingual frenulum with limited extension, some cupping of gloved finger and some "snap back" noted with suck training. Mom denies hand expressing or spoon feeding previously.   Baby improved latch some in football hold on right breast, with one swallow observed and slightly increased, but intermittent sucking.  Baby is not active for feeding at this time and appears more sleepy.  Mom has supportive mom at bedside who  reports experience with breastfeeding this mom for 11 months and appears supportive.   Plan if for  mom to start with DEBP and hand expression, offer spoon feeding supplement and further assess tongue function with latching.  Mom may need nipple shield and baby may need supplementing.     Maternal Data Has patient been taught Hand Expression?: Yes  Feeding Feeding Type: Breast Fed Length of feed:  (few minutes of sucking)  LATCH Score/Interventions Latch: Repeated attempts needed to sustain latch, nipple held in mouth throughout feeding, stimulation needed to elicit sucking reflex. Intervention(s): Adjust position;Assist with latch;Breast massage;Breast compression  Audible Swallowing: None (1 audible swallow)  Type of Nipple: Everted at rest and after stimulation (short evert)  Comfort (Breast/Nipple): Soft / non-tender (mom reports some previous latches hurting)     Hold (Positioning): Assistance needed to correctly position infant at breast and maintain latch. Intervention(s): Breastfeeding basics reviewed;Support Pillows;Position options;Skin to skin  LATCH Score: 6  Lactation Tools Discussed/Used WIC Program: Yes [facility]([place])   Consult Status Consult Status: Follow-up Date: 04/11/17 Follow-up type: In-patient    Georgios Kina, Arvella MerlesJana Lynn 04/11/2017, 10:03 AM

## 2017-04-11 NOTE — Progress Notes (Signed)
Post-op Day 1 pLTCS for failure to progress and CPD  Subjective:  Mallory Willis is a 21 y.o. G1P1001 7072w3d s/p pLTCS.  No acute events overnight.  Pt denies problems with ambulating, voiding or po intake.  She denies nausea or vomiting.  Pain is well controlled.  She has had flatus. She has not had bowel movement.  Lochia Small.  Method of Feeding: Breast. Requesting to be discharged today.   Objective: BP 137/87   Pulse 70   Temp 98.2 F (36.8 C) (Axillary)   Resp 18   Ht 5\' 9"  (1.753 m)   Wt 276 lb (125.2 kg)   LMP 06/14/2016 (Approximate)   SpO2 100%   Breastfeeding? Unknown   BMI 40.76 kg/m   Physical Exam:  General: alert, cooperative and no distress Lochia:normal flow Chest: normal work of breathing Heart: Regular rate Abdomen: +BS, soft, nontender Uterine Fundus: firm DVT Evaluation: No evidence of DVT seen on physical exam. Extremities: trace edema   Recent Labs  04/10/17 0259 04/11/17 0523  HGB 11.5* 10.4*  HCT 33.5* 31.2*    Assessment/Plan:  ASSESSMENT: Mallory RiggsGemini Mcnamara is a 21 y.o. G1P1001 4472w3d pod #1 s/p LTCS doing well.   Continue routine postpartum care Plan for discharge alter today if baby discharged   LOS: 2 days   Caryl AdaJazma Tylee Yum, DO OB Fellow Faculty Practice, [facility] 04/11/2017, 8:30 AM

## 2017-04-12 ENCOUNTER — Encounter (HOSPITAL_COMMUNITY): Payer: Self-pay

## 2017-04-12 MED ORDER — IBUPROFEN 600 MG PO TABS: 600.0000 mg | ORAL_TABLET | Freq: Four times a day (QID) | ORAL | 0 refills | Status: DC | PRN

## 2017-04-12 MED ORDER — OXYCODONE HCL 5 MG PO TABS: 5.0000 mg | ORAL_TABLET | ORAL | 0 refills | Status: DC | PRN

## 2017-04-12 NOTE — Discharge Summary (Signed)
OB Discharge Summary     Patient Name: Nilda RiggsGemini Salva DOB: 06/12/1996 MRN: [id number]  Date of admission: 04/09/2017 Delivering MD: Tilda BurrowFERGUSON, JOHN V   Date of discharge: 04/12/2017  Admitting diagnosis: INDUCTION Intrauterine pregnancy: 8034w3d     Secondary diagnosis:  Active Problems:   Post term pregnancy at [redacted] weeks gestation   Gestational hypertension (gHTN dx at time of postdates IOL)  Additional problems: GBS pos, clinda resistant; +THC during preg     Discharge diagnosis: Term Pregnancy Delivered                                                                                                Post partum procedures:none  Augmentation: AROM, Pitocin, Cytotec and Foley Balloon  Complications: None  Hospital course:  Induction of Labor With Cesarean Section  21 y.o. yo G1P1001 at 7134w3d was admitted to the hospital 04/09/2017 for induction of labor. During her stay on L&D her BPs were noted to be up, but not in severe range. Pre-e labs were negative (P/C ratio 0.29). Patient had a labor course significant for reaching 6cm with the usual IOL methods, and then not progressing despite proven adequate ctx for multiple hours. The patient went for cesarean section due to Arrest of Dilation, and delivered a Viable infant, Membrane Rupture Time/Date: )4:30 PM ,04/09/2017   @Details  of operation can be found in separate operative Note.  Patient had an uncomplicated postpartum course. She is ambulating, tolerating a regular diet, passing flatus, and urinating well.  Patient is discharged home in stable condition on 04/12/17.  She desires early d/c, and her pp BPs have been within nl range.                              Physical exam  Vitals:   04/10/17 2126 04/11/17 0542 04/11/17 1830 04/12/17 0533  BP: 139/77 137/87 137/76 140/68  Pulse: 70 70 74 72  Resp: 18 18 18 18   Temp: 99.4 F (37.4 C) 98.2 F (36.8 C) 98.1 F (36.7 C) 98 F (36.7 C)  TempSrc: Axillary Axillary Oral Oral  SpO2:  100%     Weight:      Height:       General: alert and cooperative Lochia: appropriate Uterine Fundus: firm Incision: honeycomb intact, stained and marked in sm areas DVT Evaluation: No evidence of DVT seen on physical exam. Labs: Lab Results  Component Value Date   WBC 11.4 (H) 04/11/2017   HGB 10.4 (L) 04/11/2017   HCT 31.2 (L) 04/11/2017   MCV 87.2 04/11/2017   PLT 243 04/11/2017   CMP Latest Ref Rng & Units 04/09/2017  Glucose 65 - 99 mg/dL 93  BUN 6 - 20 mg/dL 11  Creatinine 4.090.44 - 8.111.00 mg/dL 9.140.58  Sodium 782135 - 956145 mmol/L 135  Potassium 3.5 - 5.1 mmol/L 4.0  Chloride 101 - 111 mmol/L 105  CO2 22 - 32 mmol/L 23  Calcium 8.9 - 10.3 mg/dL 9.0  Total Protein 6.5 - 8.1 g/dL 6.5  Total Bilirubin 0.3 - 1.2 mg/dL 2.1(H0.1(L)  Alkaline Phos 38 - 126 U/L 246(H)  AST 15 - 41 U/L 17  ALT 14 - 54 U/L 8(L)    Discharge instruction: per After Visit Summary and "Baby and Me Booklet".  After visit meds:  Allergies as of 04/12/2017      Reactions   Amoxicillin Rash   Has patient had a PCN reaction causing immediate rash, facial/tongue/throat swelling, SOB or lightheadedness with hypotension: Yes Has patient had a PCN reaction causing severe rash involving mucus membranes or skin necrosis: No Has patient had a PCN reaction that required hospitalization: No Has patient had a PCN reaction occurring within the last 10 years: No If all of the above answers are "NO", then may proceed with Cephalosporin use.   Ceclor [cefaclor] Rash   Red Dye Rash   Ultram [tramadol] Itching      Medication List    STOP taking these medications   acetaminophen 325 MG tablet Commonly known as:  TYLENOL   calcium carbonate 500 MG chewable tablet Commonly known as:  TUMS - dosed in mg elemental calcium   cetirizine 10 MG tablet Commonly known as:  ZYRTEC   ondansetron 4 MG disintegrating tablet Commonly known as:  ZOFRAN-ODT     TAKE these medications   CITRANATAL ASSURE 35-1 & 300 MG tablet One  tablet and one capsule daily   ibuprofen 600 MG tablet Commonly known as:  ADVIL,MOTRIN Take 1 tablet (600 mg total) by mouth every 6 (six) hours as needed.   oxyCODONE 5 MG immediate release tablet Commonly known as:  Oxy IR/ROXICODONE Take 1 tablet (5 mg total) by mouth every 4 (four) hours as needed for moderate pain (4-7 pain as needed).       Diet: routine diet  Activity: Advance as tolerated. Pelvic rest for 6 weeks.   Outpatient follow up:1 wk for BP check, then 4 wk for pp visit Follow up Appt:No future appointments. Follow up Visit:No Follow-up on file.  Postpartum contraception: Undecided  Newborn Data: Live born female  Birth Weight: 7 lb 13.8 oz (3565 g) APGAR: 8, 8  Baby Feeding: Breast Disposition:home with mother   04/12/2017 Cam HaiSHAW, Marijayne Rauth, CNM 7:54 AM

## 2017-04-12 NOTE — Progress Notes (Signed)
Post Partum Day 2 Subjective: no complaints, up ad lib, voiding, tolerating PO and + flatus. Patient still does not know what birth control method she would like, but does not wish to have the nexplanon.  Objective: Blood pressure 140/68, pulse 72, temperature 98 F (36.7 C), temperature source Oral, resp. rate 18, height 5\' 9"  (1.753 m), weight 125.2 kg (276 lb), last menstrual period 06/14/2016, SpO2 100 %, unknown if currently breastfeeding.  Physical Exam:  General: alert, cooperative and no distress Lochia: appropriate Uterine Fundus: firm Incision: healing well, no significant drainage DVT Evaluation: No evidence of DVT seen on physical exam. Negative Homan's sign. No significant calf/ankle edema.   Recent Labs  04/10/17 0259 04/11/17 0523  HGB 11.5* 10.4*  HCT 33.5* 31.2*    Assessment/Plan: Breastfeeding and Contraception Undecided   LOS: 3 days   [place] Willis 04/12/2017, 6:25 AM   I have seen and examined this patient and I agree with the above. Pt is now desirous of early d/c- see my Discharge Summary. Mallory Willis 8:00 AM 04/12/2017

## 2017-04-12 NOTE — Discharge Instructions (Signed)

## 2017-04-14 ENCOUNTER — Telehealth: Payer: Self-pay | Admitting: Obstetrics and Gynecology

## 2017-04-14 NOTE — Telephone Encounter (Signed)
Patient called stating that she would like a call back from Dr. Emelda FearFerguson regarding her incision. Pt has a baby not to long ago.Please contact pt

## 2017-04-14 NOTE — Telephone Encounter (Signed)
Patient called with questions regarding removing c/s dressing. Advised patient she could remove it while taking a shower, to let soapy water go down incision and to pat dry. Informed she may need to use a pad as a barrier between clothes and incision. Verbalized understanding.

## 2017-04-16 ENCOUNTER — Telehealth: Payer: Self-pay | Admitting: Obstetrics and Gynecology

## 2017-04-19 ENCOUNTER — Encounter: Payer: Medicaid Other | Admitting: Obstetrics and Gynecology

## 2017-04-19 ENCOUNTER — Telehealth: Payer: Self-pay | Admitting: Obstetrics and Gynecology

## 2017-04-19 NOTE — Telephone Encounter (Signed)
Spoke with pt. Pt is requesting more pain meds. She had a c section 7/28. Pt had appt today but cancelled and rescheduled. I advised she needs to schedule appt for post op and discuss pain meds. Advised to take Motrin 600 mg and Tylenol if need be. Pt voiced understanding. JSY

## 2017-04-19 NOTE — Telephone Encounter (Signed)
Patient called stating that she called last week for pain medication and she was only given a 7 day and she would like to know if she could get another prescription. Please contact pt

## 2017-04-21 ENCOUNTER — Encounter: Payer: Self-pay | Admitting: Obstetrics and Gynecology

## 2017-04-21 ENCOUNTER — Ambulatory Visit (INDEPENDENT_AMBULATORY_CARE_PROVIDER_SITE_OTHER): Payer: Medicaid Other | Admitting: Obstetrics and Gynecology

## 2017-04-21 VITALS — BP 120/78 | HR 66 | Ht 69.0 in | Wt 258.8 lb

## 2017-04-21 DIAGNOSIS — Z9889 Other specified postprocedural states: Secondary | ICD-10-CM | POA: Diagnosis not present

## 2017-04-21 DIAGNOSIS — A549 Gonococcal infection, unspecified: Secondary | ICD-10-CM | POA: Diagnosis not present

## 2017-04-21 MED ORDER — TRAMADOL HCL 50 MG PO TABS: 50.0000 mg | ORAL_TABLET | Freq: Four times a day (QID) | ORAL | 0 refills | Status: DC | PRN

## 2017-04-21 NOTE — Progress Notes (Signed)
° °  Subjective:  Mallory Willis is a 21 y.o. female now 11 days status post C-section.  Pt is unsure about which BC she wants to use.   Review of Systems Negative except post surgery pain   Diet:   normal   Bowel movements : normal.  Pain is controlled without any medications. Pt asking for pain medication  Objective:  LMP 06/14/2016 (Approximate)  General:Well developed, well nourished.  No acute distress. Abdomen: Bowel sounds normal, soft, non-tender. Pelvic Exam: not indicated  Incision(s):   Healing well, no drainage, no erythema, no hernia, no swelling, no dehiscence,     Assessment:  Post-Op 11 days s/p C-section    Doing well postoperativelyExcept it continued pain med needs   Plan:  1.Wound care discussed   2. Current medicationsWill: Rx Tramadol for pain 3. Activity restrictions: Follow post op instructions 4. return to work: not applicable. 5. Follow up in 4 weeks.    By signing my name below, I, Mallory Willis, attest that this documentation has been prepared under the direction and in the presence of Tilda BurrowFerguson, John V, MD. Electronically Signed: Redge GainerIzna Willis, Medical Scribe. 04/21/17. 11:38 AM.  I personally performed the services described in this documentation, which was SCRIBED in my presence. The recorded information has been reviewed and considered accurate. It has been edited as necessary during review. Tilda BurrowFERGUSON,JOHN V, MD

## 2017-04-23 LAB — GC/CHLAMYDIA PROBE AMP
Chlamydia trachomatis, NAA: NEGATIVE
Neisseria gonorrhoeae by PCR: NEGATIVE

## 2017-04-26 ENCOUNTER — Encounter: Payer: Medicaid Other | Admitting: Obstetrics and Gynecology

## 2017-05-11 ENCOUNTER — Ambulatory Visit (INDEPENDENT_AMBULATORY_CARE_PROVIDER_SITE_OTHER): Payer: Medicaid Other | Admitting: Advanced Practice Midwife

## 2017-05-11 MED ORDER — NORETHIN-ETH ESTRAD-FE BIPHAS 1 MG-10 MCG / 10 MCG PO TABS: 1.0000 | ORAL_TABLET | Freq: Every day | ORAL | 11 refills | Status: DC

## 2017-05-11 NOTE — Progress Notes (Signed)
Mallory Willis is a 21 y.o. who presents for a postpartum visit. She is 4 weeks postpartum following a low cervical transverse Cesarean section. I have fully reviewed the prenatal and intrapartum course. The delivery was at 40.3 gestational weeks.PLTCS for failed IOL for postdates (Arrest of labor at 6 cm 80% -2 vertex presentation with molded vertex and failure to progress over 3-1/2 hours of optimal labor by IUPC) Anesthesia: spinal. Postpartum course has been uneventful. Baby's course has been uneventful. Baby is feeding by bottle. Bleeding: no bleeding. Bowel function is normal. Bladder function is normal. Patient is not sexually active. Contraception method is none. Postpartum depression screening: negative.   Current Outpatient Prescriptions:  .  ibuprofen (ADVIL,MOTRIN) 600 MG tablet, Take 1 tablet (600 mg total) by mouth every 6 (six) hours as needed., Disp: 30 tablet, Rfl: 0 .  Prenat w/o A-FeCbGl-DSS-FA-DHA (CITRANATAL ASSURE) 35-1 & 300 MG tablet, One tablet and one capsule daily (Patient taking differently: Takes 3 days a week), Disp: 60 tablet, Rfl: 11 .  Norethindrone-Ethinyl Estradiol-Fe Biphas (LO LOESTRIN FE) 1 MG-10 MCG / 10 MCG tablet, Take 1 tablet by mouth daily. Start the Sunday after the baby is 48 weeks old., Disp: 1 Package, Rfl: 11 .  traMADol (ULTRAM) 50 MG tablet, Take 1 tablet (50 mg total) by mouth every 6 (six) hours as needed for moderate pain or severe pain. (Patient not taking: Reported on 05/11/2017), Disp: 20 tablet, Rfl: 0  Review of Systems   Constitutional: Negative for fever and chills Eyes: Negative for visual disturbances Respiratory: Negative for shortness of breath, dyspnea Cardiovascular: Negative for chest pain or palpitations  Gastrointestinal: Negative for vomiting, diarrhea and constipation Genitourinary: Negative for dysuria and urgency Musculoskeletal: Negative for back pain, joint pain, myalgias  Neurological: Negative for dizziness and headaches     Objective:     Vitals:   05/11/17 1109  BP: 136/88  Pulse: 85   General:  alert, cooperative and no distress   Breasts:  negative  Lungs: clear to auscultation bilaterally  Heart:  regular rate and rhythm  Abdomen: Soft, nontenderincision well healed   Vulva:  normal  Vagina: normal vagina  Cervix:  closed  Corpus: Well involuted     Rectal Exam: no hemorrhoids        Assessment:    normal postpartum exam.  Plan:   1. Contraception: cocs 2. Follow up in:   or as needed.

## 2017-08-11 ENCOUNTER — Ambulatory Visit: Payer: Medicaid Other | Admitting: Advanced Practice Midwife

## 2017-08-18 ENCOUNTER — Ambulatory Visit: Payer: Medicaid Other | Admitting: Advanced Practice Midwife

## 2017-08-19 ENCOUNTER — Ambulatory Visit: Payer: Medicaid Other | Admitting: Advanced Practice Midwife

## 2017-08-25 ENCOUNTER — Ambulatory Visit: Payer: Medicaid Other | Admitting: Adult Health

## 2017-09-01 ENCOUNTER — Ambulatory Visit: Payer: Medicaid Other | Admitting: Adult Health

## 2017-09-13 ENCOUNTER — Ambulatory Visit: Payer: Medicaid Other | Admitting: Adult Health

## 2017-09-22 ENCOUNTER — Encounter: Payer: Self-pay | Admitting: Adult Health

## 2017-09-22 ENCOUNTER — Ambulatory Visit (INDEPENDENT_AMBULATORY_CARE_PROVIDER_SITE_OTHER): Payer: Medicaid Other | Admitting: Adult Health

## 2017-09-22 ENCOUNTER — Other Ambulatory Visit: Payer: Self-pay

## 2017-09-22 VITALS — BP 132/82 | HR 97 | Ht 69.0 in | Wt 272.0 lb

## 2017-09-22 DIAGNOSIS — Z30011 Encounter for initial prescription of contraceptive pills: Secondary | ICD-10-CM

## 2017-09-22 MED ORDER — NORETHIN-ETH ESTRAD-FE BIPHAS 1 MG-10 MCG / 10 MCG PO TABS: ORAL_TABLET | ORAL | 12 refills | Status: DC

## 2017-09-22 NOTE — Progress Notes (Signed)
Subjective:     Patient ID: Mallory Willis, female   DOB: 1995/12/18, 22 y.o.   MRN: [id number]  HPI Mallory Willis is a 22 year old black female in to discuss birth control, she has not been on any, since baby was born in July 2018.   Review of Systems Patient denies any headaches, hearing loss, fatigue, blurred vision, shortness of breath, chest pain, abdominal pain, problems with bowel movements, urination, or intercourse. No joint pain or mood swings. Reviewed past medical,surgical, social and family history. Reviewed medications and allergies.     Objective:   Physical Exam BP 132/82 (BP Location: Left Arm, Patient Position: Sitting, Cuff Size: Large)   Pulse 97   Ht 5\' 9"  (1.753 m)   Wt 272 lb (123.4 kg)   LMP 09/08/2017 (Approximate)   Breastfeeding? No   BMI 40.17 kg/m  Skin warm and dry. Neck: mid line trachea, normal thyroid, good ROM, no lymphadenopathy noted. Lungs: clear to ausculation bilaterally. Cardiovascular: regular rate and rhythm.    Assessment:     1. Encounter for initial prescription of contraceptive pills       Plan:     Meds ordered this encounter  Medications  . Norethindrone-Ethinyl Estradiol-Fe Biphas (LO LOESTRIN FE) 1 MG-10 MCG / 10 MCG tablet    Sig: Take 1 daily    Dispense:  1 Package    Refill:  12    Order Specific Question:   Supervising Provider    Answer:   Lazaro ArmsEURE, LUTHER H [[id number]]     Start with next period and use condoms F/U in 3 months and pap in November

## 2017-12-21 ENCOUNTER — Ambulatory Visit: Payer: Medicaid Other | Admitting: Adult Health

## 2018-01-06 ENCOUNTER — Encounter: Payer: Self-pay | Admitting: Adult Health

## 2018-01-06 ENCOUNTER — Ambulatory Visit (INDEPENDENT_AMBULATORY_CARE_PROVIDER_SITE_OTHER): Payer: Medicaid Other | Admitting: Adult Health

## 2018-01-06 VITALS — BP 118/80 | HR 94 | Ht 69.0 in | Wt 273.0 lb

## 2018-01-06 DIAGNOSIS — N390 Urinary tract infection, site not specified: Secondary | ICD-10-CM | POA: Insufficient documentation

## 2018-01-06 DIAGNOSIS — Z113 Encounter for screening for infections with a predominantly sexual mode of transmission: Secondary | ICD-10-CM | POA: Insufficient documentation

## 2018-01-06 DIAGNOSIS — R10A1 Flank pain, right side: Secondary | ICD-10-CM | POA: Insufficient documentation

## 2018-01-06 DIAGNOSIS — R109 Unspecified abdominal pain: Secondary | ICD-10-CM | POA: Insufficient documentation

## 2018-01-06 DIAGNOSIS — R319 Hematuria, unspecified: Secondary | ICD-10-CM

## 2018-01-06 DIAGNOSIS — R300 Dysuria: Secondary | ICD-10-CM | POA: Insufficient documentation

## 2018-01-06 LAB — POCT URINALYSIS DIPSTICK
GLUCOSE UA: NEGATIVE
Nitrite, UA: NEGATIVE

## 2018-01-06 MED ORDER — KETOROLAC TROMETHAMINE 10 MG PO TABS: 10.0000 mg | ORAL_TABLET | Freq: Four times a day (QID) | ORAL | 0 refills | Status: DC | PRN

## 2018-01-06 MED ORDER — PROMETHAZINE HCL 25 MG PO TABS: 25.0000 mg | ORAL_TABLET | Freq: Four times a day (QID) | ORAL | 1 refills | Status: DC | PRN

## 2018-01-06 MED ORDER — SULFAMETHOXAZOLE-TRIMETHOPRIM 800-160 MG PO TABS
1.0000 | ORAL_TABLET | Freq: Two times a day (BID) | ORAL | 0 refills | Status: DC
Start: 2018-01-06 — End: 2018-03-18

## 2018-01-06 NOTE — Progress Notes (Signed)
Subjective:     Patient ID: Mallory RiggsGemini Penkala, female   DOB: 03-Apr-1996, 22 y.o.   MRN: [id number]  HPI Mallory Willis is a 22 year old black female in complaining of ? UTI has pressure at end of stream and pain right back area, has seen blood in urine and has noticed about a week but worse last night,almost as bad as having a baby.  Review of Systems +Pressure at end of urine stream  +Urinary urgency +blood in urine  +pain right lower back area Reviewed past medical,surgical, social and family history. Reviewed medications and allergies.     Objective:   Physical Exam BP 118/80 (BP Location: Right Arm, Patient Position: Sitting, Cuff Size: Large)   Pulse 94   Ht 5\' 9"  (1.753 m)   Wt 273 lb (123.8 kg)   LMP 12/14/2017   BMI 40.32 kg/m Urine large leuks,large blood, 3+ protein and small ketones. Skin warm and dry.  Lungs: clear to ausculation bilaterally. Cardiovascular: regular rate and rhythm.+tenderness over bladder and LLQ, +right flank tenderness, ?right CVAT.Pain 5/10 now. Will get renal US to r/o kidney stone, rx septra ds and phenergan,and toradol and push fluids.     Assessment:     1. Urinary tract infection with hematuria, site unspecified   2. Dysuria   3. Right flank pain   4. Screening examination for STD (sexually transmitted disease)       Plan:     Meds ordered this encounter  Medications  . sulfamethoxazole-trimethoprim (BACTRIM DS,SEPTRA DS) 800-160 MG tablet    Sig: Take 1 tablet by mouth 2 (two) times daily. Take 1 bid    Dispense:  14 tablet    Refill:  0    Order Specific Question:   Supervising Provider    Answer:   Despina HiddenEURE, LUTHER H [[id number]]  . promethazine (PHENERGAN) 25 MG tablet    Sig: Take 1 tablet (25 mg total) by mouth every 6 (six) hours as needed for nausea or vomiting.    Dispense:  30 tablet    Refill:  1    Order Specific Question:   Supervising Provider    Answer:   Despina HiddenEURE, LUTHER H [[id number]]  . ketorolac (TORADOL) 10 MG tablet    Sig: Take 1 tablet  (10 mg total) by mouth every 6 (six) hours as needed.    Dispense:  20 tablet    Refill:  0    Order Specific Question:   Supervising Provider    Answer:   Lazaro ArmsEURE, LUTHER H [[id number]]  [facility] sent GC/CHL on urine sent Review handouts on UTI and kidney stones F/U 4/29 at 11:30 am

## 2018-01-06 NOTE — Patient Instructions (Signed)
Kidney Stones Kidney stones (urolithiasis) are solid, rock-like deposits that form inside of the organs that make urine (kidneys). A kidney stone may form in a kidney and move into the bladder, where it can cause intense pain and block the flow of urine. Kidney stones are created when high levels of certain minerals are found in the urine. They are usually passed through urination, but in some cases, medical treatment may be needed to remove them. What are the causes? Kidney stones may be caused by:  A condition in which certain glands produce too much parathyroid hormone (primary hyperparathyroidism), which causes too much calcium buildup in the blood.  Buildup of uric acid crystals in the bladder (hyperuricosuria). Uric acid is a chemical that the body produces when you eat certain foods. It usually exits the body in the urine.  Narrowing (stricture) of one or both of the tubes that drain urine from the kidneys to the bladder (ureters).  A kidney blockage that is present at birth (congenital obstruction).  Past surgery on the kidney or the ureters, such as gastric bypass surgery.  What increases the risk? The following factors make you more likely to develop kidney stones:  Having had a kidney stone in the past.  Having a family history of kidney stones.  Not drinking enough water.  Eating a diet that is high in protein, salt (sodium), or sugar.  Being overweight or obese.  What are the signs or symptoms? Symptoms of a kidney stone may include:  Nausea.  Vomiting.  Blood in the urine (hematuria).  Pain in the side of the abdomen, right below the ribs (flank pain). Pain usually spreads (radiates) to the groin.  Needing to urinate frequently or urgently.  How is this diagnosed? This condition may be diagnosed based on:  Your medical history.  A physical exam.  Blood tests.  Urine tests.  CT scan.  Abdominal X-ray.  A procedure to examine the inside of the  bladder (cystoscopy).  How is this treated? Treatment for kidney stones depends on the size, location, and makeup of the stones. Treatment may involve:  Analyzing your urine before and after you pass the stone through urination.  Being monitored at the hospital until you pass the stone through urination.  Increasing your fluid intake and decreasing the amount of calcium and protein in your diet.  A procedure to break up kidney stones in the bladder using: ? A focused beam of light (laser therapy). ? Shock waves (extracorporeal shock wave lithotripsy).  Surgery to remove kidney stones. This may be needed if you have severe pain or have stones that block your urinary tract.  Follow these instructions at home: Eating and drinking   Drink enough fluid to keep your urine clear or pale yellow. This will help you to pass the kidney stone.  If directed, change your diet. This may include: ? Limiting how much sodium you eat. ? Eating more fruits and vegetables. ? Limiting how much meat, poultry, fish, and eggs you eat.  Follow instructions from your health care provider about eating or drinking restrictions. General instructions  Collect urine samples as told by your health care provider. You may need to collect a urine sample: ? 24 hours after you pass the stone. ? 8-12 weeks after passing the kidney stone, and every 6-12 months after that.  Strain your urine every time you urinate, for as long as directed. Use the strainer that your health care provider recommends.  Do not throw out   the kidney stone after passing it. Keep the stone so it can be tested by your health care provider. Testing the makeup of your kidney stone may help prevent you from getting kidney stones in the future.  Take over-the-counter and prescription medicines only as told by your health care provider.  Keep all follow-up visits as told by your health care provider. This is important. You may need follow-up  X-rays or ultrasounds to make sure that your stone has passed. How is this prevented? To prevent another kidney stone:  Drink enough fluid to keep your urine clear or pale yellow. This is the best way to prevent kidney stones.  Eat a healthy diet and follow recommendations from your health care provider about foods to avoid. You may be instructed to eat a low-protein diet. Recommendations vary depending on the type of kidney stone that you have.  Maintain a healthy weight.  Contact a health care provider if:  You have pain that gets worse or does not get better with medicine. Get help right away if:  You have a fever or chills.  You develop severe pain.  You develop new abdominal pain.  You faint.  You are unable to urinate. This information is not intended to replace advice given to you by your health care provider. Make sure you discuss any questions you have with your health care provider. Document Released: 08/31/2005 Document Revised: 03/20/2016 Document Reviewed: 02/14/2016 Elsevier Interactive Patient Education  2018 [employer] Urinary Tract Infection, Adult A urinary tract infection (UTI) is an infection of any part of the urinary tract, which includes the kidneys, ureters, bladder, and urethra. These organs make, store, and get rid of urine in the body. UTI can be a bladder infection (cystitis) or kidney infection (pyelonephritis). What are the causes? This infection may be caused by fungi, viruses, or bacteria. Bacteria are the most common cause of UTIs. This condition can also be caused by repeated incomplete emptying of the bladder during urination. What increases the risk? This condition is more likely to develop if:  You ignore your need to urinate or hold urine for long periods of time.  You do not empty your bladder completely during urination.  You wipe back to front after urinating or having a bowel movement, if you are female.  You are uncircumcised, if  you are female.  You are constipated.  You have a urinary catheter that stays in place (indwelling).  You have a weak defense (immune) system.  You have a medical condition that affects your bowels, kidneys, or bladder.  You have diabetes.  You take antibiotic medicines frequently or for long periods of time, and the antibiotics no longer work well against certain types of infections (antibiotic resistance).  You take medicines that irritate your urinary tract.  You are exposed to chemicals that irritate your urinary tract.  You are female.  What are the signs or symptoms? Symptoms of this condition include:  Fever.  Frequent urination or passing small amounts of urine frequently.  Needing to urinate urgently.  Pain or burning with urination.  Urine that smells bad or unusual.  Cloudy urine.  Pain in the lower abdomen or back.  Trouble urinating.  Blood in the urine.  Vomiting or being less hungry than normal.  Diarrhea or abdominal pain.  Vaginal discharge, if you are female.  How is this diagnosed? This condition is diagnosed with a medical history and physical exam. You will also need to provide a urine sample   to test your urine. Other tests may be done, including:  Blood tests.  Sexually transmitted disease (STD) testing.  If you have had more than one UTI, a cystoscopy or imaging studies may be done to determine the cause of the infections. How is this treated? Treatment for this condition often includes a combination of two or more of the following:  Antibiotic medicine.  Other medicines to treat less common causes of UTI.  Over-the-counter medicines to treat pain.  Drinking enough water to stay hydrated.  Follow these instructions at home:  Take over-the-counter and prescription medicines only as told by your health care provider.  If you were prescribed an antibiotic, take it as told by your health care provider. Do not stop taking the  antibiotic even if you start to feel better.  Avoid alcohol, caffeine, tea, and carbonated beverages. They can irritate your bladder.  Drink enough fluid to keep your urine clear or pale yellow.  Keep all follow-up visits as told by your health care provider. This is important.  Make sure to: ? Empty your bladder often and completely. Do not hold urine for long periods of time. ? Empty your bladder before and after sex. ? Wipe from front to back after a bowel movement if you are female. Use each tissue one time when you wipe. Contact a health care provider if:  You have back pain.  You have a fever.  You feel nauseous or vomit.  Your symptoms do not get better after 3 days.  Your symptoms go away and then return. Get help right away if:  You have severe back pain or lower abdominal pain.  You are vomiting and cannot keep down any medicines or water. This information is not intended to replace advice given to you by your health care provider. Make sure you discuss any questions you have with your health care provider. Document Released: 06/10/2005 Document Revised: 02/12/2016 Document Reviewed: 07/22/2015 Elsevier Interactive Patient Education  2018 Elsevier Inc.  

## 2018-01-07 ENCOUNTER — Ambulatory Visit (HOSPITAL_COMMUNITY): Admission: RE | Admit: 2018-01-07 | Payer: Medicaid Other | Source: Ambulatory Visit

## 2018-01-07 ENCOUNTER — Ambulatory Visit (HOSPITAL_COMMUNITY): Payer: Medicaid Other

## 2018-01-07 LAB — URINALYSIS, ROUTINE W REFLEX MICROSCOPIC
Bilirubin, UA: NEGATIVE
Glucose, UA: NEGATIVE
Ketones, UA: NEGATIVE
Nitrite, UA: NEGATIVE
Urobilinogen, Ur: 0.2 mg/dL (ref 0.2–1.0)
pH, UA: 8.5 — ABNORMAL HIGH (ref 5.0–7.5)

## 2018-01-07 LAB — MICROSCOPIC EXAMINATION: Casts: NONE SEEN /LPF

## 2018-01-08 LAB — GC/CHLAMYDIA PROBE AMP
Chlamydia trachomatis, NAA: NEGATIVE
Neisseria gonorrhoeae by PCR: NEGATIVE

## 2018-01-09 LAB — URINE CULTURE

## 2018-01-10 ENCOUNTER — Ambulatory Visit (HOSPITAL_COMMUNITY): Admission: RE | Admit: 2018-01-10 | Payer: Medicaid Other | Source: Ambulatory Visit

## 2018-01-10 ENCOUNTER — Ambulatory Visit: Payer: Medicaid Other | Admitting: Adult Health

## 2018-01-12 ENCOUNTER — Ambulatory Visit (INDEPENDENT_AMBULATORY_CARE_PROVIDER_SITE_OTHER): Payer: Medicaid Other | Admitting: Adult Health

## 2018-01-12 ENCOUNTER — Encounter: Payer: Self-pay | Admitting: Adult Health

## 2018-01-12 ENCOUNTER — Ambulatory Visit (HOSPITAL_COMMUNITY)
Admission: RE | Admit: 2018-01-12 | Discharge: 2018-01-12 | Disposition: A | Discharge status: Home / Self Care | Payer: Medicaid Other | Source: Ambulatory Visit | Attending: Adult Health | Admitting: Adult Health

## 2018-01-12 VITALS — BP 136/80 | HR 77 | Ht 69.0 in | Wt 276.0 lb

## 2018-01-12 DIAGNOSIS — R319 Hematuria, unspecified: Secondary | ICD-10-CM

## 2018-01-12 DIAGNOSIS — R109 Unspecified abdominal pain: Secondary | ICD-10-CM | POA: Insufficient documentation

## 2018-01-12 DIAGNOSIS — R300 Dysuria: Secondary | ICD-10-CM | POA: Diagnosis not present

## 2018-01-12 DIAGNOSIS — N390 Urinary tract infection, site not specified: Secondary | ICD-10-CM

## 2018-01-12 NOTE — Progress Notes (Signed)
  Subjective:     Patient ID: Mallory Willis, female   DOB: Aug 01, 1996, 22 y.o.   MRN: [id number]  HPI Mallory Willis is a 22 year old black female in for F/U on UTI and possible kidney stone.Urine culture + E coli, did not get renal US til today.She is feeling better.   Review of Systems Still burns at end of stream some [place]ells much better Reviewed past medical,surgical, social and family history. Reviewed medications and allergies.     Objective:   Physical Exam BP 136/80 (BP Location: Left Arm, Patient Position: Sitting, Cuff Size: Normal)   Pulse 77   Ht  (1.753 m)   Wt 276 lb (125.2 kg)   LMP 12/14/2017   BMI 40.76 kg/m  Skin warm and dry.No CVAT.    Assessment:     UTI    Plan:     Finish septra ds Await Renal US results,will talk when back Push fluids  F/U prn

## 2018-03-18 ENCOUNTER — Encounter (HOSPITAL_COMMUNITY): Payer: Self-pay | Admitting: Emergency Medicine

## 2018-03-18 ENCOUNTER — Emergency Department (HOSPITAL_COMMUNITY)
Admission: EM | Admit: 2018-03-18 | Discharge: 2018-03-18 | Disposition: A | Discharge status: Home / Self Care | Payer: Medicaid Other | Attending: Emergency Medicine | Admitting: Emergency Medicine

## 2018-03-18 ENCOUNTER — Emergency Department (HOSPITAL_COMMUNITY): Payer: Medicaid Other

## 2018-03-18 DIAGNOSIS — M79645 Pain in left finger(s): Secondary | ICD-10-CM

## 2018-03-18 MED ORDER — DICLOFENAC SODIUM 1 % TD GEL: 2.0000 g | Freq: Four times a day (QID) | TRANSDERMAL | 0 refills | Status: DC

## 2018-03-18 MED ORDER — PREDNISONE 10 MG (21) PO TBPK: ORAL_TABLET | ORAL | 0 refills | Status: DC

## 2018-03-18 MED ORDER — HYDROCODONE-ACETAMINOPHEN 5-325 MG PO TABS
1.0000 | ORAL_TABLET | Freq: Once | ORAL | Status: AC
  Administered 2018-03-18: 1 via ORAL
  Filled 2018-03-18: qty 1

## 2018-03-18 MED ORDER — HYDROCODONE-ACETAMINOPHEN 5-325 MG PO TABS: 1.0000 | ORAL_TABLET | Freq: Four times a day (QID) | ORAL | 0 refills | Status: DC | PRN

## 2018-03-18 NOTE — ED Triage Notes (Signed)
Patient here from home with complaints of left thumb pain. Denies trauma. 2 weeks swelling.

## 2018-03-18 NOTE — Discharge Instructions (Addendum)
You have been seen today for thumb pain. There were no acute abnormalities on the x-rays, including no sign of fracture or dislocation, however, there could be abnormalities to the soft tissues, such as the ligaments or tendons that are not seen on xrays. There could also be what are called occult fractures that are small fractures not seen on xray. Pain: Take 600 mg of ibuprofen every 6 hours or 440 mg (over the counter dose) to 500 mg (prescription dose) of naproxen every 12 hours for the next 3 days. After this time, these medications may be used as needed for pain. Take these medications with food to avoid upset stomach. Choose only one of these medications, do not take them together.  Tylenol: Should you continue to have additional pain while taking the ibuprofen or naproxen, you may add in tylenol as needed. Your daily total maximum amount of tylenol from all sources should be limited to 4000mg /day for persons without liver problems, or 2000mg /day for those with liver problems. Vicodin: May take Vicodin as needed for severe pain.  Do not drive or perform other dangerous activities while taking the Vicodin.  Please note that each pill of Vicodin contains 325 mg of Tylenol and the above dosage limits apply. Diclofenac gel: As an alternative to taking medications such as ibuprofen or naproxen, may apply the diclofenac gel to the region. Prednisone: Take this medication, as prescribed, until resolved to reduce inflammation and swelling. Ice: May apply ice to the area over the next 24 hours for 15 minutes at a time to reduce swelling. Elevation: Keep the extremity elevated as often as possible to reduce pain and inflammation. Support: Wear the splint for support and comfort. Wear this until pain resolves.  Follow up: Follow-up with the hand specialist, ideally within the next week. Return: Return to the ED for numbness, weakness, increasing pain, overall worsening symptoms, loss of function, or if  symptoms are not improving, you have tried to follow up with the hand specialist, and have been unable to do so.  Your blood pressure was higher than normal today.  Please follow-up with a primary care provider on this matter.

## 2018-03-18 NOTE — ED Provider Notes (Signed)
[facility]-EMERGENCY DEPT Provider Note   CSN: [id number] Arrival date & time: 03/18/18  1115     History   Chief Complaint Chief Complaint  Patient presents with  . Hand Pain    HPI Mallory Willis is a 22 y.o. female.  HPI   Mallory Willis is a 22 y.o. female, patient with no pertinent past medical history, presenting to the ED with left thumb pain and swelling for the past 2 weeks.  Pain is throbbing, moderate to severe, nonradiating.  She has tried ibuprofen without improvement.  Denies numbness, weakness, injury, progressive symptoms, or any other complaints.       Past Medical History:  Diagnosis Date  . Asthma   . Chronic back pain   . Gonorrhea   . Nausea and vomiting during pregnancy 08/14/2016  . Supervision of normal pregnancy 08/14/2016    [facility] at   7+2 weeks FOB  malick mcfarland 21 yo bm 2nd Dating By  LMP and US  Pap   NA GC/CT Initial:                36+wks: Genetic Screen NT/IT:  CF screen   Declined  Anatomic US  Flu vaccine  Tdap Recommended ~ 28wks Glucose Screen  2 hr GBS  Feed Preference  Contraception  Circumcision  Childbirth Classes  Pediatrician      Patient Active Problem List   Diagnosis Date Noted  . Right flank pain 01/06/2018  . Screening examination for STD (sexually transmitted disease) 01/06/2018  . Dysuria 01/06/2018  . Urinary tract infection with hematuria 01/06/2018  . Post term pregnancy at [redacted] weeks gestation 04/09/2017  . Gestational hypertension 04/09/2017  . Gonorrhea affecting pregnancy in third trimester 03/11/2017  . Marijuana use 09/21/2016  . Supervision of normal pregnancy 08/14/2016  . Nausea and vomiting during pregnancy 08/14/2016    Past Surgical History:  Procedure Laterality Date  . CESAREAN SECTION N/A 04/10/2017   Procedure: CESAREAN SECTION;  Surgeon: Tilda BurrowFerguson, John V, MD;  [facility];  Service: Obstetrics;  Laterality: N/A;     OB History    Gravida  1   Para  1   Term  1   Preterm      AB      Living  1     SAB      TAB      Ectopic      Multiple  0   Live Births  1            Home Medications    Prior to Admission medications   Medication Sig Start Date End Date Taking? Authorizing Provider  cetirizine (ZYRTEC) 10 MG tablet Take 10 mg by mouth daily.   Yes [provider]  diclofenac sodium (VOLTAREN) 1 % GEL Apply 2 g topically 4 (four) times daily. 03/18/18   Joy, Shawn C, PA-C  ibuprofen (ADVIL,MOTRIN) 600 MG tablet Take 1 tablet (600 mg total) by mouth every 6 (six) hours as needed. Patient not taking: Reported on 01/06/2018 04/12/17   Arabella MerlesShaw, Kimberly D, CNM  predniSONE (STERAPRED UNI-PAK 21 TAB) 10 MG (21) TBPK tablet Take 6 tabs (60mg ) on day 1, 5 tabs (50mg ) on day 2, 4 tabs (40mg ) on day 3, 3 tabs (30mg ) on day 4, 2 tabs (20mg ) on day 5, and 1 tab (10mg ) on day 6. 03/18/18   Anselm PancoastJoy, Shawn C, PA-C    Family History Family History  Problem Relation  Age of Onset  . Other Mother        degenerative disc  . Other Father        heart surgery    Social History Social History   Tobacco Use  . Smoking status: Never Smoker  . Smokeless tobacco: Never Used  Substance Use Topics  . Alcohol use: No  . Drug use: No    Types: Marijuana    Comment: last used 08/2017     Allergies   Amoxicillin; Ceclor [cefaclor]; Red dye; and Ultram [tramadol]   Review of Systems Review of Systems  Musculoskeletal: Positive for arthralgias and joint swelling.  Neurological: Negative for weakness and numbness.     Physical Exam Updated Vital Signs BP (!) 149/118 (BP Location: Right Arm)   Pulse 71   Temp 98.7 F (37.1 C) (Oral)   Resp 19   LMP 03/14/2018 (Approximate)   SpO2 100%   Physical Exam  Constitutional: She appears well-developed and well-nourished. No distress.  HENT:  Head: Normocephalic and atraumatic.  Eyes: Conjunctivae are normal.  Neck: Neck supple.  Cardiovascular: Normal  rate, regular rhythm and intact distal pulses.  Pulmonary/Chest: Effort normal.  Musculoskeletal: She exhibits edema and tenderness.  Tenderness and minor swelling to the left thumb, on the dorsal and volar sides.  Flexion and extension intact at the IP and MCP joints.  No bruising, deformity, instability, erythema, increased warmth, or other abnormality noted.  Neurological: She is alert.  Sensation to light touch grossly intact in the left thumb and left hand. Flexion and extension against resistance intact.  Skin: Skin is warm and dry. Capillary refill takes less than 2 seconds. She is not diaphoretic. No pallor.  Psychiatric: She has a normal mood and affect. Her behavior is normal.  Nursing note and vitals reviewed.    ED Treatments / Results  Labs (all labs ordered are listed, but only abnormal results are displayed) Labs Reviewed - No data to display  EKG None  Radiology Dg Finger Thumb Left  Result Date: 03/18/2018 CLINICAL DATA:  Distal left thumb pain.  No known injury. EXAM: LEFT THUMB 2+V COMPARISON:  None. FINDINGS: There is no evidence of fracture or dislocation. There is no evidence of arthropathy or other focal bone abnormality. Soft tissues are unremarkable IMPRESSION: Negative. Electronically Signed   By: Charlett Nose M.D.   On: 03/18/2018 12:54    Procedures Procedures (including critical care time)  Medications Ordered in ED Medications - No data to display   Initial Impression / Assessment and Plan / ED Course  I have reviewed the triage vital signs and the nursing notes.  Pertinent labs & imaging results that were available during my care of the patient were reviewed by me and considered in my medical decision making (see chart for details).     Patient presents with persistent left thumb pain.  Low suspicion for infection.  No acute abnormality on x-ray.  She will follow-up with the hand specialist. The patient was given instructions for home care as  well as return precautions. Patient voices understanding of these instructions, accepts the plan, and is comfortable with discharge.    Final Clinical Impressions(s) / ED Diagnoses   Final diagnoses:  Pain of left thumb    ED Discharge Orders        Ordered    diclofenac sodium (VOLTAREN) 1 % GEL  4 times daily     03/18/18 1455    predniSONE (STERAPRED UNI-PAK 21 TAB) 10 MG (  21) TBPK tablet     03/18/18 1455       Anselm Pancoast, PA-C 03/18/18 1502    Linwood Dibbles, MD 03/19/18 1454

## 2018-03-18 NOTE — ED Notes (Signed)
Patient's Aunt at the patient's bedside. Patient's Aunt states she will be driving the patient home.

## 2018-03-18 NOTE — ED Notes (Addendum)
Patient's Aunt and patient requesting to speak with PA. Patient is requesting Vicodin prescription. Patient's aunt Myriam JacobsonHelen, a patient of [facility] earlier today, is crying, stating she needs to speak with a doctor for pain control. This RN explained the patient's aunt needed to check back into the ER to be seen by a doctor. This RN explained to the patient that she would need another ride home if her aunt received pain medication. EDPA Shawn notified.

## 2018-05-19 IMAGING — US US RENAL
1 series · 14 of 25 positions shown · non-contrast
Comparison: No prior.

CLINICAL DATA: Dysuria.

EXAM:
RENAL / URINARY TRACT ULTRASOUND COMPLETE

[Series 1: us renal · 0.22mm/px · 14 of 44 slices shown]
[im 1/44]
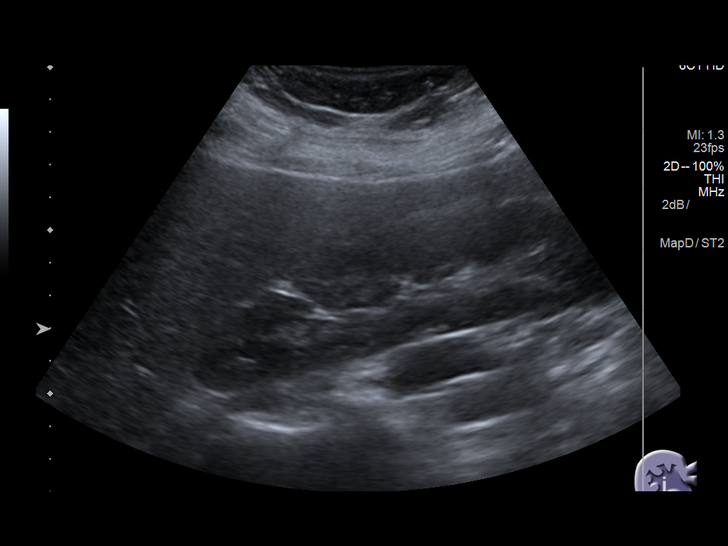
[im 4/44]
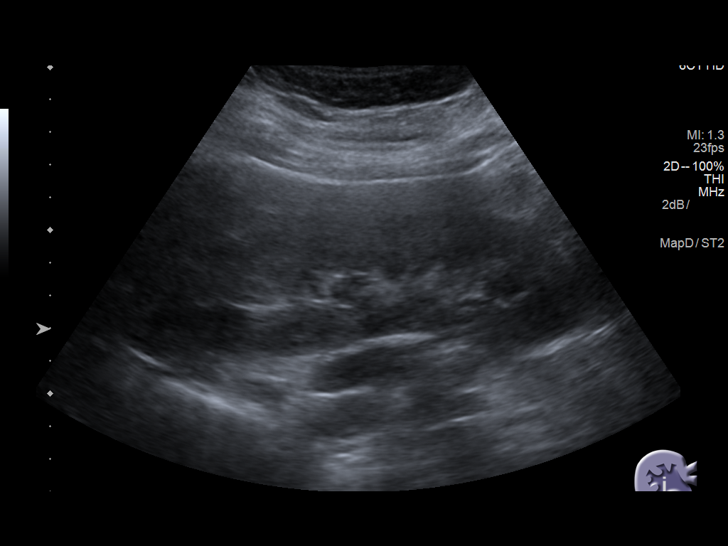
[im 8/44]
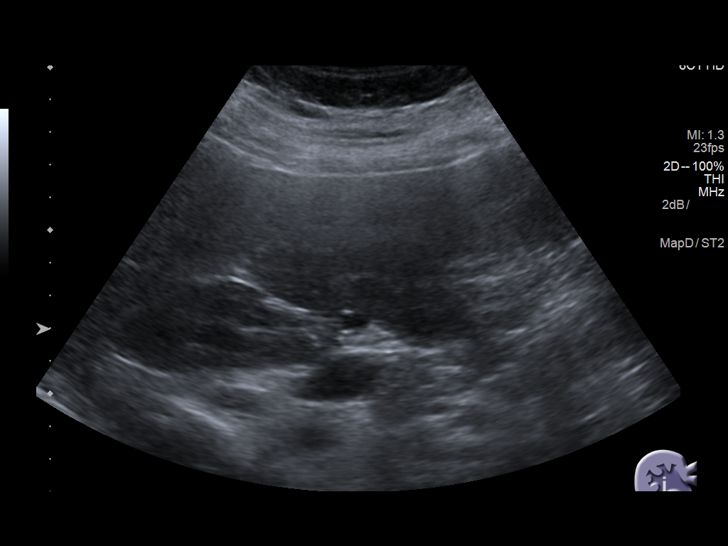
[im 11/44]
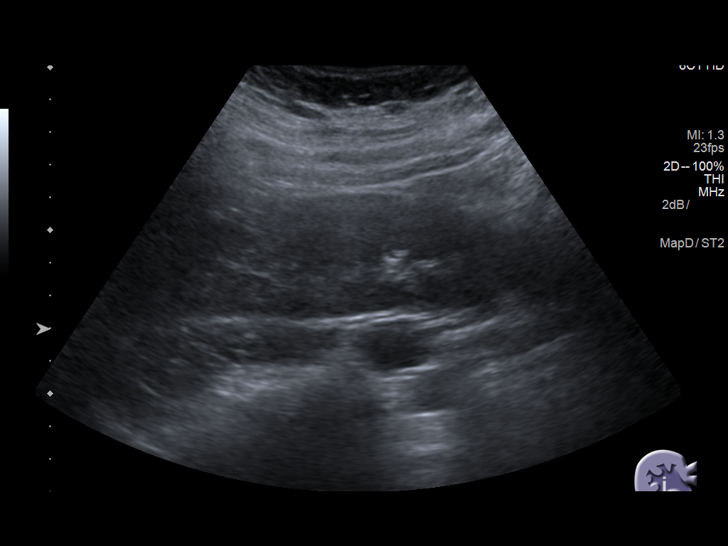
[im 15/44]
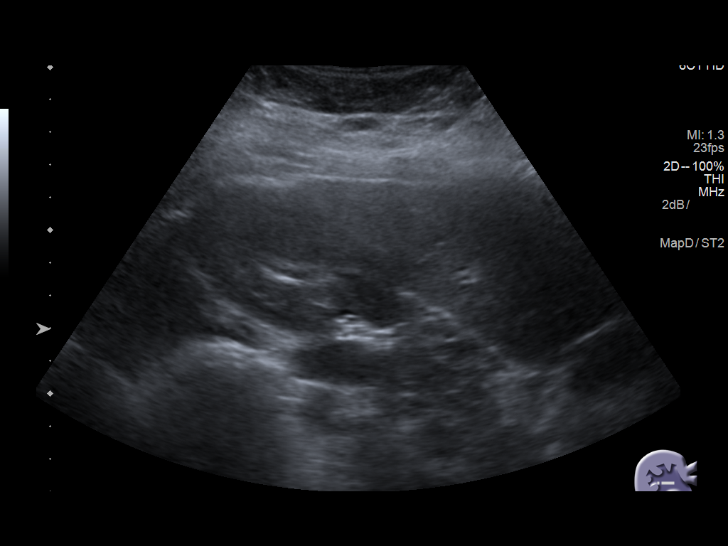
[im 17/44]
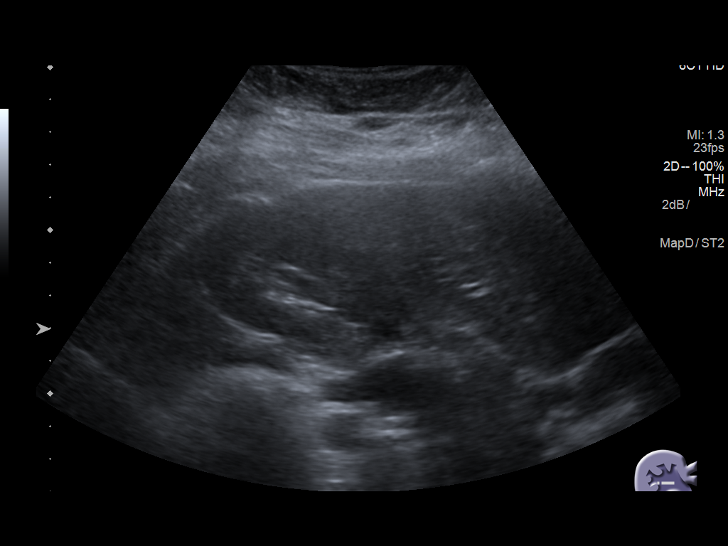
[im 20/44]
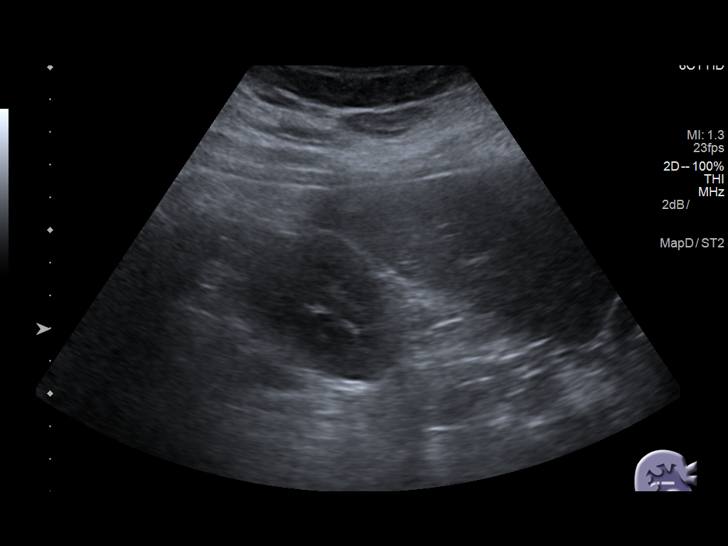
[im 24/44]
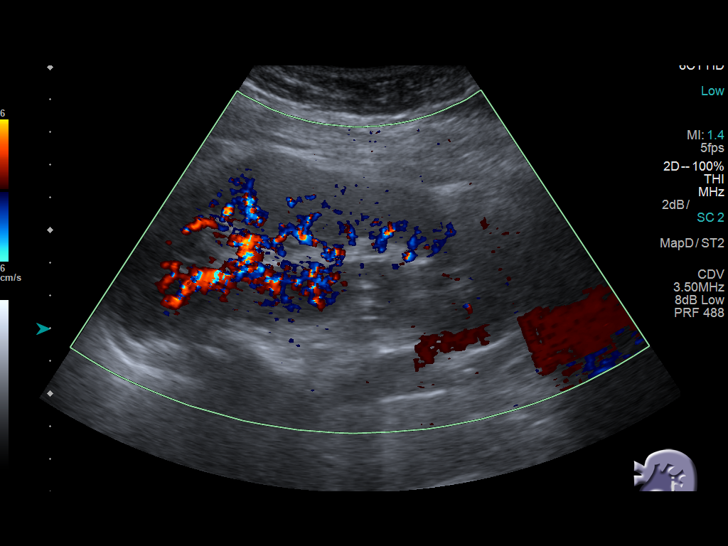
[im 27/44]
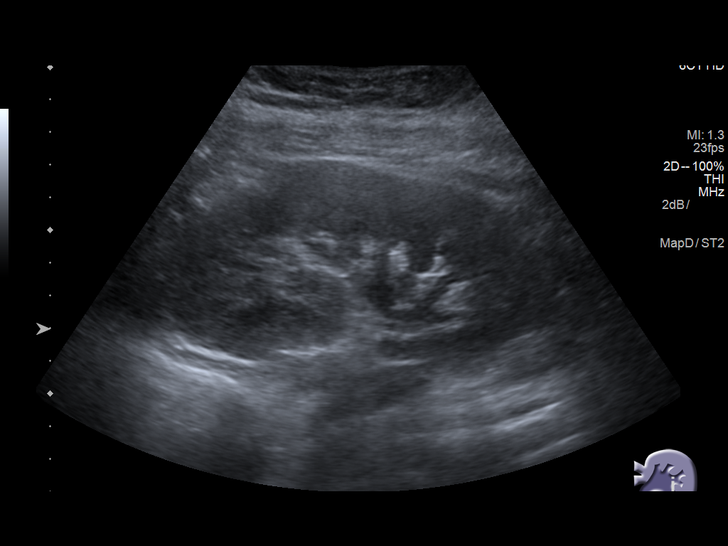
[im 29/44]
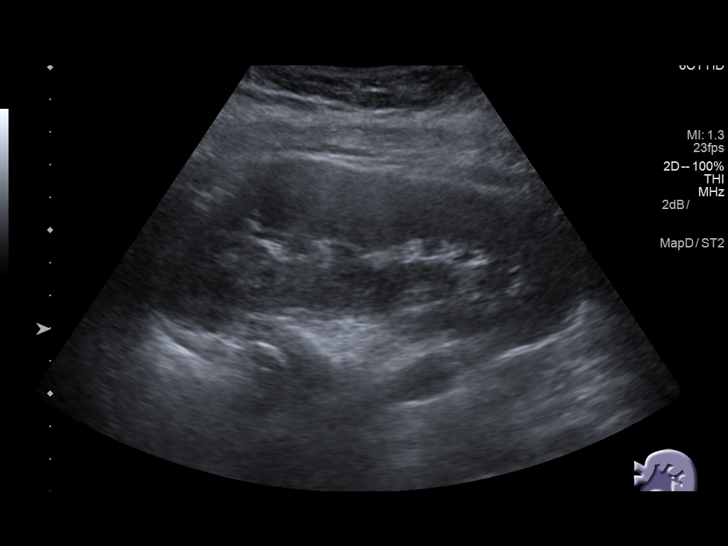
[im 33/44]
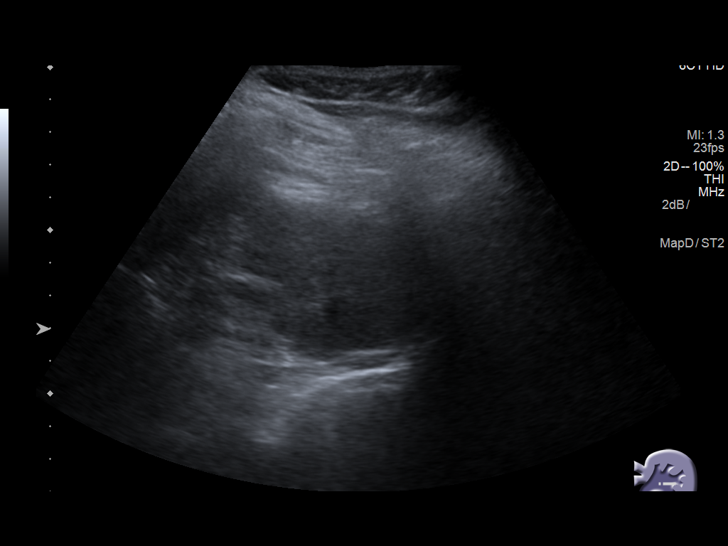
[im 36/44]
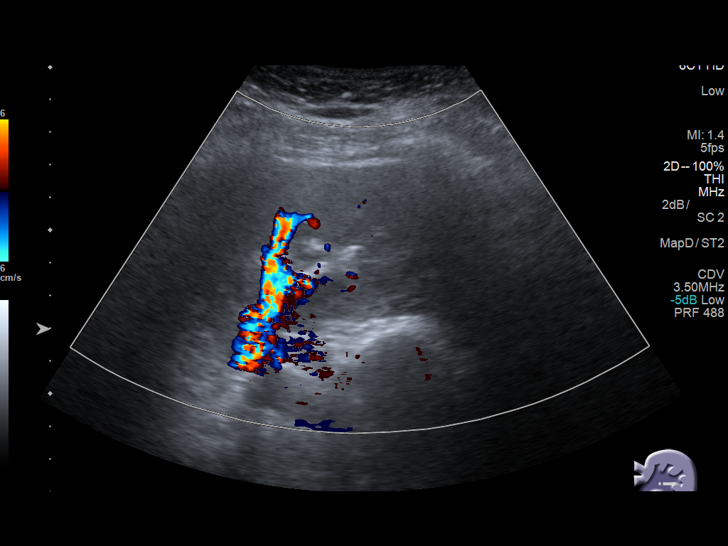
[im 40/44]
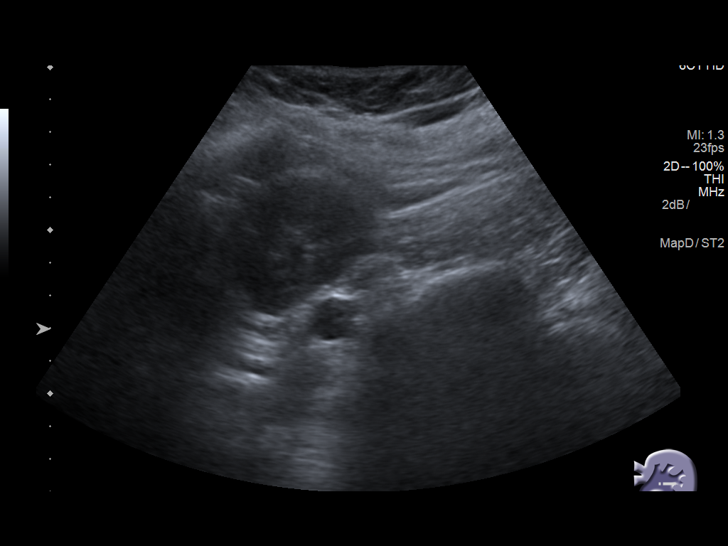
[im 44/44]
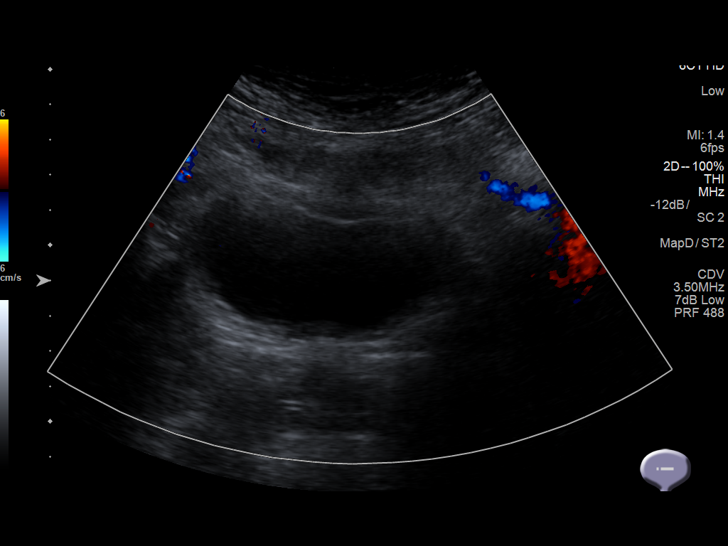

[14 of 25 positions shown; findings below may reference images not displayed]

FINDINGS: Right Kidney:

Length: 14.2 cm. Echogenicity within normal limits. No mass. Minimal
caliectasis cannot be excluded.

Left Kidney:

Length: 13.9 cm. Echogenicity within normal limits. No mass. Minimal
caliectasis cannot be excluded

Bladder:

Appears normal for degree of bladder distention. Ureteral jets not
visualized. Exam limited by patient's body habitus.
IMPRESSION: 1. Minimal bilateral renal caliectasis cannot be completely
excluded. Bladder is nondistended. Ureteral jets not visualized.
Exam limited by patient's body habitus.

2.  No renal mass.

## 2019-02-06 ENCOUNTER — Other Ambulatory Visit: Payer: Self-pay

## 2019-02-06 ENCOUNTER — Emergency Department (HOSPITAL_COMMUNITY): Payer: Self-pay

## 2019-02-06 ENCOUNTER — Emergency Department (HOSPITAL_COMMUNITY)
Admission: EM | Admit: 2019-02-06 | Discharge: 2019-02-06 | Disposition: A | Discharge status: Home / Self Care | Payer: Self-pay | Attending: Emergency Medicine | Admitting: Emergency Medicine

## 2019-02-06 DIAGNOSIS — Z79899 Other long term (current) drug therapy: Secondary | ICD-10-CM | POA: Insufficient documentation

## 2019-02-06 DIAGNOSIS — Y929 Unspecified place or not applicable: Secondary | ICD-10-CM | POA: Insufficient documentation

## 2019-02-06 DIAGNOSIS — S93401A Sprain of unspecified ligament of right ankle, initial encounter: Secondary | ICD-10-CM | POA: Insufficient documentation

## 2019-02-06 DIAGNOSIS — J45909 Unspecified asthma, uncomplicated: Secondary | ICD-10-CM | POA: Insufficient documentation

## 2019-02-06 DIAGNOSIS — Y939 Activity, unspecified: Secondary | ICD-10-CM | POA: Insufficient documentation

## 2019-02-06 DIAGNOSIS — S93409A Sprain of unspecified ligament of unspecified ankle, initial encounter: Secondary | ICD-10-CM

## 2019-02-06 DIAGNOSIS — Y999 Unspecified external cause status: Secondary | ICD-10-CM | POA: Insufficient documentation

## 2019-02-06 DIAGNOSIS — W109XXA Fall (on) (from) unspecified stairs and steps, initial encounter: Secondary | ICD-10-CM | POA: Insufficient documentation

## 2019-02-06 MED ORDER — ACETAMINOPHEN 500 MG PO TABS
1000.0000 mg | ORAL_TABLET | Freq: Once | ORAL | Status: AC
  Administered 2019-02-06: 1000 mg via ORAL

## 2019-02-06 MED ORDER — IBUPROFEN 600 MG PO TABS: 600.0000 mg | ORAL_TABLET | Freq: Four times a day (QID) | ORAL | 0 refills | Status: DC | PRN

## 2019-02-06 MED ORDER — ACETAMINOPHEN 500 MG PO TABS
ORAL_TABLET | ORAL | Status: AC
  Filled 2019-02-06: qty 2

## 2019-02-06 NOTE — ED Notes (Signed)
RN called to waiting room to speak with patient. Pt c/o ill-fitting boot and pain. Pt states she can not take ibuprofen. Boot changed from small to large. Tylenol ordered for pain-addressed red dye allergy-pt states she has taken this type of tylenol before with no problems. nad noted.

## 2019-02-06 NOTE — Discharge Instructions (Addendum)
Return if any problems. Schedule to see Dr. Romeo Apple for recheck in 1 week.

## 2019-02-06 NOTE — ED Triage Notes (Signed)
Patient fell down 2 steps last night, patient complains of right foot pain.

## 2019-02-06 NOTE — ED Provider Notes (Addendum)
[facility] EMERGENCY DEPARTMENT Provider Note   CSN: [id number] Arrival date & time: 02/06/19  1001    History   Chief Complaint Chief Complaint  Patient presents with  . Foot Pain    right foot    HPI Mallory RiggsGemini Willis is a 23 y.o. female.     The history is provided by the patient. No language interpreter was used.  Foot Pain  This is a new problem. The current episode started 6 to 12 hours ago. The problem occurs constantly. Nothing aggravates the symptoms. Nothing relieves the symptoms. She has tried nothing for the symptoms. The treatment provided no relief.  Pt reports she fell down 2 stars last pm.  Pt complains of foot and ankle pain.   Past Medical History:  Diagnosis Date  . Asthma   . Chronic back pain   . Gonorrhea   . Nausea and vomiting during pregnancy 08/14/2016  . Supervision of normal pregnancy 08/14/2016    [facility] at   7+2 weeks FOB  malick mcfarland 23 yo bm 2nd Dating By  LMP and US  Pap   NA GC/CT Initial:                36+wks: Genetic Screen NT/IT:  CF screen   Declined  Anatomic US  Flu vaccine  Tdap Recommended ~ 28wks Glucose Screen  2 hr GBS  Feed Preference  Contraception  Circumcision  Childbirth Classes  Pediatrician      Patient Active Problem List   Diagnosis Date Noted  . Right flank pain 01/06/2018  . Screening examination for STD (sexually transmitted disease) 01/06/2018  . Dysuria 01/06/2018  . Urinary tract infection with hematuria 01/06/2018  . Post term pregnancy at [redacted] weeks gestation 04/09/2017  . Gestational hypertension 04/09/2017  . Gonorrhea affecting pregnancy in third trimester 03/11/2017  . Marijuana use 09/21/2016  . Supervision of normal pregnancy 08/14/2016  . Nausea and vomiting during pregnancy 08/14/2016    Past Surgical History:  Procedure Laterality Date  . CESAREAN SECTION N/A 04/10/2017   Procedure: CESAREAN SECTION;  Surgeon: Tilda BurrowFerguson, John V, MD;  [facility];   Service: Obstetrics;  Laterality: N/A;     OB History    Gravida  1   Para  1   Term  1   Preterm      AB      Living  1     SAB      TAB      Ectopic      Multiple  0   Live Births  1            Home Medications    Prior to Admission medications   Medication Sig Start Date End Date Taking? Authorizing Provider  cetirizine (ZYRTEC) 10 MG tablet Take 10 mg by mouth daily.    [provider]  diclofenac sodium (VOLTAREN) 1 % GEL Apply 2 g topically 4 (four) times daily. 03/18/18   Joy, Shawn C, PA-C  HYDROcodone-acetaminophen (NORCO/VICODIN) 5-325 MG tablet Take 1 tablet by mouth every 6 (six) hours as needed for severe pain. 03/18/18   Joy, Shawn C, PA-C  ibuprofen (ADVIL) 600 MG tablet Take 1 tablet (600 mg total) by mouth every 6 (six) hours as needed. 02/06/19   Elson AreasSofia, Eliab Closson K, PA-C  predniSONE (STERAPRED UNI-PAK 21 TAB) 10 MG (21) TBPK tablet Take 6 tabs (60mg ) on day 1, 5 tabs (50mg ) on day 2, 4 tabs (40mg ) on  day 3, 3 tabs (30mg ) on day 4, 2 tabs (20mg ) on day 5, and 1 tab (10mg ) on day 6. 03/18/18   Joy, Hillard Danker, PA-C    Family History Family History  Problem Relation Age of Onset  . Other Mother        degenerative disc  . Other Father        heart surgery    Social History Social History   Tobacco Use  . Smoking status: Never Smoker  . Smokeless tobacco: Never Used  Substance Use Topics  . Alcohol use: No  . Drug use: No    Types: Marijuana    Comment: last used 08/2017     Allergies   Amoxicillin; Ceclor [cefaclor]; Red dye; and Ultram [tramadol]   Review of Systems Review of Systems  Musculoskeletal: Positive for joint swelling and myalgias.  Skin: Positive for color change.  All other systems reviewed and are negative.    Physical Exam Updated Vital Signs BP 109/73 (BP Location: Left Arm)   Pulse 77   Temp 98.1 F (36.7 C) (Oral)   Resp 14   Ht 5\' 9"  (1.753 m)   Wt 90.7 kg   SpO2 91%   BMI 29.53 kg/m   Physical  Exam Vitals signs and nursing note reviewed.  Cardiovascular:     Rate and Rhythm: Normal rate.  Pulmonary:     Effort: Pulmonary effort is normal.  Musculoskeletal:        General: Swelling and tenderness present.     Comments: Swollen tender lateral malleolus,  Pain to plaption   Skin:    General: Skin is warm.  Neurological:     General: No focal deficit present.     Mental Status: She is alert.  Psychiatric:        Mood and Affect: Mood normal.      ED Treatments / Results  Labs (all labs ordered are listed, but only abnormal results are displayed) Labs Reviewed - No data to display  EKG None  Radiology Dg Ankle Complete Right  Result Date: 02/06/2019 CLINICAL DATA:  Right foot and ankle pain since falling down 2 steps last night. EXAM: RIGHT ANKLE - COMPLETE 3+ VIEW COMPARISON:  None. FINDINGS: The mineralization and alignment are normal. There is a tiny ossific density adjacent to the fibular tip which could reflect a small avulsion fracture. There is mild lateral soft tissue swelling. The distal tibia is intact. There is no widening of the ankle mortise. IMPRESSION: Possible tiny avulsion fracture from the tip of the lateral malleolus with associated soft tissue swelling. Electronically Signed   By: Carey Bullocks M.D.   On: 02/06/2019 11:08   Dg Foot Complete Right  Result Date: 02/06/2019 CLINICAL DATA:  Right foot and ankle pain since falling down 2 steps last night. EXAM: RIGHT FOOT COMPLETE - 3+ VIEW COMPARISON:  None. FINDINGS: The mineralization and alignment are normal. There is no evidence of acute fracture or dislocation. The joint spaces are preserved. No focal soft tissue swelling or foreign body demonstrated. IMPRESSION: Normal right foot radiographs. Electronically Signed   By: Carey Bullocks M.D.   On: 02/06/2019 11:07    Procedures Procedures (including critical care time)  Medications Ordered in ED Medications - No data to display   Initial  Impression / Assessment and Plan / ED Course  I have reviewed the triage vital signs and the nursing notes.  Pertinent labs & imaging results that were available during my care of  the patient were reviewed by me and considered in my medical decision making (see chart for details).       MDM  Xray reviewed. Possible avulsion/  Most likely sprain.  On reevaluation.  I am unable to awaken pt.  Question sedation from medication.  Pt observed until Rn is able to arouse.  02 sat decreased while asleep.      Pt advised to follow up with Dr. Romeo Apple for recheck in 1 week.  Pt placed in a cam walker and given crutches. Pt told rn she wanted percocet for pain.  I gave rx for ibuprofen.  Pt now states she is allergic.  Pt advised tylenol.   Final Clinical Impressions(s) / ED Diagnoses   Final diagnoses:  Sprain of ankle, unspecified laterality, unspecified ligament, initial encounter    ED Discharge Orders         Ordered    ibuprofen (ADVIL) 600 MG tablet  Every 6 hours PRN     02/06/19 1116        An After Visit Summary was printed and given to the patient.    Elson Areas, PA-C 02/06/19 1227    Elson Areas, PA-C 02/06/19 1232    Elson Areas, [place] 02/06/19 1323    Geoffery Lyons, MD 02/06/19 1357

## 2019-02-06 NOTE — ED Notes (Signed)
Patient stated she cannot take ibuprofen due to stomach ulcers. Wanted prescription for "roxy or percocets". Told pt she was not prescribed narcotics due to diagnosis of ankle sprian. Told pt she could take Tylenol if she could not take ibuprofen. Patient very unsteady on feet when walking and could not keep her eyes open. Pt denied taking any medications that cause sedation.

## 2019-02-09 ENCOUNTER — Telehealth: Payer: Self-pay | Admitting: Orthopedic Surgery

## 2019-02-09 NOTE — Telephone Encounter (Signed)
Patient had called 02/07/2019 to inquire about appointment following [facility] visit for problem: ankle sprain. States hurting. Had appointment scheduled with primary care [facility]/Dr Nyland. Discussed requesting referral while at appointment. Appointment pending. Stated I could try to hold (for a day) a slot on Friday. By today, 02/09/19, have received verbal authorization from Tanya at Dr Joyce Copa office, and faxed referral is to follow.  Have been trying to reach patient, and unable to reach at either phone# on file. Have called and faxed Dr Joyce Copa office for alternate phone number.

## 2019-02-09 NOTE — Telephone Encounter (Signed)
Called back to patient - phone number auto-message still is "this number cannot be reached at this time. Please try your call again later." Per Dr Lysbeth Galas (patient's PCP office, same phone number on file there.)

## 2019-02-10 NOTE — Telephone Encounter (Signed)
No return call from patient.

## 2019-04-08 ENCOUNTER — Other Ambulatory Visit: Payer: Self-pay

## 2019-04-08 ENCOUNTER — Emergency Department (HOSPITAL_COMMUNITY): Payer: No Typology Code available for payment source

## 2019-04-08 ENCOUNTER — Encounter (HOSPITAL_COMMUNITY): Payer: Self-pay | Admitting: *Deleted

## 2019-04-08 ENCOUNTER — Emergency Department (HOSPITAL_COMMUNITY)
Admission: EM | Admit: 2019-04-08 | Discharge: 2019-04-08 | Disposition: A | Discharge status: Home / Self Care | Payer: No Typology Code available for payment source | Attending: Emergency Medicine | Admitting: Emergency Medicine

## 2019-04-08 ENCOUNTER — Encounter (HOSPITAL_COMMUNITY): Payer: Self-pay

## 2019-04-08 ENCOUNTER — Emergency Department (HOSPITAL_COMMUNITY)
Admission: EM | Admit: 2019-04-08 | Discharge: 2019-04-08 | Disposition: A | Discharge status: Home / Self Care | Payer: No Typology Code available for payment source | Source: Home / Self Care | Attending: Emergency Medicine | Admitting: Emergency Medicine

## 2019-04-08 DIAGNOSIS — R1011 Right upper quadrant pain: Secondary | ICD-10-CM | POA: Diagnosis not present

## 2019-04-08 DIAGNOSIS — S161XXA Strain of muscle, fascia and tendon at neck level, initial encounter: Secondary | ICD-10-CM

## 2019-04-08 DIAGNOSIS — M542 Cervicalgia: Secondary | ICD-10-CM | POA: Insufficient documentation

## 2019-04-08 DIAGNOSIS — J45909 Unspecified asthma, uncomplicated: Secondary | ICD-10-CM | POA: Insufficient documentation

## 2019-04-08 DIAGNOSIS — R0789 Other chest pain: Secondary | ICD-10-CM | POA: Diagnosis not present

## 2019-04-08 DIAGNOSIS — M25562 Pain in left knee: Secondary | ICD-10-CM | POA: Insufficient documentation

## 2019-04-08 DIAGNOSIS — R1031 Right lower quadrant pain: Secondary | ICD-10-CM | POA: Diagnosis not present

## 2019-04-08 DIAGNOSIS — R51 Headache: Secondary | ICD-10-CM | POA: Insufficient documentation

## 2019-04-08 DIAGNOSIS — M25561 Pain in right knee: Secondary | ICD-10-CM | POA: Diagnosis not present

## 2019-04-08 DIAGNOSIS — Y92410 Unspecified street and highway as the place of occurrence of the external cause: Secondary | ICD-10-CM | POA: Insufficient documentation

## 2019-04-08 DIAGNOSIS — Y999 Unspecified external cause status: Secondary | ICD-10-CM | POA: Insufficient documentation

## 2019-04-08 DIAGNOSIS — Y9389 Activity, other specified: Secondary | ICD-10-CM | POA: Insufficient documentation

## 2019-04-08 DIAGNOSIS — Z79899 Other long term (current) drug therapy: Secondary | ICD-10-CM | POA: Insufficient documentation

## 2019-04-08 LAB — CBC
HCT: 28.4 % — ABNORMAL LOW (ref 36.0–46.0)
Hemoglobin: 8.4 g/dL — ABNORMAL LOW (ref 12.0–15.0)
MCH: 23.3 pg — ABNORMAL LOW (ref 26.0–34.0)
MCHC: 29.6 g/dL — ABNORMAL LOW (ref 30.0–36.0)
MCV: 78.9 fL — ABNORMAL LOW (ref 80.0–100.0)
Platelets: 335 10*3/uL (ref 150–400)
RBC: 3.6 MIL/uL — ABNORMAL LOW (ref 3.87–5.11)
RDW: 18.1 % — ABNORMAL HIGH (ref 11.5–15.5)
WBC: 8.7 10*3/uL (ref 4.0–10.5)
nRBC: 0 % (ref 0.0–0.2)

## 2019-04-08 LAB — URINALYSIS, ROUTINE W REFLEX MICROSCOPIC
Bilirubin Urine: NEGATIVE
Glucose, UA: NEGATIVE mg/dL
Hgb urine dipstick: NEGATIVE
Ketones, ur: NEGATIVE mg/dL
Leukocytes,Ua: NEGATIVE
Nitrite: POSITIVE — AB
Protein, ur: NEGATIVE mg/dL
Specific Gravity, Urine: 1.018 (ref 1.005–1.030)
pH: 7 (ref 5.0–8.0)

## 2019-04-08 LAB — COMPREHENSIVE METABOLIC PANEL
ALT: 11 U/L (ref 0–44)
AST: 16 U/L (ref 15–41)
Albumin: 3.6 g/dL (ref 3.5–5.0)
Alkaline Phosphatase: 58 U/L (ref 38–126)
Anion gap: 6 (ref 5–15)
BUN: 10 mg/dL (ref 6–20)
CO2: 25 mmol/L (ref 22–32)
Calcium: 9 mg/dL (ref 8.9–10.3)
Chloride: 107 mmol/L (ref 98–111)
Creatinine, Ser: 0.76 mg/dL (ref 0.44–1.00)
GFR calc Af Amer: >60 mL/min (ref >60)
GFR calc non Af Amer: >60 mL/min (ref >60)
Glucose, Bld: 85 mg/dL (ref 70–99)
Potassium: 3.8 mmol/L (ref 3.5–5.1)
Sodium: 138 mmol/L (ref 135–145)
Total Bilirubin: 0.3 mg/dL (ref 0.3–1.2)
Total Protein: 6.5 g/dL (ref 6.5–8.1)

## 2019-04-08 LAB — POC URINE PREG, ED: Preg Test, Ur: NEGATIVE

## 2019-04-08 MED ORDER — HYDROCODONE-ACETAMINOPHEN 5-325 MG PO TABS
2.0000 | ORAL_TABLET | Freq: Once | ORAL | Status: AC
  Administered 2019-04-08: 2 via ORAL
  Filled 2019-04-08: qty 2

## 2019-04-08 MED ORDER — IOHEXOL 300 MG/ML  SOLN
100.0000 mL | Freq: Once | INTRAMUSCULAR | Status: AC | PRN
  Administered 2019-04-08: 08:00:00 100 mL via INTRAVENOUS

## 2019-04-08 MED ORDER — SODIUM CHLORIDE 0.9 % IV BOLUS
1000.0000 mL | Freq: Once | INTRAVENOUS | Status: AC
  Administered 2019-04-08: 07:00:00 1000 mL via INTRAVENOUS

## 2019-04-08 MED ORDER — FENTANYL CITRATE (PF) 100 MCG/2ML IJ SOLN
50.0000 ug | Freq: Once | INTRAMUSCULAR | Status: AC
  Administered 2019-04-08: 10:00:00 50 ug via INTRAVENOUS
  Filled 2019-04-08: qty 2

## 2019-04-08 MED ORDER — CYCLOBENZAPRINE HCL 10 MG PO TABS: 10.0000 mg | ORAL_TABLET | Freq: Two times a day (BID) | ORAL | 0 refills | Status: DC | PRN

## 2019-04-08 MED ORDER — MORPHINE SULFATE (PF) 4 MG/ML IV SOLN
4.0000 mg | Freq: Once | INTRAVENOUS | Status: AC
  Administered 2019-04-08: 07:00:00 4 mg via INTRAVENOUS
  Filled 2019-04-08: qty 1

## 2019-04-08 MED ORDER — DICLOFENAC SODIUM 50 MG PO TBEC: 50.0000 mg | DELAYED_RELEASE_TABLET | Freq: Two times a day (BID) | ORAL | 0 refills | Status: DC

## 2019-04-08 MED ORDER — HYDROCODONE-ACETAMINOPHEN 5-325 MG PO TABS: 1.0000 | ORAL_TABLET | Freq: Four times a day (QID) | ORAL | 0 refills | Status: DC | PRN

## 2019-04-08 MED ORDER — ONDANSETRON HCL 4 MG/2ML IJ SOLN
4.0000 mg | Freq: Once | INTRAMUSCULAR | Status: AC
  Administered 2019-04-08: 4 mg via INTRAVENOUS
  Filled 2019-04-08: qty 2

## 2019-04-08 MED ORDER — KETOROLAC TROMETHAMINE 60 MG/2ML IM SOLN
60.0000 mg | Freq: Once | INTRAMUSCULAR | Status: AC
  Administered 2019-04-08: 60 mg via INTRAMUSCULAR
  Filled 2019-04-08: qty 2
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
## 2019-04-08 NOTE — Discharge Instructions (Addendum)
All of your x-rays were normal.  You will be sore for several days.  Ice pack to painful areas.  Medication for pain and muscle spasm.
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
## 2019-04-08 NOTE — ED Provider Notes (Signed)
Patient rechecked prior to discharge.  No focal neurological deficits.  X-rays reviewed and were all normal.  Discharge medication Voltaren 50 mg and Flexeril 10 mg.   Nat Christen, MD 04/08/19 [phone]

## 2019-04-08 NOTE — Discharge Instructions (Addendum)
Return here as needed. Follow up with your doctor. Use ice and heat on the areas that are sore. °

## 2019-04-10 ENCOUNTER — Other Ambulatory Visit: Payer: Self-pay

## 2019-04-10 ENCOUNTER — Emergency Department (HOSPITAL_COMMUNITY)
Admission: EM | Admit: 2019-04-10 | Discharge: 2019-04-10 | Disposition: A | Discharge status: Home / Self Care | Payer: No Typology Code available for payment source | Attending: Emergency Medicine | Admitting: Emergency Medicine

## 2019-04-10 DIAGNOSIS — M25562 Pain in left knee: Secondary | ICD-10-CM | POA: Insufficient documentation

## 2019-04-10 DIAGNOSIS — M542 Cervicalgia: Secondary | ICD-10-CM | POA: Diagnosis present

## 2019-04-10 DIAGNOSIS — Z79899 Other long term (current) drug therapy: Secondary | ICD-10-CM | POA: Diagnosis not present

## 2019-04-10 DIAGNOSIS — M25561 Pain in right knee: Secondary | ICD-10-CM | POA: Diagnosis not present

## 2019-04-10 DIAGNOSIS — R1030 Lower abdominal pain, unspecified: Secondary | ICD-10-CM | POA: Diagnosis not present

## 2019-04-10 DIAGNOSIS — M7918 Myalgia, other site: Secondary | ICD-10-CM

## 2019-04-10 DIAGNOSIS — J45909 Unspecified asthma, uncomplicated: Secondary | ICD-10-CM | POA: Insufficient documentation
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
## 2019-04-10 NOTE — Discharge Instructions (Addendum)
Please read attached information. If you experience any new or worsening signs or symptoms please return to the emergency room for evaluation. Please follow-up with your primary care provider or specialist as discussed. Please use tylenol as needed for pain.  °
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
## 2019-07-18 ENCOUNTER — Emergency Department (HOSPITAL_COMMUNITY): Payer: Self-pay

## 2019-07-18 ENCOUNTER — Other Ambulatory Visit: Payer: Self-pay

## 2019-07-18 ENCOUNTER — Emergency Department (HOSPITAL_COMMUNITY)
Admission: EM | Admit: 2019-07-18 | Discharge: 2019-07-18 | Disposition: A | Discharge status: Home / Self Care | Payer: Self-pay | Attending: Emergency Medicine | Admitting: Emergency Medicine

## 2019-07-18 ENCOUNTER — Encounter (HOSPITAL_COMMUNITY): Payer: Self-pay | Admitting: Emergency Medicine

## 2019-07-18 DIAGNOSIS — K8020 Calculus of gallbladder without cholecystitis without obstruction: Secondary | ICD-10-CM | POA: Insufficient documentation

## 2019-07-18 DIAGNOSIS — R1013 Epigastric pain: Secondary | ICD-10-CM | POA: Insufficient documentation

## 2019-07-18 DIAGNOSIS — R109 Unspecified abdominal pain: Secondary | ICD-10-CM

## 2019-07-18 DIAGNOSIS — R1011 Right upper quadrant pain: Secondary | ICD-10-CM | POA: Insufficient documentation

## 2019-07-18 DIAGNOSIS — J45909 Unspecified asthma, uncomplicated: Secondary | ICD-10-CM | POA: Insufficient documentation

## 2019-07-18 DIAGNOSIS — F17210 Nicotine dependence, cigarettes, uncomplicated: Secondary | ICD-10-CM | POA: Insufficient documentation

## 2019-07-18 LAB — COMPREHENSIVE METABOLIC PANEL
ALT: 6 U/L (ref 0–44)
AST: 12 U/L — ABNORMAL LOW (ref 15–41)
Albumin: 3.7 g/dL (ref 3.5–5.0)
Alkaline Phosphatase: 64 U/L (ref 38–126)
Anion gap: 7 (ref 5–15)
BUN: 7 mg/dL (ref 6–20)
CO2: 23 mmol/L (ref 22–32)
Calcium: 8.7 mg/dL — ABNORMAL LOW (ref 8.9–10.3)
Chloride: 107 mmol/L (ref 98–111)
Creatinine, Ser: 0.67 mg/dL (ref 0.44–1.00)
GFR calc Af Amer: >60 mL/min (ref >60)
GFR calc non Af Amer: >60 mL/min (ref >60)
Glucose, Bld: 89 mg/dL (ref 70–99)
Potassium: 3.3 mmol/L — ABNORMAL LOW (ref 3.5–5.1)
Sodium: 137 mmol/L (ref 135–145)
Total Bilirubin: 0.2 mg/dL — ABNORMAL LOW (ref 0.3–1.2)
Total Protein: 6.6 g/dL (ref 6.5–8.1)

## 2019-07-18 LAB — URINALYSIS, ROUTINE W REFLEX MICROSCOPIC
Bilirubin Urine: NEGATIVE
Glucose, UA: NEGATIVE mg/dL
Hgb urine dipstick: NEGATIVE
Ketones, ur: NEGATIVE mg/dL
Leukocytes,Ua: NEGATIVE
Nitrite: POSITIVE — AB
Protein, ur: NEGATIVE mg/dL
Specific Gravity, Urine: 1.008 (ref 1.005–1.030)
pH: 7 (ref 5.0–8.0)

## 2019-07-18 LAB — CBC WITH DIFFERENTIAL/PLATELET
Abs Immature Granulocytes: 0.02 10*3/uL (ref 0.00–0.07)
Basophils Absolute: 0 10*3/uL (ref 0.0–0.1)
Basophils Relative: 0 %
Eosinophils Absolute: 0.2 10*3/uL (ref 0.0–0.5)
Eosinophils Relative: 2 %
HCT: 32.4 % — ABNORMAL LOW (ref 36.0–46.0)
Hemoglobin: 9.3 g/dL — ABNORMAL LOW (ref 12.0–15.0)
Immature Granulocytes: 0 %
Lymphocytes Relative: 28 %
Lymphs Abs: 2.5 10*3/uL (ref 0.7–4.0)
MCH: 22.1 pg — ABNORMAL LOW (ref 26.0–34.0)
MCHC: 28.7 g/dL — ABNORMAL LOW (ref 30.0–36.0)
MCV: 77.1 fL — ABNORMAL LOW (ref 80.0–100.0)
Monocytes Absolute: 0.9 10*3/uL (ref 0.1–1.0)
Monocytes Relative: 10 %
Neutro Abs: 5.3 10*3/uL (ref 1.7–7.7)
Neutrophils Relative %: 60 %
Platelets: 349 10*3/uL (ref 150–400)
RBC: 4.2 MIL/uL (ref 3.87–5.11)
RDW: 18.5 % — ABNORMAL HIGH (ref 11.5–15.5)
WBC: 9 10*3/uL (ref 4.0–10.5)
nRBC: 0 % (ref 0.0–0.2)

## 2019-07-18 LAB — LIPASE, BLOOD: Lipase: 20 U/L (ref 11–51)

## 2019-07-18 LAB — HCG, QUANTITATIVE, PREGNANCY: hCG, Beta Chain, Quant, S: <1 m[IU]/mL (ref <5)

## 2019-07-18 MED ORDER — FENTANYL CITRATE (PF) 100 MCG/2ML IJ SOLN
50.0000 ug | Freq: Once | INTRAMUSCULAR | Status: AC
  Administered 2019-07-18: 50 ug via INTRAVENOUS
  Filled 2019-07-18: qty 2

## 2019-07-18 MED ORDER — OXYCODONE-ACETAMINOPHEN 5-325 MG PO TABS: 1.0000 | ORAL_TABLET | Freq: Four times a day (QID) | ORAL | 0 refills | Status: DC | PRN

## 2019-07-18 MED ORDER — LIDOCAINE VISCOUS HCL 2 % MT SOLN
15.0000 mL | Freq: Once | OROMUCOSAL | Status: AC
  Administered 2019-07-18: 05:00:00 15 mL via OROMUCOSAL
  Filled 2019-07-18: qty 15

## 2019-07-18 MED ORDER — LACTATED RINGERS IV BOLUS
1000.0000 mL | Freq: Once | INTRAVENOUS | Status: AC
  Administered 2019-07-18: 1000 mL via INTRAVENOUS

## 2019-07-18 MED ORDER — ALUM & MAG HYDROXIDE-SIMETH 200-200-20 MG/5ML PO SUSP
15.0000 mL | Freq: Once | ORAL | Status: AC
  Administered 2019-07-18: 05:00:00 15 mL via ORAL
  Filled 2019-07-18: qty 30

## 2019-07-18 MED ORDER — ONDANSETRON 4 MG PO TBDP: 4.0000 mg | ORAL_TABLET | Freq: Three times a day (TID) | ORAL | 0 refills | Status: DC | PRN

## 2019-07-18 MED ORDER — ONDANSETRON HCL 4 MG/2ML IJ SOLN
4.0000 mg | Freq: Once | INTRAMUSCULAR | Status: DC
  Filled 2019-07-18: qty 2

## 2019-07-18 MED ORDER — KETOROLAC TROMETHAMINE 30 MG/ML IJ SOLN
30.0000 mg | Freq: Once | INTRAMUSCULAR | Status: AC
  Administered 2019-07-18: 30 mg via INTRAVENOUS
  Filled 2019-07-18: qty 1

## 2019-07-18 NOTE — ED Notes (Signed)
Patient transported to Ultrasound 

## 2019-07-18 NOTE — ED Provider Notes (Signed)
Blood pressure 120/70, pulse 80, temperature 97.8 F (36.6 C), temperature source Oral, resp. rate 16, height 5\' 9"  (1.753 m), weight 99.8 kg, last menstrual period 07/11/2019, SpO2 100 %.  Assuming care from Dr. Dayna Barker.  In short, Mallory Willis is a 23 y.o. female with a chief complaint of Abdominal Pain .  Refer to the original H&P for additional details.  The current plan of care is to f/u on RUQ [place] and reassess.  08:37 AM  Right upper quadrant ultrasound reviewed.  There are stones in the gallbladder along with minimal gallbladder wall thickening.  No other evidence or signs to suggest acute cholecystitis.  Lab work does not support this diagnosis either.  Patient is tolerating PO and pain is well controlled in the emergency department.  Possible symptomatic cholelithiasis.  Will refer patient to outpatient surgery for evaluation and possible treatment.  Called in prescription for Percocet and Zofran to patient's pharmacy.  I did review the [place] controlled substance database prior to prescribing this medication.  Will send the UA for culture.  No antibiotics at this time as the patient is not having any UTI symptoms.  Discussed ED return precautions in detail.  Patient comfortable with the plan at discharge.     Margette Fast, MD 07/18/19 [phone]

## 2019-07-18 NOTE — Discharge Instructions (Signed)
You were seen in the emergency department today with abdominal pain.  You do have stones in the gallbladder which may be causing your pain.  Please call the surgeon listed today to schedule the next available appointment where you can discuss possibly having the gallbladder removed.  If your pain, nausea, vomiting significantly worsen or you develop fever he should return to the emergency department immediately.  I have called in prescription medications for pain to take only as needed for severe pain.  Do not drive a car while taking this medication.  Percocet may cause constipation and you can take over-the-counter constipation medication for this.  Your urine test today shows some sign of possible infection but you are not experiencing symptoms.  I will send this for culture and you will be called to start antibiotics if your culture grows bacteria.

## 2019-07-18 NOTE — ED Triage Notes (Signed)
Pt C/O mid abdominal pain that started at 0000 tonight. Denies N/V/D. Pt reports hx of ulcers.

## 2019-07-18 NOTE — ED Provider Notes (Signed)
Emergency Department Provider Note   I have reviewed the triage vital signs and the nursing notes.   HISTORY  Chief Complaint Abdominal Pain   HPI Mallory Willis is a 23 y.o. female who presents to the emergency department today with abdominal pain.  Patient states that she has had some epigastric abdominal pain and slight right upper quadrant pain for the last 3 days has been progressively worsening.  She states that it has not really gone away.  She is had some nausea.  No vomiting.  States she had an episode like this previously but did not last as long.  She has a history of ulcers but this seems to feel different.  She states she does drink alcohol once in a while but not before this started.  No history pancreatitis or gallbladder issues that she knows of.  On review of records it is appreciated CT scan of her abdomen done few months ago there showed diffuse gallbladder wall thickening but no stones at that time. pain hasn't changed based on meals recently.  No other associated or modifying symptoms.    Past Medical History:  Diagnosis Date  . Asthma   . Chronic back pain   . Gonorrhea   . Nausea and vomiting during pregnancy 08/14/2016  . Supervision of normal pregnancy 08/14/2016    [facility] at   7+2 weeks FOB  malick mcfarland 23 yo bm 2nd Dating By  LMP and [place]  Pap   NA GC/CT Initial:                36+wks: Genetic Screen NT/IT:  CF screen   Declined  Anatomic [place]  Flu vaccine  Tdap Recommended ~ 28wks Glucose Screen  2 hr GBS  Feed Preference  Contraception  Circumcision  Childbirth Classes  Pediatrician      Patient Active Problem List   Diagnosis Date Noted  . Right flank pain 01/06/2018  . Screening examination for STD (sexually transmitted disease) 01/06/2018  . Dysuria 01/06/2018  . Urinary tract infection with hematuria 01/06/2018  . Post term pregnancy at [redacted] weeks gestation 04/09/2017  . Gestational hypertension 04/09/2017  . Gonorrhea  affecting pregnancy in third trimester 03/11/2017  . Marijuana use 09/21/2016  . Supervision of normal pregnancy 08/14/2016  . Nausea and vomiting during pregnancy 08/14/2016    Past Surgical History:  Procedure Laterality Date  . CESAREAN SECTION N/A 04/10/2017   Procedure: CESAREAN SECTION;  Surgeon: Tilda Burrow, MD;  Location: [facility];  Service: Obstetrics;  Laterality: N/A;    Current Outpatient Rx  . Order #: [id number] Class: Historical Med  . Order #: [id number] Class: Historical Med  . Order #: [id number] Class: Print  . Order #: [id number] Class: Print  . Order #: [id number] Class: Normal  . Order #: [id number] Class: Normal    Allergies Hydrocodone, Amoxicillin, Ceclor [cefaclor], Red dye, and Ultram [tramadol]  Family History  Problem Relation Age of Onset  . Other Mother        degenerative disc  . Other Father        heart surgery    Social History Social History   Tobacco Use  . Smoking status: Current Every Day Smoker    Packs/day: 0.50    Years: 3.00    Pack years: 1.50  . Smokeless tobacco: Never Used  Substance Use Topics  . Alcohol use: No  . Drug use: No    Types: Marijuana    Comment: last used 08/2017  Review of Systems  All other systems negative except as documented in the HPI. All pertinent positives and negatives as reviewed in the HPI. ____________________________________________   PHYSICAL EXAM:  VITAL SIGNS: ED Triage Vitals  Enc Vitals Group     BP 07/18/19 0420 (!) 149/102     Pulse Rate 07/18/19 0420 77     Resp 07/18/19 0420 17     Temp 07/18/19 0420 97.8 F (36.6 C)     Temp Source 07/18/19 0420 Oral     SpO2 07/18/19 0420 100 %     Weight 07/18/19 0417 220 lb (99.8 kg)     Height 07/18/19 0417 5\' 9"  (1.753 m)    Constitutional: Alert and oriented. Well appearing and in no acute distress. Eyes: Conjunctivae are normal. PERRL. EOMI. Head: Atraumatic. Nose: No congestion/rhinnorhea. Mouth/Throat: Mucous  membranes are moist.  Oropharynx non-erythematous. Neck: No stridor.  No meningeal signs.   Cardiovascular: Normal rate, regular rhythm. Good peripheral circulation. Grossly normal heart sounds.   Respiratory: Normal respiratory effort.  No retractions. Lungs CTAB. Gastrointestinal: Soft and mild TTP to epigastric and RUQ area without rebound or guarding. No distention.  Musculoskeletal: No lower extremity tenderness nor edema. No gross deformities of extremities. Neurologic:  Normal speech and language. No gross focal neurologic deficits are appreciated.  Skin:  Skin is warm, dry and intact. No rash noted.  ____________________________________________   LABS (all labs ordered are listed, but only abnormal results are displayed)  Labs Reviewed  CBC WITH DIFFERENTIAL/PLATELET - Abnormal; Notable for the following components:      Result Value   Hemoglobin 9.3 (*)    HCT 32.4 (*)    MCV 77.1 (*)    MCH 22.1 (*)    MCHC 28.7 (*)    RDW 18.5 (*)    All other components within normal limits  COMPREHENSIVE METABOLIC PANEL - Abnormal; Notable for the following components:   Potassium 3.3 (*)    Calcium 8.7 (*)    AST 12 (*)    Total Bilirubin 0.2 (*)    All other components within normal limits  URINALYSIS, ROUTINE W REFLEX MICROSCOPIC - Abnormal; Notable for the following components:   APPearance HAZY (*)    Nitrite POSITIVE (*)    Bacteria, UA MANY (*)    All other components within normal limits  URINE CULTURE  LIPASE, BLOOD  HCG, QUANTITATIVE, PREGNANCY   ____________________________________________    INITIAL IMPRESSION / ASSESSMENT AND PLAN / ED COURSE  Pancreatitis vs PUD vs cholecystitis. Plan for labs and symptomatic treatment.  Urine abnormal but not obviously infected. Similar to July. Rechecked with patient, no urinary symptoms. Pain improved. Will send culture. Care transferred pending [place] results and appropriate disposition.     Pertinent labs & imaging results  that were available during my care of the patient were reviewed by me and considered in my medical decision making (see chart for details).  ___________________________________________  FINAL CLINICAL IMPRESSION(S) / ED DIAGNOSES  Final diagnoses:  None     MEDICATIONS GIVEN DURING THIS VISIT:  Medications  ondansetron (ZOFRAN) injection 4 mg (4 mg Intravenous Not Given 07/18/19 0507)  alum & mag hydroxide-simeth (MAALOX/MYLANTA) 200-200-20 MG/5ML suspension 15 mL (15 mLs Oral Given 07/18/19 0505)  lidocaine (XYLOCAINE) 2 % viscous mouth solution 15 mL (15 mLs Mouth/Throat Given 07/18/19 0505)  lactated ringers bolus 1,000 mL (1,000 mLs Intravenous New Bag/Given 07/18/19 0505)     NEW OUTPATIENT MEDICATIONS STARTED DURING THIS VISIT:  New Prescriptions  No medications on file    Note:  This note was prepared with assistance of Dragon voice recognition software. Occasional wrong-word or sound-a-like substitutions may have occurred due to the inherent limitations of voice recognition software.   Marily MemosMesner, Sahith Nurse, MD 07/18/19 [phone]

## 2019-07-20 LAB — URINE CULTURE: Culture: >=100000 — AB

## 2019-07-21 ENCOUNTER — Telehealth: Payer: Self-pay | Admitting: *Deleted

## 2019-07-21 NOTE — Telephone Encounter (Signed)
Post ED Visit - Positive Culture Follow-up  Culture report reviewed by antimicrobial stewardship pharmacist: [place] Team []  Elenor Quinones, Pharm.D. []  Heide Guile, Pharm.D., BCPS AQ-ID []  Parks Neptune, Pharm.D., BCPS []  Alycia Rossetti, Pharm.D., BCPS []  [place], Pharm.D., BCPS, AAHIVP []  Legrand Como, Pharm.D., BCPS, AAHIVP []  Salome Arnt, PharmD, BCPS []  Johnnette Gourd, PharmD, BCPS []  Hughes Better, PharmD, BCPS []  Leeroy Cha, PharmD []  Laqueta Linden, PharmD, BCPS []  Albertina Parr, PharmD  [place] Team []  Leodis Sias, PharmD []  Lindell Spar, PharmD []  Royetta Asal, PharmD []  Graylin Shiver, Rph []  Rema Fendt) Glennon Mac, PharmD []  Arlyn Dunning, PharmD []  Netta Cedars, PharmD []  Dia Sitter, PharmD []  Leone Haven, PharmD []  Gretta Arab, PharmD []  Theodis Shove, PharmD []  Peggyann Juba, PharmD []  Reuel Boom, PharmD   Positive urine culture, Benedetto Goad, PA No UTI symptoms and no further patient follow-up is required at this time.  Harlon Flor [facility] 07/21/2019, 10:54 AM

## 2019-08-14 ENCOUNTER — Emergency Department (HOSPITAL_COMMUNITY): Payer: Self-pay

## 2019-08-14 ENCOUNTER — Emergency Department (HOSPITAL_COMMUNITY)
Admission: EM | Admit: 2019-08-14 | Discharge: 2019-08-14 | Disposition: A | Discharge status: Home / Self Care | Payer: Self-pay | Attending: Emergency Medicine | Admitting: Emergency Medicine

## 2019-08-14 ENCOUNTER — Other Ambulatory Visit: Payer: Self-pay

## 2019-08-14 ENCOUNTER — Encounter (HOSPITAL_COMMUNITY): Payer: Self-pay

## 2019-08-14 DIAGNOSIS — M79644 Pain in right finger(s): Secondary | ICD-10-CM | POA: Insufficient documentation

## 2019-08-14 DIAGNOSIS — R1084 Generalized abdominal pain: Secondary | ICD-10-CM | POA: Insufficient documentation

## 2019-08-14 DIAGNOSIS — K8020 Calculus of gallbladder without cholecystitis without obstruction: Secondary | ICD-10-CM | POA: Insufficient documentation

## 2019-08-14 DIAGNOSIS — Z79899 Other long term (current) drug therapy: Secondary | ICD-10-CM | POA: Insufficient documentation

## 2019-08-14 DIAGNOSIS — N83202 Unspecified ovarian cyst, left side: Secondary | ICD-10-CM | POA: Insufficient documentation

## 2019-08-14 DIAGNOSIS — N83201 Unspecified ovarian cyst, right side: Secondary | ICD-10-CM | POA: Insufficient documentation

## 2019-08-14 DIAGNOSIS — K5900 Constipation, unspecified: Secondary | ICD-10-CM | POA: Insufficient documentation

## 2019-08-14 DIAGNOSIS — K0889 Other specified disorders of teeth and supporting structures: Secondary | ICD-10-CM | POA: Insufficient documentation

## 2019-08-14 DIAGNOSIS — D649 Anemia, unspecified: Secondary | ICD-10-CM | POA: Insufficient documentation

## 2019-08-14 DIAGNOSIS — F17210 Nicotine dependence, cigarettes, uncomplicated: Secondary | ICD-10-CM | POA: Insufficient documentation

## 2019-08-14 LAB — COMPREHENSIVE METABOLIC PANEL
ALT: 10 U/L (ref 0–44)
AST: 17 U/L (ref 15–41)
Albumin: 3.7 g/dL (ref 3.5–5.0)
Alkaline Phosphatase: 81 U/L (ref 38–126)
Anion gap: 9 (ref 5–15)
BUN: 8 mg/dL (ref 6–20)
CO2: 23 mmol/L (ref 22–32)
Calcium: 8.6 mg/dL — ABNORMAL LOW (ref 8.9–10.3)
Chloride: 106 mmol/L (ref 98–111)
Creatinine, Ser: 0.74 mg/dL (ref 0.44–1.00)
GFR calc Af Amer: >60 mL/min (ref >60)
GFR calc non Af Amer: >60 mL/min (ref >60)
Glucose, Bld: 72 mg/dL (ref 70–99)
Potassium: 3.7 mmol/L (ref 3.5–5.1)
Sodium: 138 mmol/L (ref 135–145)
Total Bilirubin: 0.2 mg/dL — ABNORMAL LOW (ref 0.3–1.2)
Total Protein: 6.8 g/dL (ref 6.5–8.1)

## 2019-08-14 LAB — CBC WITH DIFFERENTIAL/PLATELET
Abs Immature Granulocytes: 0.02 10*3/uL (ref 0.00–0.07)
Basophils Absolute: 0 10*3/uL (ref 0.0–0.1)
Basophils Relative: 1 %
Eosinophils Absolute: 0.2 10*3/uL (ref 0.0–0.5)
Eosinophils Relative: 2 %
HCT: 31.1 % — ABNORMAL LOW (ref 36.0–46.0)
Hemoglobin: 8.9 g/dL — ABNORMAL LOW (ref 12.0–15.0)
Immature Granulocytes: 0 %
Lymphocytes Relative: 30 %
Lymphs Abs: 2.4 10*3/uL (ref 0.7–4.0)
MCH: 22 pg — ABNORMAL LOW (ref 26.0–34.0)
MCHC: 28.6 g/dL — ABNORMAL LOW (ref 30.0–36.0)
MCV: 76.8 fL — ABNORMAL LOW (ref 80.0–100.0)
Monocytes Absolute: 0.9 10*3/uL (ref 0.1–1.0)
Monocytes Relative: 11 %
Neutro Abs: 4.4 10*3/uL (ref 1.7–7.7)
Neutrophils Relative %: 56 %
Platelets: 385 10*3/uL (ref 150–400)
RBC: 4.05 MIL/uL (ref 3.87–5.11)
RDW: 18.9 % — ABNORMAL HIGH (ref 11.5–15.5)
WBC: 7.9 10*3/uL (ref 4.0–10.5)
nRBC: 0 % (ref 0.0–0.2)

## 2019-08-14 LAB — LIPASE, BLOOD: Lipase: 18 U/L (ref 11–51)

## 2019-08-14 LAB — I-STAT BETA HCG BLOOD, ED (MC, WL, AP ONLY): I-stat hCG, quantitative: <5 m[IU]/mL (ref <5)

## 2019-08-14 MED ORDER — HYDROMORPHONE HCL 1 MG/ML IJ SOLN
0.5000 mg | Freq: Once | INTRAMUSCULAR | Status: AC
  Administered 2019-08-14: 13:00:00 0.5 mg via INTRAVENOUS
  Filled 2019-08-14: qty 1

## 2019-08-14 MED ORDER — KETOROLAC TROMETHAMINE 15 MG/ML IJ SOLN
15.0000 mg | Freq: Once | INTRAMUSCULAR | Status: DC
  Filled 2019-08-14: qty 1

## 2019-08-14 MED ORDER — OXYCODONE-ACETAMINOPHEN 5-325 MG PO TABS: 1.0000 | ORAL_TABLET | ORAL | 0 refills | Status: DC | PRN

## 2019-08-14 MED ORDER — IOHEXOL 300 MG/ML  SOLN
100.0000 mL | Freq: Once | INTRAMUSCULAR | Status: AC | PRN
  Administered 2019-08-14: 100 mL via INTRAVENOUS

## 2019-08-14 MED ORDER — CLINDAMYCIN HCL 300 MG PO CAPS: 300.0000 mg | ORAL_CAPSULE | Freq: Three times a day (TID) | ORAL | 0 refills | Status: DC

## 2019-08-14 MED ORDER — MORPHINE SULFATE (PF) 4 MG/ML IV SOLN
4.0000 mg | Freq: Once | INTRAVENOUS | Status: AC
  Administered 2019-08-14: 11:00:00 4 mg via INTRAVENOUS
  Filled 2019-08-14: qty 1

## 2019-08-14 NOTE — ED Triage Notes (Signed)
Pt here for abdominal pain. Was seen 2 weeks ago for same. Notified here that she needs to get gallbladder removed, but hasn't followed up for surgery. Also complaining of wisdom tooth pain.

## 2019-08-14 NOTE — ED Notes (Signed)
Pt left with friend driving 

## 2019-08-14 NOTE — ED Provider Notes (Signed)
[facility] EMERGENCY DEPARTMENT Provider Note   CSN: [id number] Arrival date & time: 08/14/19  0906    History   Chief Complaint Chief Complaint  Patient presents with   Abdominal Pain    HPI Mallory RiggsGemini Dolinski is a 23 y.o. female with hx of PUD and cholelithiasis who presents with abdominal pain, dental pain, finger pain.   Abdominal pain - Pt states that she has gallstones and thinks it might be related. She thinks it's the same pain she's been experiencing a month ago when she was told her gallbladder was swollen and she has stones. She was referred to general surgery but has not followed up because it hasn't been bothering her and her mother died and her boyfriend's mother died. The pain has been going on for 2 days. It's 10/10, sharp and cramping. It's over the left side of the abdomen and across the lower abdomen. It's worse with movement and eating. She has been taking Tylenol for pain but it hasn't been helping. She reports associated mild constipation - her LBM was 2 days ago and she's had hard stools, straining, and little bit of blood on the tissue when she wipes. Her LMP started about 2 days ago as well but she states she usually doesn't have cramping when she's on her period. She was last sexually active 3-4 months ago and denies pelvic pain or vaginal discharge. She denies fever, chills, chest pain, SOB, nausea, diarrhea, urinary symptoms. She has had 2 episodes of vomiting but this has not been recurrent. She reports a prior C-section but no other abdominal surgeries.  Additionally she is having dental pain. It's top posterior and lower molars. It's been going on for about a week. She's tried OTC meds for pain. She does not have a dentist.  R index finger - she has had pain for the past 3-4 days in the right index finger between the MCP and PIP joint. This morning she woke up with swelling and has had difficulty moving the finger. She has not had an injury to the area but reports  remote hx of what sounds like a tuft fracture of the right index finger. She does not work with her hands. She is left handed.  HPI  Past Medical History:  Diagnosis Date   Asthma    Chronic back pain    Gonorrhea    Nausea and vomiting during pregnancy 08/14/2016   Supervision of normal pregnancy 08/14/2016    [facility] at   7+2 weeks FOB  malick mcfarland 23 yo bm 2nd Dating By  LMP and US  Pap   NA GC/CT Initial:                36+wks: Genetic Screen NT/IT:  CF screen   Declined  Anatomic US  Flu vaccine  Tdap Recommended ~ 28wks Glucose Screen  2 hr GBS  Feed Preference  Contraception  Circumcision  Childbirth Classes  Pediatrician      Patient Active Problem List   Diagnosis Date Noted   Right flank pain 01/06/2018   Screening examination for STD (sexually transmitted disease) 01/06/2018   Dysuria 01/06/2018   Urinary tract infection with hematuria 01/06/2018   Post term pregnancy at [redacted] weeks gestation 04/09/2017   Gestational hypertension 04/09/2017   Gonorrhea affecting pregnancy in third trimester 03/11/2017   Marijuana use 09/21/2016   Supervision of normal pregnancy 08/14/2016   Nausea and vomiting during pregnancy 08/14/2016    Past Surgical History:  Procedure Laterality Date   CESAREAN SECTION N/A 04/10/2017   Procedure: CESAREAN SECTION;  Surgeon: Jonnie Kind, MD;  Location: [facility];  Service: Obstetrics;  Laterality: N/A;     OB History    Gravida  1   Para  1   Term  1   Preterm      AB      Living  1     SAB      TAB      Ectopic      Multiple  0   Live Births  1            Home Medications    Prior to Admission medications   Medication Sig Start Date End Date Taking? Authorizing Provider  albuterol (VENTOLIN HFA) 108 (90 Base) MCG/ACT inhaler Inhale 1-2 puffs into the lungs every 6 (six) hours as needed for wheezing or shortness of breath.    [provider]  cetirizine  (ZYRTEC) 10 MG tablet Take 10 mg by mouth daily.    [provider]  cyclobenzaprine (FLEXERIL) 10 MG tablet Take 1 tablet (10 mg total) by mouth 2 (two) times daily as needed. 04/08/19   Nat Christen, MD  diclofenac (VOLTAREN) 50 MG EC tablet Take 1 tablet (50 mg total) by mouth 2 (two) times daily. 04/08/19   Nat Christen, MD  HYDROcodone-acetaminophen (NORCO/VICODIN) 5-325 MG tablet Take 1 tablet by mouth every 6 (six) hours as needed for moderate pain. 04/08/19   Lawyer, Harrell Gave, PA-C  ibuprofen (ADVIL) 600 MG tablet Take 1 tablet (600 mg total) by mouth every 6 (six) hours as needed. Patient not taking: Reported on 04/08/2019 02/06/19   Fransico Meadow, PA-C  ondansetron (ZOFRAN ODT) 4 MG disintegrating tablet Take 1 tablet (4 mg total) by mouth every 8 (eight) hours as needed. 07/18/19   Long, Wonda Olds, MD  oxyCODONE-acetaminophen (PERCOCET/ROXICET) 5-325 MG tablet Take 1 tablet by mouth every 6 (six) hours as needed for severe pain. 07/18/19   Long, Wonda Olds, MD    Family History Family History  Problem Relation Age of Onset   Other Mother        degenerative disc   Other Father        heart surgery    Social History Social History   Tobacco Use   Smoking status: Current Every Day Smoker    Packs/day: 0.50    Years: 3.00    Pack years: 1.50   Smokeless tobacco: Never Used  Substance Use Topics   Alcohol use: No   Drug use: No    Types: Marijuana    Comment: last used 08/2017     Allergies   Hydrocodone, Amoxicillin, Ceclor [cefaclor], Red dye, and Ultram [tramadol]   Review of Systems Review of Systems  Constitutional: Negative for chills and fever.  HENT: Positive for dental problem.   Respiratory: Negative for cough and shortness of breath.   Cardiovascular: Negative for chest pain.  Gastrointestinal: Positive for abdominal pain, constipation and vomiting. Negative for nausea.  Genitourinary: Positive for pelvic pain and vaginal bleeding. Negative for  difficulty urinating, dysuria, flank pain, hematuria, menstrual problem and vaginal discharge.  Musculoskeletal: Positive for arthralgias.  Neurological: Negative for headaches.  All other systems reviewed and are negative.    Physical Exam Updated Vital Signs BP 134/85 (BP Location: Right Arm)    Pulse 89    Temp (!) 97.5 F (36.4 C) (Oral)    Resp 14  Ht 5\' 9"  (1.753 m)    Wt 99.8 kg    LMP 08/13/2019    SpO2 100%    BMI 32.49 kg/m   Physical Exam Vitals signs and nursing note reviewed.  Constitutional:      General: She is not in acute distress.    Appearance: She is well-developed. She is not ill-appearing.  HENT:     Head: Normocephalic and atraumatic.     Mouth/Throat:     Lips: Pink.     Mouth: Mucous membranes are moist.     Dentition: Dental caries present.     Pharynx: Oropharynx is clear.  Eyes:     General: No scleral icterus.       Right eye: No discharge.        Left eye: No discharge.     Conjunctiva/sclera: Conjunctivae normal.     Pupils: Pupils are equal, round, and reactive to light.  Neck:     Musculoskeletal: Normal range of motion.  Cardiovascular:     Rate and Rhythm: Normal rate and regular rhythm.  Pulmonary:     Effort: Pulmonary effort is normal. No respiratory distress.     Breath sounds: Normal breath sounds.  Abdominal:     General: There is no distension.     Palpations: Abdomen is soft. There is no mass.     Tenderness: There is abdominal tenderness (LUQ, LLQ, Suprapubic, RLQ). There is no right CVA tenderness, left CVA tenderness, guarding or rebound.     Hernia: No hernia is present.  Genitourinary:    Comments: Refused pelvic Musculoskeletal:     Comments: Right index finger: Mild, diffuse fusiform swelling of the index finger from the MCP to PIP joint. No significant tenderness or redness. Decreased ROM.  Skin:    General: Skin is warm and dry.  Neurological:     Mental Status: She is alert and oriented to person, place, and  time.  Psychiatric:        Behavior: Behavior normal.      ED Treatments / Results  Labs (all labs ordered are listed, but only abnormal results are displayed) Labs Reviewed  CBC WITH DIFFERENTIAL/PLATELET - Abnormal; Notable for the following components:      Result Value   Hemoglobin 8.9 (*)    HCT 31.1 (*)    MCV 76.8 (*)    MCH 22.0 (*)    MCHC 28.6 (*)    RDW 18.9 (*)    All other components within normal limits  COMPREHENSIVE METABOLIC PANEL - Abnormal; Notable for the following components:   Calcium 8.6 (*)    Total Bilirubin 0.2 (*)    All other components within normal limits  LIPASE, BLOOD  URINALYSIS, ROUTINE W REFLEX MICROSCOPIC  I-STAT BETA HCG BLOOD, ED ([facility], [facility], [facility] ONLY)  I-STAT BETA HCG BLOOD, ED ([facility], [facility], [facility] ONLY)    EKG None  Radiology Ct Abdomen Pelvis W Contrast  Result Date: 08/14/2019 CLINICAL DATA:  Generalized abdominal pain EXAM: CT ABDOMEN AND PELVIS WITH CONTRAST TECHNIQUE: Multidetector CT imaging of the abdomen and pelvis was performed using the standard protocol following bolus administration of intravenous contrast. CONTRAST:  08/16/2019 OMNIPAQUE IOHEXOL 300 MG/ML  SOLN COMPARISON:  Abdominal ultrasound 07/18/2019 FINDINGS: Lower chest: No acute abnormality. Hepatobiliary: No focal liver abnormality is seen. Gallbladder is incompletely distended, however appears circumferentially thickened. No hyperdense gallstones. No pericholecystic fluid. No biliary dilatation. Pancreas: Unremarkable. No pancreatic ductal dilatation or surrounding inflammatory changes. Spleen: Normal in size without  focal abnormality. Adrenals/Urinary Tract: Adrenal glands are unremarkable. Kidneys are normal, without renal calculi, focal lesion, or hydronephrosis. Bladder is unremarkable. Stomach/Bowel: Stomach is within normal limits. Air-filled, noninflamed appendix within the right lower quadrant. Mild colonic stool burden. No evidence of bowel wall thickening, distention, or  inflammatory changes. Vascular/Lymphatic: No significant vascular findings are present. No enlarged abdominal or pelvic lymph nodes. Reproductive: Unremarkable uterus. Bilateral adnexal cystic lesions, the largest on the right measuring up to 5.9 cm and largest on the left measuring up to 3.7 cm. Trace free fluid within the cul-de-sac. Other: No intra-abdominal ascites. Musculoskeletal: Sclerotic changes of the bilateral sacroiliac joints, left greater than right. Degenerative changes of the symphysis pubis. No acute osseous findings. IMPRESSION: 1. Gallbladder is incompletely distended, however appears circumferentially thickened. No hyperdense gallstones or pericholecystic fluid is evident. If there is concern for acute cholecystitis, recommend further evaluation with right upper quadrant ultrasound. 2. Bilateral adnexal cystic lesions, the largest on the right measuring up to 5.9 cm, and largest on the left measuring up to 3.7 cm. Ultrasound follow-up in 6-12 weeks is recommended. This recommendation follows ACR consensus guidelines: White Paper of the ACR Incidental Findings Committee II on Adnexal Findings. J Am Coll Radiol 2013:10:675-681. 3. Sclerotic changes of the bilateral sacroiliac joints, left greater than right, may represent sequela of sacroiliitis. 4. Mild colonic stool burden. Electronically Signed   By: Duanne Guess M.D.   On: 08/14/2019 12:40   Dg Finger Index Right  Result Date: 08/14/2019 CLINICAL DATA:  Swollen finger EXAM: RIGHT INDEX FINGER 2+V COMPARISON:  None. FINDINGS: Diffuse soft tissue swelling of the right first digit, greatest at the distal aspect of the proximal phalanx. There is no acute fracture. Joint spaces are preserved. There is no evidence of erosion. No intrinsic osseous lesion. No radiopaque foreign body. IMPRESSION: Right first digit soft tissue swelling without evidence of significant osseous abnormality. Electronically Signed   By: Guadlupe Spanish M.D.   On:  08/14/2019 10:34    Procedures Procedures (including critical care time)  Medications Ordered in ED Medications  ketorolac (TORADOL) 15 MG/ML injection 15 mg (15 mg Intravenous Refused 08/14/19 1232)  morphine 4 MG/ML injection 4 mg (4 mg Intravenous Given 08/14/19 1044)  iohexol (OMNIPAQUE) 300 MG/ML solution 100 mL (100 mLs Intravenous Contrast Given 08/14/19 1220)  HYDROmorphone (DILAUDID) injection 0.5 mg (0.5 mg Intravenous Given 08/14/19 1309)     Initial Impression / Assessment and Plan / ED Course  I have reviewed the triage vital signs and the nursing notes.  Pertinent labs & imaging results that were available during my care of the patient were reviewed by me and considered in my medical decision making (see chart for details).  23 year old female presents with multiple complaints. Her vitals are normal. She is overall well appearing but does appear mildly uncomfortable. Abdomen is soft and minimally tender over the left side and across the lower abdomen. DDx: gastritis/PUD, pancreatitis, biliary (colic, cholecystitis, cholangitis, choledocholithiasis), appy, constipation, PID, torsion, ovarian cyst, menstrual cramps. Pt is refusing a pelvic exam  CBC without leukocytosis. She has anemia which has been drifting down for several years but is stable from last month. CMP and lipase are normal. CT scan shows thickened gallbladder with gallstones without pericholecystic fluid. This was present on her last CT scan in July. With normal LFTs and WBC and no RUQ tenderness I doubt cholecystitis although she is convinced she needs her gallbladder out. She was given a referral to general surgery and  can follow up regarding this. CT scan also comments on bilateral ovarian cysts. I doubt torsion since her pain is minimal on exam and she is calm and comfortable appearing. She also has mild constipation on her CT scan so pain is likely multifactorial. Will give her short course of pain meds since she  cannot tolerated NSAIDs and have her f/u with general surgery and OBGYN. She has not given a UA but she is not having urinary symptoms and I don't think we need to delay discharge for this.  Finger pain - She has non-specific swelling of the finger. No signs of inflammation or infection. No trauma. Unclear etiology. Xray is negative. She is requesting finger splint which was given. Advised conservative management at this time.   Dental pain - Will try Clindamycin. She was given dental f/u.  Final Clinical Impressions(s) / ED Diagnoses   Final diagnoses:  Generalized abdominal pain  Cysts of both ovaries  Gallstones  Constipation, unspecified constipation type  Anemia, unspecified type  Pain of finger of right hand  Pain, dental    ED Discharge Orders    None       Bethel Born, PA-C 08/14/19 1339    Vanetta Mulders, MD 08/16/19 0800

## 2019-08-14 NOTE — Discharge Instructions (Signed)
Take Percocet for severe pain. Please use a stool softener to help with constipation Take Tylenol for mild-moderate pain Use a heating pad on the pelvic area  Take Penicillin 4 times daily for one week Follow up with general surgery for your gallbladder Follow up with Dr. Mariel Sleet for your teeth Follow up with OBGYN in 6-12 weeks for recheck on the ovarian cysts to make sure they are going away Return if worsening

## 2019-08-14 NOTE — ED Notes (Signed)
Pt to CT

## 2019-08-14 NOTE — ED Notes (Signed)
Pt is refusing Toradol

## 2019-08-24 ENCOUNTER — Ambulatory Visit: Payer: Self-pay | Admitting: Obstetrics & Gynecology

## 2019-10-03 ENCOUNTER — Ambulatory Visit: Payer: Self-pay | Admitting: Obstetrics & Gynecology

## 2019-10-09 ENCOUNTER — Ambulatory Visit (INDEPENDENT_AMBULATORY_CARE_PROVIDER_SITE_OTHER): Payer: Self-pay | Admitting: Obstetrics and Gynecology

## 2019-10-09 ENCOUNTER — Other Ambulatory Visit: Payer: Self-pay

## 2019-10-09 ENCOUNTER — Encounter: Payer: Self-pay | Admitting: Obstetrics and Gynecology

## 2019-10-09 VITALS — BP 125/78 | HR 80 | Ht 69.0 in | Wt 244.6 lb

## 2019-10-09 DIAGNOSIS — N83201 Unspecified ovarian cyst, right side: Secondary | ICD-10-CM

## 2019-10-09 DIAGNOSIS — N83202 Unspecified ovarian cyst, left side: Secondary | ICD-10-CM

## 2019-10-09 NOTE — Progress Notes (Signed)
Patient ID: Mallory Willis, female   DOB: 03-26-1996, 24 y.o.   MRN: [id number]   [facility] Visit  @DATE @            Patient name: Mallory Willis MRN [id number]  Date of birth: 23-Aug-1996  CC & HPI:  Janilah Hojnacki is a 24 y.o. female presenting today for menorrhagia with ovarian cyst shown on CT of abdomen and pelvis. 5.9 cm cystic lesion on the right ovary. She had been bleeding everyday since 18th of December and clots. Bleeding stopped yesterday. She is not on any birth control. She normally has regular periods. She is not having any abdominal symptoms at this time.   ROS:  ROS +bilateral ovarian cyst +menorrhagia -anemia -fever -chills  All systems are negative except as noted in the HPI and PMH.   Pertinent History Reviewed:   Reviewed: Significant for  Medical         Past Medical History:  Diagnosis Date  . Asthma   . Chronic back pain   . Gonorrhea   . Nausea and vomiting during pregnancy 08/14/2016  . Supervision of normal pregnancy 08/14/2016    [facility] at   7+2 weeks FOB  Mallory Willis 24 yo bm 2nd Dating By  LMP and [place]  Pap   NA GC/CT Initial:                36+wks: Genetic Screen NT/IT:  CF screen   Declined  Anatomic [place]  Flu vaccine  Tdap Recommended ~ 28wks Glucose Screen  2 hr GBS  Feed Preference  Contraception  Circumcision  Childbirth Classes  Pediatrician                                Surgical Hx:    Past Surgical History:  Procedure Laterality Date  . CESAREAN SECTION N/A 04/10/2017   Procedure: CESAREAN SECTION;  Surgeon: Jonnie Kind, MD;  Location: [facility];  Service: Obstetrics;  Laterality: N/A;   Medications: Reviewed & Updated - see associated section                       Current Outpatient Medications:  .  albuterol (VENTOLIN HFA) 108 (90 Base) MCG/ACT inhaler, Inhale 1-2 puffs into the lungs every 6 (six) hours as needed for wheezing or shortness of breath., Disp: , Rfl:  .  cetirizine (ZYRTEC) 10  MG tablet, Take 10 mg by mouth daily., Disp: , Rfl:  .  clindamycin (CLEOCIN) 300 MG capsule, Take 1 capsule (300 mg total) by mouth 3 (three) times daily., Disp: 30 capsule, Rfl: 0 .  cyclobenzaprine (FLEXERIL) 10 MG tablet, Take 1 tablet (10 mg total) by mouth 2 (two) times daily as needed., Disp: 15 tablet, Rfl: 0 .  diclofenac (VOLTAREN) 50 MG EC tablet, Take 1 tablet (50 mg total) by mouth 2 (two) times daily., Disp: 15 tablet, Rfl: 0 .  HYDROcodone-acetaminophen (NORCO/VICODIN) 5-325 MG tablet, Take 1 tablet by mouth every 6 (six) hours as needed for moderate pain., Disp: 15 tablet, Rfl: 0 .  ondansetron (ZOFRAN ODT) 4 MG disintegrating tablet, Take 1 tablet (4 mg total) by mouth every 8 (eight) hours as needed., Disp: 20 tablet, Rfl: 0 .  oxyCODONE-acetaminophen (PERCOCET/ROXICET) 5-325 MG tablet, Take 1 tablet by mouth every 4 (four) hours as needed for severe pain., Disp: 10 tablet, Rfl: 0   Social History:  Reviewed -  reports that she has been smoking. She has a 1.50 pack-year smoking history. She has never used smokeless tobacco.  Objective Findings:  Vitals: There were no vitals taken for this visit.  PHYSICAL EXAMINATION General appearance - alert, well appearing, and in no distress Mental status - alert, oriented to person, place, and time, normal mood, behavior, speech, dress, motor activity, and thought processes, affect appropriate to mood  PELVIC Deferred, discussion only  Assessment & Plan:   A:  1. Bilateral ovarian cysts 2. Menorrhagia  P:  1. TV u/s x 4 weeks 2. Review u/s w/ MD   By signing my name below, I, Arnette Norris, attest that this documentation has been prepared under the direction and in the presence of Tilda Burrow, MD. Electronically Signed: Arnette Norris Medical Scribe. 10/09/19. 11:47 AM.  I personally performed the services described in this documentation, which was SCRIBED in my presence. The recorded information has been reviewed and  considered accurate. It has been edited as necessary during review. Tilda Burrow, MD

## 2019-10-16 ENCOUNTER — Telehealth: Payer: Self-pay | Admitting: *Deleted

## 2019-10-16 NOTE — Telephone Encounter (Signed)
Pt saw Dr. Emelda Fear last week. Pt started bleeding again 3 days ago, on Friday. Dr. Emelda Fear stated pt should start back bleeding in 4 weeks, but she has already started bleeding. Wanted dr to know. Please advise. Thanks!! JSY

## 2019-10-16 NOTE — Telephone Encounter (Signed)
Patient left message stating she was seen by Dr Emelda Fear on 12/18 for bleeding which actually stopped the day before her visit.  She has since started bleeding heavy again and says it is "not her period".  Please call.

## 2019-10-17 ENCOUNTER — Telehealth: Payer: Self-pay | Admitting: Obstetrics and Gynecology

## 2019-10-17 MED ORDER — MEGESTROL ACETATE 40 MG PO TABS: 40.0000 mg | ORAL_TABLET | Freq: Three times a day (TID) | ORAL | 2 refills | Status: DC

## 2019-10-17 NOTE — Telephone Encounter (Signed)
Pt with continued AUB, only stopped briefly during the time of last visit. Pt not sexually active in long time. Pt to take megace tid til controlled then daily to complete meds.

## 2019-11-01 ENCOUNTER — Other Ambulatory Visit: Payer: Self-pay | Admitting: Obstetrics and Gynecology

## 2019-11-01 DIAGNOSIS — N939 Abnormal uterine and vaginal bleeding, unspecified: Secondary | ICD-10-CM

## 2019-11-01 DIAGNOSIS — N83201 Unspecified ovarian cyst, right side: Secondary | ICD-10-CM

## 2019-11-06 ENCOUNTER — Other Ambulatory Visit: Payer: Self-pay

## 2019-11-13 ENCOUNTER — Other Ambulatory Visit: Payer: Self-pay

## 2019-11-13 ENCOUNTER — Ambulatory Visit: Payer: Self-pay

## 2019-11-20 ENCOUNTER — Other Ambulatory Visit: Payer: Self-pay

## 2019-11-30 ENCOUNTER — Other Ambulatory Visit: Payer: Self-pay

## 2019-12-06 ENCOUNTER — Other Ambulatory Visit: Payer: Self-pay

## 2020-03-19 ENCOUNTER — Encounter (HOSPITAL_COMMUNITY): Payer: Self-pay | Admitting: Emergency Medicine

## 2020-03-19 ENCOUNTER — Other Ambulatory Visit: Payer: Self-pay

## 2020-03-19 ENCOUNTER — Emergency Department (HOSPITAL_COMMUNITY)
Admission: EM | Admit: 2020-03-19 | Discharge: 2020-03-19 | Disposition: A | Discharge status: Home / Self Care | Payer: Self-pay | Attending: Emergency Medicine | Admitting: Emergency Medicine

## 2020-03-19 DIAGNOSIS — J029 Acute pharyngitis, unspecified: Secondary | ICD-10-CM | POA: Insufficient documentation

## 2020-03-19 DIAGNOSIS — F17210 Nicotine dependence, cigarettes, uncomplicated: Secondary | ICD-10-CM | POA: Insufficient documentation

## 2020-03-19 DIAGNOSIS — N939 Abnormal uterine and vaginal bleeding, unspecified: Secondary | ICD-10-CM | POA: Insufficient documentation

## 2020-03-19 DIAGNOSIS — R1011 Right upper quadrant pain: Secondary | ICD-10-CM | POA: Insufficient documentation

## 2020-03-19 DIAGNOSIS — J45909 Unspecified asthma, uncomplicated: Secondary | ICD-10-CM | POA: Insufficient documentation

## 2020-03-19 LAB — COMPREHENSIVE METABOLIC PANEL
ALT: 10 U/L (ref 0–44)
AST: 17 U/L (ref 15–41)
Albumin: 3.6 g/dL (ref 3.5–5.0)
Alkaline Phosphatase: 66 U/L (ref 38–126)
Anion gap: 7 (ref 5–15)
BUN: 10 mg/dL (ref 6–20)
CO2: 26 mmol/L (ref 22–32)
Calcium: 8.6 mg/dL — ABNORMAL LOW (ref 8.9–10.3)
Chloride: 104 mmol/L (ref 98–111)
Creatinine, Ser: 0.71 mg/dL (ref 0.44–1.00)
GFR calc Af Amer: >60 mL/min (ref >60)
GFR calc non Af Amer: >60 mL/min (ref >60)
Glucose, Bld: 107 mg/dL — ABNORMAL HIGH (ref 70–99)
Potassium: 3.6 mmol/L (ref 3.5–5.1)
Sodium: 137 mmol/L (ref 135–145)
Total Bilirubin: 0.2 mg/dL — ABNORMAL LOW (ref 0.3–1.2)
Total Protein: 6.9 g/dL (ref 6.5–8.1)

## 2020-03-19 LAB — CBC WITH DIFFERENTIAL/PLATELET
Abs Immature Granulocytes: 0.02 10*3/uL (ref 0.00–0.07)
Basophils Absolute: 0 10*3/uL (ref 0.0–0.1)
Basophils Relative: 0 %
Eosinophils Absolute: 0.2 10*3/uL (ref 0.0–0.5)
Eosinophils Relative: 2 %
HCT: 29.6 % — ABNORMAL LOW (ref 36.0–46.0)
Hemoglobin: 8.5 g/dL — ABNORMAL LOW (ref 12.0–15.0)
Immature Granulocytes: 0 %
Lymphocytes Relative: 46 %
Lymphs Abs: 4.1 10*3/uL — ABNORMAL HIGH (ref 0.7–4.0)
MCH: 21.1 pg — ABNORMAL LOW (ref 26.0–34.0)
MCHC: 28.7 g/dL — ABNORMAL LOW (ref 30.0–36.0)
MCV: 73.6 fL — ABNORMAL LOW (ref 80.0–100.0)
Monocytes Absolute: 0.6 10*3/uL (ref 0.1–1.0)
Monocytes Relative: 7 %
Neutro Abs: 4 10*3/uL (ref 1.7–7.7)
Neutrophils Relative %: 45 %
Platelets: 400 10*3/uL (ref 150–400)
RBC: 4.02 MIL/uL (ref 3.87–5.11)
RDW: 19.9 % — ABNORMAL HIGH (ref 11.5–15.5)
WBC: 9 10*3/uL (ref 4.0–10.5)
nRBC: 0 % (ref 0.0–0.2)

## 2020-03-19 LAB — LIPASE, BLOOD: Lipase: 86 U/L — ABNORMAL HIGH (ref 11–51)

## 2020-03-19 LAB — GROUP A STREP BY PCR: Group A Strep by PCR: NOT DETECTED

## 2020-03-19 MED ORDER — OXYCODONE-ACETAMINOPHEN 5-325 MG PO TABS: 1.0000 | ORAL_TABLET | Freq: Four times a day (QID) | ORAL | 0 refills | Status: DC | PRN

## 2020-03-19 MED ORDER — ONDANSETRON HCL 4 MG/2ML IJ SOLN
4.0000 mg | Freq: Once | INTRAMUSCULAR | Status: AC
  Administered 2020-03-19: 4 mg via INTRAVENOUS
  Filled 2020-03-19: qty 2

## 2020-03-19 MED ORDER — ONDANSETRON HCL 4 MG PO TABS: 4.0000 mg | ORAL_TABLET | Freq: Three times a day (TID) | ORAL | 0 refills | Status: DC | PRN

## 2020-03-19 MED ORDER — SODIUM CHLORIDE 0.9 % IV BOLUS
1000.0000 mL | Freq: Once | INTRAVENOUS | Status: AC
  Administered 2020-03-19: 1000 mL via INTRAVENOUS

## 2020-03-19 NOTE — ED Notes (Signed)
Pt refused standing orders of blood work. Pt reports"i dont feel like getting poked on today." pt agreed to urine sample.

## 2020-03-19 NOTE — ED Notes (Signed)
[place] not here at this time

## 2020-03-19 NOTE — ED Notes (Addendum)
Pt refusing IV, labs, and fluids  Says she will give urine sample when she feels like she needs to go to restroom

## 2020-03-19 NOTE — ED Provider Notes (Signed)
[facility] EMERGENCY DEPARTMENT Provider Note   CSN: [id number] Arrival date & time: 03/19/20  1505     History Chief Complaint  Patient presents with  . Abdominal Pain    Mallory Willis is a 24 y.o. female.  She is complaining of a sore throat that is been going on for 3 days.  Not associate with any fever.  She is also had a couple of days worth of abdominal pain, upper abdomen and lower abdomen.  She is currently finishing her period now.  She said she was told in the past that she has gallstones and that she needs to get them taken out.  Also said she has had ovarian cysts.  No vaginal discharge.  No vomiting.  Has tried nothing for it.  Does not have a primary care doctor.  The history is provided by the patient.  Abdominal Pain Pain location:  Epigastric and suprapubic Pain quality: cramping   Pain radiates to:  Does not radiate Pain severity:  Moderate Onset quality:  Gradual Timing:  Intermittent Progression:  Unchanged Chronicity:  Recurrent Context: not trauma   Relieved by:  None tried Worsened by:  Nothing Ineffective treatments:  None tried Associated symptoms: sore throat and vaginal bleeding   Associated symptoms: no chest pain, no cough, no fever, no shortness of breath, no vaginal discharge and no vomiting        Past Medical History:  Diagnosis Date  . Asthma   . Chronic back pain   . Gonorrhea   . Nausea and vomiting during pregnancy 08/14/2016  . Supervision of normal pregnancy 08/14/2016    [facility] at   7+2 weeks FOB  malick mcfarland 24 yo bm 2nd Dating By  LMP and [place]  Pap   NA GC/CT Initial:                36+wks: Genetic Screen NT/IT:  CF screen   Declined  Anatomic [place]  Flu vaccine  Tdap Recommended ~ 28wks Glucose Screen  2 hr GBS  Feed Preference  Contraception  Circumcision  Childbirth Classes  Pediatrician      Patient Active Problem List   Diagnosis Date Noted  . Right flank pain 01/06/2018  . Screening examination  for STD (sexually transmitted disease) 01/06/2018  . Dysuria 01/06/2018  . Urinary tract infection with hematuria 01/06/2018  . Post term pregnancy at [redacted] weeks gestation 04/09/2017  . Gestational hypertension 04/09/2017  . Gonorrhea affecting pregnancy in third trimester 03/11/2017  . Marijuana use 09/21/2016  . Supervision of normal pregnancy 08/14/2016  . Nausea and vomiting during pregnancy 08/14/2016    Past Surgical History:  Procedure Laterality Date  . CESAREAN SECTION N/A 04/10/2017   Procedure: CESAREAN SECTION;  Surgeon: Tilda Burrow, MD;  Location: [facility] BIRTHING SUITES;  Service: Obstetrics;  Laterality: N/A;     OB History    Gravida  1   Para  1   Term  1   Preterm      AB      Living  1     SAB      TAB      Ectopic      Multiple  0   Live Births  1           Family History  Problem Relation Age of Onset  . Other Mother        degenerative disc  . Other Father  heart surgery    Social History   Tobacco Use  . Smoking status: Current Every Day Smoker    Packs/day: 0.50    Years: 3.00    Pack years: 1.50    Types: Cigarettes  . Smokeless tobacco: Never Used  Vaping Use  . Vaping Use: Never used  Substance Use Topics  . Alcohol use: No  . Drug use: No    Types: Marijuana    Comment: last used 08/2017    Home Medications Prior to Admission medications   Medication Sig Start Date End Date Taking? Authorizing Provider  albuterol (VENTOLIN HFA) 108 (90 Base) MCG/ACT inhaler Inhale 1-2 puffs into the lungs every 6 (six) hours as needed for wheezing or shortness of breath.    [provider]  cetirizine (ZYRTEC) 10 MG tablet Take 10 mg by mouth as needed.     [provider]  megestrol (MEGACE) 40 MG tablet Take 1 tablet (40 mg total) by mouth 3 (three) times daily. Til bleeding stops then once daily til completed. 10/17/19   Tilda Burrow, MD    Allergies    Hydrocodone, Amoxicillin, Ceclor [cefaclor],  Nsaids, Red dye, and Ultram [tramadol]  Review of Systems   Review of Systems  Constitutional: Negative for fever.  HENT: Positive for sore throat.   Eyes: Negative for visual disturbance.  Respiratory: Negative for cough and shortness of breath.   Cardiovascular: Negative for chest pain.  Gastrointestinal: Positive for abdominal pain. Negative for vomiting.  Genitourinary: Positive for vaginal bleeding. Negative for vaginal discharge.  Musculoskeletal: Negative for neck pain.  Skin: Negative for rash.  Neurological: Negative for headaches.    Physical Exam Updated Vital Signs BP 103/70 (BP Location: Right Arm)   Pulse 67   Temp 98.4 F (36.9 C) (Oral)   Resp 18   Ht 5\' 9"  (1.753 m)   Wt 104.3 kg   LMP 03/19/2020   SpO2 100%   BMI 33.97 kg/m   Physical Exam Vitals and nursing note reviewed.  Constitutional:      General: She is not in acute distress.    Appearance: She is well-developed.  HENT:     Head: Normocephalic and atraumatic.     Mouth/Throat:     Mouth: Mucous membranes are moist.     Pharynx: No pharyngeal swelling or oropharyngeal exudate.  Eyes:     Conjunctiva/sclera: Conjunctivae normal.  Cardiovascular:     Rate and Rhythm: Normal rate and regular rhythm.     Heart sounds: No murmur heard.   Pulmonary:     Effort: Pulmonary effort is normal. No respiratory distress.     Breath sounds: Normal breath sounds. No stridor. No wheezing.  Abdominal:     Palpations: Abdomen is soft.     Tenderness: There is no abdominal tenderness. There is no guarding or rebound.  Musculoskeletal:        General: No tenderness. Normal range of motion.     Cervical back: Neck supple.  Skin:    General: Skin is warm and dry.  Neurological:     General: No focal deficit present.     Mental Status: She is alert.     GCS: GCS eye subscore is 4. GCS verbal subscore is 5. GCS motor subscore is 6.     ED Results / Procedures / Treatments   Labs (all labs ordered are  listed, but only abnormal results are displayed) Labs Reviewed  COMPREHENSIVE METABOLIC PANEL - Abnormal; Notable for the  following components:      Result Value   Glucose, Bld 107 (*)    Calcium 8.6 (*)    Total Bilirubin 0.2 (*)    All other components within normal limits  CBC WITH DIFFERENTIAL/PLATELET - Abnormal; Notable for the following components:   Hemoglobin 8.5 (*)    HCT 29.6 (*)    MCV 73.6 (*)    MCH 21.1 (*)    MCHC 28.7 (*)    RDW 19.9 (*)    Lymphs Abs 4.1 (*)    All other components within normal limits  LIPASE, BLOOD - Abnormal; Notable for the following components:   Lipase 86 (*)    All other components within normal limits  GROUP A STREP BY PCR    EKG None  Radiology No results found.  Procedures Procedures (including critical care time)  Medications Ordered in ED Medications  sodium chloride 0.9 % bolus 1,000 mL (0 mLs Intravenous Stopped 03/19/20 2059)  ondansetron (ZOFRAN) injection 4 mg (4 mg Intravenous Given 03/19/20 2018)    ED Course  I have reviewed the triage vital signs and the nursing notes.  Pertinent labs & imaging results that were available during my care of the patient were reviewed by me and considered in my medical decision making (see chart for details).  Clinical Course as of Mar 20 936  Tue Mar 19, 2020  2036 Patient states she does not want to give a urine sample and has been here too long and wants to be discharged.  She is asking for some medication to go home with.  Recommended that she had a primary care doctor and also gave her the number for our general surgeon.   [MB]    Clinical Course User Index [MB] Terrilee Files, MD   MDM Rules/Calculators/A&P                         This patient complains of sore throat and upper and lower abdominal pain; this involves an extensive number of treatment Options and is a complaint that carries with it a high risk of complications and Morbidity. The differential includes  strep throat, viral syndrome, cholelithiasis, cholecystitis, peptic ulcer disease, urinary tract infection, obstruction  I ordered, reviewed and interpreted labs, which included CBC with normal white count, low hemoglobin consistent with baseline, chemistries normal, LFTs normal, lipase slightly elevated I ordered medication IV fluids, Zofran Previous records obtained and reviewed in epic, radiology studies comment upon a thickened gallbladder  After the interventions stated above, I reevaluated the patient and found patient to be hemodynamically stable. Her exam is benign. She states she doesn't want any further testing right now and asked to be discharged.   Final Clinical Impression(s) / ED Diagnoses Final diagnoses:  RUQ pain  Sore throat    Rx / DC Orders ED Discharge Orders         Ordered    ondansetron (ZOFRAN) 4 MG tablet  Every 8 hours PRN     Discontinue  Reprint     03/19/20 2035    oxyCODONE-acetaminophen (PERCOCET/ROXICET) 5-325 MG tablet  Every 6 hours PRN     Discontinue  Reprint     03/19/20 2035           Terrilee Files, MD 03/20/20 [phone]

## 2020-03-19 NOTE — ED Triage Notes (Signed)
Pt reports sore throat and intermittent abd pain since yesterday. Pt denies any n/v/d/fever. Pt reports LMP ending today.

## 2020-03-19 NOTE — ED Notes (Signed)
Urine sample clicked off but not provided by pt. Pt states that she does not need to use the restroom. Advised that we are waiting on urine and urine preg test. States she wants to just go home and be in pain

## 2020-03-19 NOTE — Discharge Instructions (Addendum)
You were seen in the emergency department for abdominal pain and sore throat.  Your strep test was negative.  Your liver numbers were normal although your lipase was mildly elevated.  You asked to be discharged so we are sending you out with a prescription for nausea and pain medicine.  Please contact Dr. Henreitta Leber office in general surgery.  Return to the emergency department for any worsening or concerning symptoms

## 2020-04-30 ENCOUNTER — Encounter (HOSPITAL_COMMUNITY): Payer: Self-pay

## 2020-04-30 ENCOUNTER — Other Ambulatory Visit: Payer: Self-pay

## 2020-04-30 ENCOUNTER — Emergency Department (HOSPITAL_COMMUNITY)
Admission: EM | Admit: 2020-04-30 | Discharge: 2020-05-01 | Disposition: A | Discharge status: Home / Self Care | Payer: Self-pay | Attending: Emergency Medicine | Admitting: Emergency Medicine

## 2020-04-30 DIAGNOSIS — J029 Acute pharyngitis, unspecified: Secondary | ICD-10-CM | POA: Insufficient documentation

## 2020-04-30 DIAGNOSIS — J45909 Unspecified asthma, uncomplicated: Secondary | ICD-10-CM | POA: Insufficient documentation

## 2020-04-30 DIAGNOSIS — G8929 Other chronic pain: Secondary | ICD-10-CM | POA: Insufficient documentation

## 2020-04-30 DIAGNOSIS — M549 Dorsalgia, unspecified: Secondary | ICD-10-CM | POA: Insufficient documentation

## 2020-04-30 DIAGNOSIS — R52 Pain, unspecified: Secondary | ICD-10-CM

## 2020-04-30 DIAGNOSIS — Z20822 Contact with and (suspected) exposure to covid-19: Secondary | ICD-10-CM | POA: Insufficient documentation

## 2020-04-30 DIAGNOSIS — R059 Cough, unspecified: Secondary | ICD-10-CM

## 2020-04-30 DIAGNOSIS — M791 Myalgia, unspecified site: Secondary | ICD-10-CM | POA: Insufficient documentation

## 2020-04-30 DIAGNOSIS — Z79899 Other long term (current) drug therapy: Secondary | ICD-10-CM | POA: Insufficient documentation

## 2020-04-30 DIAGNOSIS — R05 Cough: Secondary | ICD-10-CM | POA: Insufficient documentation

## 2020-04-30 DIAGNOSIS — F17210 Nicotine dependence, cigarettes, uncomplicated: Secondary | ICD-10-CM | POA: Insufficient documentation

## 2020-04-30 LAB — GROUP A STREP BY PCR: Group A Strep by PCR: NOT DETECTED

## 2020-04-30 LAB — SARS CORONAVIRUS 2 BY RT PCR (HOSPITAL ORDER, PERFORMED IN ~~LOC~~ HOSPITAL LAB): SARS Coronavirus 2: NEGATIVE

## 2020-04-30 MED ORDER — KETOROLAC TROMETHAMINE 60 MG/2ML IM SOLN
30.0000 mg | Freq: Once | INTRAMUSCULAR | Status: AC
  Administered 2020-04-30: 30 mg via INTRAMUSCULAR
  Filled 2020-04-30: qty 2

## 2020-04-30 MED ORDER — BENZONATATE 100 MG PO CAPS: 100.0000 mg | ORAL_CAPSULE | Freq: Three times a day (TID) | ORAL | 0 refills | Status: DC

## 2020-04-30 NOTE — ED Provider Notes (Signed)
[facility] EMERGENCY DEPARTMENT Provider Note   CSN: [id number] Arrival date & time: 04/30/20  1834     History Chief Complaint  Patient presents with  . Back Pain  . Sore Throat    Zaineb Nowaczyk is a 24 y.o. female.  HPI      Lavell Ridings is a 24 y.o. female, with a history of asthma, chronic back pain, presenting to the ED with sore throat, cough, body aches for the last 2 days. No Covid vaccination. She notes some aching in the back and abdomen as well as the extremities. LMP August 1. Denies known fever, shortness of breath, chest pain, N/V/D, urinary symptoms, dizziness, syncope, or any other complaints.    Past Medical History:  Diagnosis Date  . Asthma   . Chronic back pain   . Gonorrhea   . Nausea and vomiting during pregnancy 08/14/2016  . Supervision of normal pregnancy 08/14/2016    [facility] at   7+2 weeks FOB  malick mcfarland 24 yo bm 2nd Dating By  LMP and [place]  Pap   NA GC/CT Initial:                36+wks: Genetic Screen NT/IT:  CF screen   Declined  Anatomic [place]  Flu vaccine  Tdap Recommended ~ 28wks Glucose Screen  2 hr GBS  Feed Preference  Contraception  Circumcision  Childbirth Classes  Pediatrician      Patient Active Problem List   Diagnosis Date Noted  . Right flank pain 01/06/2018  . Screening examination for STD (sexually transmitted disease) 01/06/2018  . Dysuria 01/06/2018  . Urinary tract infection with hematuria 01/06/2018  . Post term pregnancy at [redacted] weeks gestation 04/09/2017  . Gestational hypertension 04/09/2017  . Gonorrhea affecting pregnancy in third trimester 03/11/2017  . Marijuana use 09/21/2016  . Supervision of normal pregnancy 08/14/2016  . Nausea and vomiting during pregnancy 08/14/2016    Past Surgical History:  Procedure Laterality Date  . CESAREAN SECTION N/A 04/10/2017   Procedure: CESAREAN SECTION;  Surgeon: Tilda Burrow, MD;  Location: [facility] BIRTHING SUITES;  Service: Obstetrics;  Laterality:  N/A;     OB History    Gravida  1   Para  1   Term  1   Preterm      AB      Living  1     SAB      TAB      Ectopic      Multiple  0   Live Births  1           Family History  Problem Relation Age of Onset  . Other Mother        degenerative disc  . Other Father        heart surgery    Social History   Tobacco Use  . Smoking status: Current Every Day Smoker    Packs/day: 0.50    Years: 3.00    Pack years: 1.50    Types: Cigarettes  . Smokeless tobacco: Never Used  Vaping Use  . Vaping Use: Never used  Substance Use Topics  . Alcohol use: No  . Drug use: No    Types: Marijuana    Comment: last used 08/2017    Home Medications Prior to Admission medications   Medication Sig Start Date End Date Taking? Authorizing Provider  albuterol (VENTOLIN HFA) 108 (90 Base) MCG/ACT inhaler Inhale 1-2 puffs into the lungs every  6 (six) hours as needed for wheezing or shortness of breath.    [provider]  benzonatate (TESSALON) 100 MG capsule Take 1 capsule (100 mg total) by mouth every 8 (eight) hours. 04/30/20   Reema Chick C, PA-C  cetirizine (ZYRTEC) 10 MG tablet Take 10 mg by mouth as needed.     [provider]  megestrol (MEGACE) 40 MG tablet Take 1 tablet (40 mg total) by mouth 3 (three) times daily. Til bleeding stops then once daily til completed. 10/17/19   Tilda Burrow, MD  ondansetron (ZOFRAN) 4 MG tablet Take 1 tablet (4 mg total) by mouth every 8 (eight) hours as needed for nausea or vomiting. 03/19/20   Terrilee Files, MD  oxyCODONE-acetaminophen (PERCOCET/ROXICET) 5-325 MG tablet Take 1-2 tablets by mouth every 6 (six) hours as needed for severe pain. 03/19/20   Terrilee Files, MD    Allergies    Hydrocodone, Amoxicillin, Ceclor [cefaclor], Nsaids, Red dye, and Ultram [tramadol]  Review of Systems   Review of Systems  Constitutional: Negative for chills and fever.  HENT: Positive for sore throat.   Respiratory:  Positive for cough. Negative for shortness of breath.   Cardiovascular: Negative for chest pain and leg swelling.  Gastrointestinal: Negative for diarrhea, nausea and vomiting.  Musculoskeletal: Positive for myalgias.  Neurological: Negative for dizziness and syncope.  All other systems reviewed and are negative.   Physical Exam Updated Vital Signs BP 134/68 (BP Location: Right Arm)   Pulse 78   Temp 99.5 F (37.5 C) (Oral)   Resp 18   Ht 5\' 9"  (1.753 m)   LMP 04/14/2020   SpO2 100%   BMI 33.97 kg/m   Physical Exam Vitals and nursing note reviewed.  Constitutional:      General: She is not in acute distress.    Appearance: She is well-developed. She is not diaphoretic.  HENT:     Head: Normocephalic and atraumatic.     Mouth/Throat:     Mouth: Mucous membranes are moist.     Pharynx: Oropharynx is clear.  Eyes:     Conjunctiva/sclera: Conjunctivae normal.  Cardiovascular:     Rate and Rhythm: Normal rate and regular rhythm.     Pulses: Normal pulses.          Radial pulses are 2+ on the right side and 2+ on the left side.       Posterior tibial pulses are 2+ on the right side and 2+ on the left side.     Heart sounds: Normal heart sounds.     Comments: Tactile temperature in the extremities appropriate and equal bilaterally. Pulmonary:     Effort: Pulmonary effort is normal. No respiratory distress.     Breath sounds: Normal breath sounds.  Abdominal:     Palpations: Abdomen is soft.     Tenderness: There is no abdominal tenderness. There is no guarding.  Musculoskeletal:     Cervical back: Neck supple.     Right lower leg: No edema.     Left lower leg: No edema.     Comments: Seems to have tenderness throughout the musculature, including of the back.  Lymphadenopathy:     Cervical: No cervical adenopathy.  Skin:    General: Skin is warm and dry.  Neurological:     Mental Status: She is alert.  Psychiatric:        Mood and Affect: Mood and affect normal.  Speech: Speech normal.        Behavior: Behavior normal.     ED Results / Procedures / Treatments   Labs (all labs ordered are listed, but only abnormal results are displayed) Labs Reviewed  GROUP A STREP BY PCR  SARS CORONAVIRUS 2 BY RT PCR (HOSPITAL ORDER, PERFORMED IN [place] [facility])    EKG None  Radiology No results found.  Procedures Procedures (including critical care time)  Medications Ordered in ED Medications  ketorolac (TORADOL) injection 30 mg (30 mg Intramuscular Given 04/30/20 2339)    ED Course  I have reviewed the triage vital signs and the nursing notes.  Pertinent labs & imaging results that were available during my care of the patient were reviewed by me and considered in my medical decision making (see chart for details).    MDM Rules/Calculators/A&P                          Patient presents with body aches, sore throat, cough. Patient is nontoxic appearing, afebrile, not tachycardic, not tachypneic, not hypotensive, maintains excellent SPO2 on room air, and is in no apparent distress.  Strep test negative.  Covid test pending at time of discharge.  Isolation discussed. The patient was given instructions for home care as well as return precautions. Patient voices understanding of these instructions, accepts the plan, and is comfortable with discharge.    Final Clinical Impression(s) / ED Diagnoses Final diagnoses:  Sore throat  Cough  Body aches  Person under investigation for COVID-19    Rx / DC Orders ED Discharge Orders         Ordered    benzonatate (TESSALON) 100 MG capsule  Every 8 hours     Discontinue  Reprint     04/30/20 2328           Anselm Pancoast, PA-C 05/01/20 1955    Maia Plan, MD 05/06/20 [phone]

## 2020-04-30 NOTE — Discharge Instructions (Addendum)
General Viral Syndrome Care Instructions:  Your symptoms are likely consistent with a viral illness, such as COVID-19. Viruses do not require or respond to antibiotics. Treatment is symptomatic care and it is important to note that these symptoms may last for 7-14 days.   Hand washing: Wash your hands throughout the day, but especially before and after touching the face, using the restroom, sneezing, coughing, or touching surfaces that have been coughed or sneezed upon. Hydration: Symptoms of most illnesses will be intensified and complicated by dehydration. Dehydration can also extend the duration of symptoms. Drink plenty of fluids and get plenty of rest. You should be drinking at least half a liter of water an hour to stay hydrated. Electrolyte drinks (ex. Gatorade, Powerade, Pedialyte) are also encouraged. You should be drinking enough fluids to make your urine light yellow, almost clear. If this is not the case, you are not drinking enough water. Please note that some of the treatments indicated below will not be effective if you are not adequately hydrated. Diet: Please concentrate on hydration, however, you may introduce food slowly.  Start with a clear liquid diet, progressed to a full liquid diet, and then bland solids as you are able. Pain or fever: Acetaminophen (generic for Tylenol) for pain or fever.  Acetaminophen: May take acetaminophen (generic for Tylenol), as needed, for pain. Your daily total maximum amount of acetaminophen from all sources should be limited to 4000mg /day for persons without liver problems, or 2000mg /day for those with liver problems. Cough: Use the benzonatate (generic for Tessalon) for cough.  Teas, warm liquids, broths, and honey can also help with cough. Zyrtec or Claritin: May add these medication daily to control underlying symptoms of congestion, sneezing, and other signs of allergies.  These medications are available over-the-counter. Generics: Cetirizine  (generic for Zyrtec) and loratadine (generic for Claritin). Fluticasone: Use fluticasone (generic for Flonase), as directed, for nasal and sinus congestion.  This medication is available over-the-counter. Congestion: Plain guaifenesin (generic for plain Mucinex) may help relieve congestion. Saline sinus rinses and saline nasal sprays may also help relieve congestion. If you do not have high blood pressure, heart problems, or an allergy to such medications, you may also try phenylephrine or Sudafed. Sore throat: Warm liquids or Chloraseptic spray may help soothe a sore throat. Gargle twice a day with a salt water solution made from a half teaspoon of salt in a cup of warm water.  Follow up: Follow up with a primary care provider within the next two weeks should symptoms fail to resolve. Return: Return to the ED for significantly worsening symptoms, shortness of breath, persistent vomiting, large amounts of blood in stool, or any other major concerns.  For prescription assistance, may try using prescription discount sites or apps, such as goodrx.com  Test Results for COVID-19 pending  You have a test pending for COVID-19.  Results typically return within about 48 hours.  Be sure to check MyChart for updated results.  We recommend isolating yourself until results are received.  Patients who have symptoms consistent with COVID-19 should self isolated for: At least 3 days (72 hours) have passed since recovery, defined as resolution of fever without the use of fever reducing medications and improvement in respiratory symptoms (e.g., cough, shortness of breath), and At least 7 days have passed since symptoms first appeared.  If you have no symptoms, but your test returns positive, recommend isolating for at least 10 days.

## 2020-04-30 NOTE — ED Notes (Signed)
Pt asleep in lobby. Eventually able to wake up to take to tx room

## 2020-04-30 NOTE — ED Triage Notes (Signed)
Back to stomach pain starting today. Sore throat for 2-3 days. -n/v/d.

## 2020-07-30 ENCOUNTER — Other Ambulatory Visit: Payer: Self-pay

## 2020-07-30 ENCOUNTER — Encounter (HOSPITAL_COMMUNITY): Payer: Self-pay | Admitting: Emergency Medicine

## 2020-07-30 ENCOUNTER — Emergency Department (HOSPITAL_COMMUNITY)
Admission: EM | Admit: 2020-07-30 | Discharge: 2020-07-30 | Disposition: A | Discharge status: Home / Self Care | Payer: Self-pay | Attending: Emergency Medicine | Admitting: Emergency Medicine

## 2020-07-30 DIAGNOSIS — J029 Acute pharyngitis, unspecified: Secondary | ICD-10-CM | POA: Insufficient documentation

## 2020-07-30 DIAGNOSIS — M791 Myalgia, unspecified site: Secondary | ICD-10-CM | POA: Insufficient documentation

## 2020-07-30 DIAGNOSIS — J45909 Unspecified asthma, uncomplicated: Secondary | ICD-10-CM | POA: Insufficient documentation

## 2020-07-30 DIAGNOSIS — R1030 Lower abdominal pain, unspecified: Secondary | ICD-10-CM | POA: Insufficient documentation

## 2020-07-30 DIAGNOSIS — F17210 Nicotine dependence, cigarettes, uncomplicated: Secondary | ICD-10-CM | POA: Insufficient documentation

## 2020-07-30 DIAGNOSIS — Z20822 Contact with and (suspected) exposure to covid-19: Secondary | ICD-10-CM | POA: Insufficient documentation

## 2020-07-30 DIAGNOSIS — R112 Nausea with vomiting, unspecified: Secondary | ICD-10-CM | POA: Insufficient documentation

## 2020-07-30 DIAGNOSIS — R197 Diarrhea, unspecified: Secondary | ICD-10-CM | POA: Insufficient documentation

## 2020-07-30 LAB — RESPIRATORY PANEL BY RT PCR (FLU A&B, COVID)
Influenza A by PCR: NEGATIVE
Influenza B by PCR: NEGATIVE
SARS Coronavirus 2 by RT PCR: NEGATIVE

## 2020-07-30 LAB — GROUP A STREP BY PCR: Group A Strep by PCR: NOT DETECTED

## 2020-07-30 MED ORDER — KETOROLAC TROMETHAMINE 60 MG/2ML IM SOLN
60.0000 mg | Freq: Once | INTRAMUSCULAR | Status: AC
  Administered 2020-07-30: 60 mg via INTRAMUSCULAR
  Filled 2020-07-30: qty 2

## 2020-07-30 MED ORDER — ONDANSETRON 4 MG PO TBDP
4.0000 mg | ORAL_TABLET | Freq: Once | ORAL | Status: AC
  Administered 2020-07-30: 4 mg via ORAL
  Filled 2020-07-30: qty 1

## 2020-07-30 MED ORDER — ONDANSETRON HCL 4 MG PO TABS: 4.0000 mg | ORAL_TABLET | Freq: Four times a day (QID) | ORAL | 0 refills | Status: DC

## 2020-07-30 NOTE — ED Provider Notes (Signed)
[facility] EMERGENCY DEPARTMENT Provider Note   CSN: [id number] Arrival date & time: 07/30/20  2033     History Chief Complaint  Patient presents with  . Sore Throat    Mallory Willis is a 24 y.o. female.  HPI      Mallory Willis is a 24 y.o. female who presents to the Emergency Department complaining of sore throat, nausea, vomiting and diarrhea.  Symptoms began yesterday with nausea, vomiting and diarrhea.  Sore throat began this morning. Also reports generalized body aches.  Reports 4-5 episodes of vomiting of stomach contents today, loose stools as well, non bloody or black. Mild, lower abdominal pain that she describes as crampy and "feels like gas pains"  occurs before defecation.  She states that her child had similar symptoms last week.  No known covid exposures, she has been fully vaccinated.  She denies fever, cough, chest pain, shortness of breath and dysuria.    Past Medical History:  Diagnosis Date  . Asthma   . Chronic back pain   . Gonorrhea   . Nausea and vomiting during pregnancy 08/14/2016  . Supervision of normal pregnancy 08/14/2016    [facility] at   7+2 weeks FOB  malick mcfarland 24 yo bm 2nd Dating By  LMP and [place]  Pap   NA GC/CT Initial:                36+wks: Genetic Screen NT/IT:  CF screen   Declined  Anatomic [place]  Flu vaccine  Tdap Recommended ~ 28wks Glucose Screen  2 hr GBS  Feed Preference  Contraception  Circumcision  Childbirth Classes  Pediatrician      Patient Active Problem List   Diagnosis Date Noted  . Right flank pain 01/06/2018  . Screening examination for STD (sexually transmitted disease) 01/06/2018  . Dysuria 01/06/2018  . Urinary tract infection with hematuria 01/06/2018  . Post term pregnancy at [redacted] weeks gestation 04/09/2017  . Gestational hypertension 04/09/2017  . Gonorrhea affecting pregnancy in third trimester 03/11/2017  . Marijuana use 09/21/2016  . Supervision of normal pregnancy 08/14/2016  . Nausea and  vomiting during pregnancy 08/14/2016    Past Surgical History:  Procedure Laterality Date  . CESAREAN SECTION N/A 04/10/2017   Procedure: CESAREAN SECTION;  Surgeon: Tilda Burrow, MD;  Location: [facility] BIRTHING SUITES;  Service: Obstetrics;  Laterality: N/A;     OB History    Gravida  1   Para  1   Term  1   Preterm      AB      Living  1     SAB      TAB      Ectopic      Multiple  0   Live Births  1           Family History  Problem Relation Age of Onset  . Other Mother        degenerative disc  . Other Father        heart surgery    Social History   Tobacco Use  . Smoking status: Current Every Day Smoker    Packs/day: 0.50    Years: 3.00    Pack years: 1.50    Types: Cigarettes  . Smokeless tobacco: Never Used  Vaping Use  . Vaping Use: Never used  Substance Use Topics  . Alcohol use: No  . Drug use: No    Types: Marijuana    Comment:  last used 08/2017    Home Medications Prior to Admission medications   Medication Sig Start Date End Date Taking? Authorizing Provider  albuterol (VENTOLIN HFA) 108 (90 Base) MCG/ACT inhaler Inhale 1-2 puffs into the lungs every 6 (six) hours as needed for wheezing or shortness of breath.    [provider]  benzonatate (TESSALON) 100 MG capsule Take 1 capsule (100 mg total) by mouth every 8 (eight) hours. 04/30/20   Joy, Shawn C, PA-C  cetirizine (ZYRTEC) 10 MG tablet Take 10 mg by mouth as needed.     [provider]  megestrol (MEGACE) 40 MG tablet Take 1 tablet (40 mg total) by mouth 3 (three) times daily. Til bleeding stops then once daily til completed. 10/17/19   Tilda Burrow, MD  ondansetron (ZOFRAN) 4 MG tablet Take 1 tablet (4 mg total) by mouth every 8 (eight) hours as needed for nausea or vomiting. 03/19/20   Terrilee Files, MD  oxyCODONE-acetaminophen (PERCOCET/ROXICET) 5-325 MG tablet Take 1-2 tablets by mouth every 6 (six) hours as needed for severe pain. 03/19/20   Terrilee Files, MD    Allergies    Hydrocodone, Amoxicillin, Ceclor [cefaclor], Nsaids, Red dye, and Ultram [tramadol]  Review of Systems   Review of Systems  Constitutional: Negative for activity change, appetite change, chills and fever.  HENT: Positive for congestion and sore throat. Negative for ear pain, facial swelling, trouble swallowing and voice change.   Eyes: Negative for pain and visual disturbance.  Respiratory: Negative for cough and shortness of breath.   Gastrointestinal: Positive for diarrhea, nausea and vomiting. Negative for abdominal pain.  Genitourinary: Negative for dysuria and flank pain.  Musculoskeletal: Negative for arthralgias, neck pain and neck stiffness.  Skin: Negative for color change and rash.  Neurological: Negative for dizziness, facial asymmetry, speech difficulty, weakness, numbness and headaches.  Hematological: Negative for adenopathy.    Physical Exam Updated Vital Signs BP 121/85 (BP Location: Right Arm)   Pulse 74   Temp 98.9 F (37.2 C) (Oral)   Resp 18   Ht 5\' 9"  (1.753 m)   Wt 115.8 kg   LMP 07/15/2020 (Approximate)   SpO2 100%   BMI 37.72 kg/m   Physical Exam Vitals and nursing note reviewed.  Constitutional:      Appearance: Normal appearance. She is not ill-appearing or toxic-appearing.  HENT:     Head: Normocephalic.  Eyes:     Pupils: Pupils are equal, round, and reactive to light.  Neck:     Thyroid: No thyromegaly.     Trachea: Phonation normal.  Cardiovascular:     Rate and Rhythm: Normal rate and regular rhythm.     Pulses: Normal pulses.  Pulmonary:     Effort: Pulmonary effort is normal.     Breath sounds: Normal breath sounds. No wheezing.  Abdominal:     Palpations: Abdomen is soft.     Tenderness: There is no abdominal tenderness. There is no guarding or rebound.  Musculoskeletal:        General: Normal range of motion.     Cervical back: Normal range of motion and neck supple.  Lymphadenopathy:      Cervical: No cervical adenopathy.  Skin:    General: Skin is warm.     Capillary Refill: Capillary refill takes less than 2 seconds.     Findings: No rash.  Neurological:     General: No focal deficit present.     Mental Status: She is alert.  Sensory: No sensory deficit.     Motor: No weakness.     ED Results / Procedures / Treatments   Labs (all labs ordered are listed, but only abnormal results are displayed) Labs Reviewed  GROUP A STREP BY PCR  RESPIRATORY PANEL BY RT PCR (FLU A&B, COVID)    EKG None  Radiology No results found.  Procedures Procedures (including critical care time)  Medications Ordered in ED Medications  ondansetron (ZOFRAN-ODT) disintegrating tablet 4 mg (has no administration in time range)  ketorolac (TORADOL) injection 60 mg (has no administration in time range)    ED Course  I have reviewed the triage vital signs and the nursing notes.  Pertinent labs & imaging results that were available during my care of the patient were reviewed by me and considered in my medical decision making (see chart for details).    MDM Rules/Calculators/A&P                          Pt here with nausea, vomiting, sore throat and diarrhea.  No known covid exposures.  She is fully vaccinated.  Her daughter had similar symptoms recently.  She is well appearing, non toxic on exam.  Abdomen is soft and non tender.  Doubt acute abdominal process, likely viral.  Strep PCR, covid and influenza tests are negative.  Pt has tolerated oral fluid challenge.  Appears appropriate for d/c home.  Return precautions discussed.    Final Clinical Impression(s) / ED Diagnoses Final diagnoses:  Pharyngitis, unspecified etiology  Nausea vomiting and diarrhea    Rx / DC Orders ED Discharge Orders    None       Pauline Aus, PA-C 07/30/20 2347    Blane Ohara, MD 08/03/20 1512

## 2020-07-30 NOTE — ED Triage Notes (Signed)
Pt c/o sore throat as well as N/V/D.

## 2020-07-30 NOTE — Discharge Instructions (Addendum)
Your strep, influenza and Covid test this evening were all negative.  You likely have a viral process.  Try drinking frequent, small sips of fluids.  Bland diet as tolerated starting tomorrow.  Follow-up with your primary doctor for recheck if needed.

## 2020-08-07 IMAGING — CR DG FINGER INDEX 2+V*R*
1 series · 3 of 3 positions shown · non-contrast
Comparison: None.

CLINICAL DATA: Swollen finger

EXAM:
RIGHT INDEX FINGER 2+V

[Series 1: pa · 0.17mm/px · 3 of 3 slices shown]
[im 1/3]
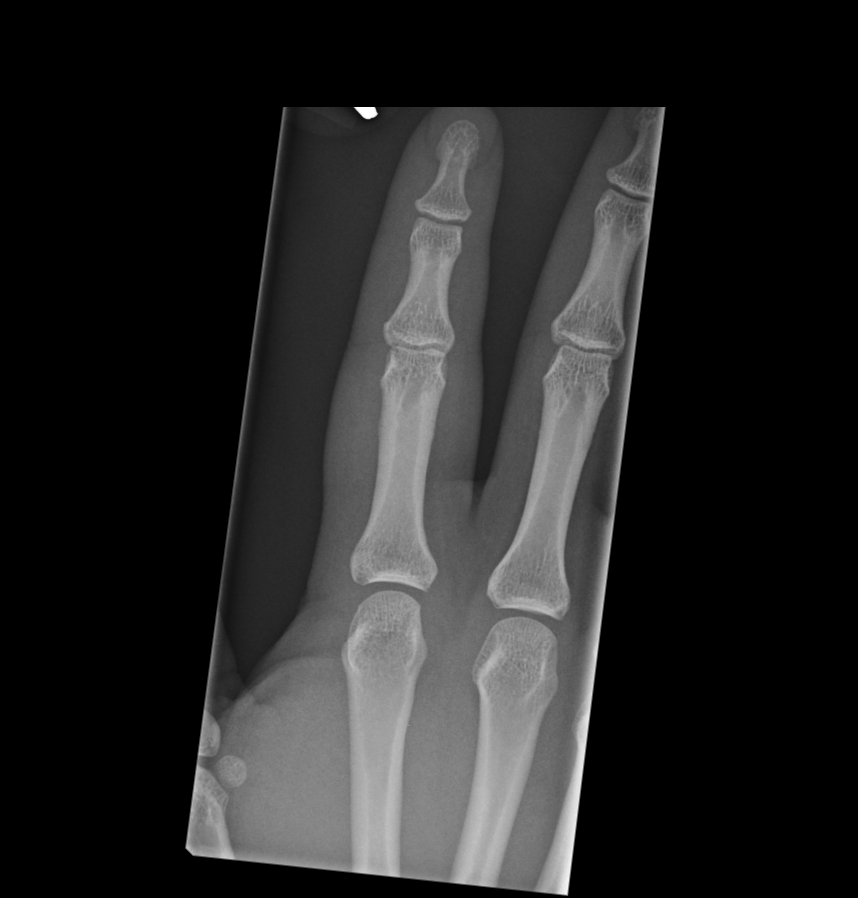
[im 2/3]
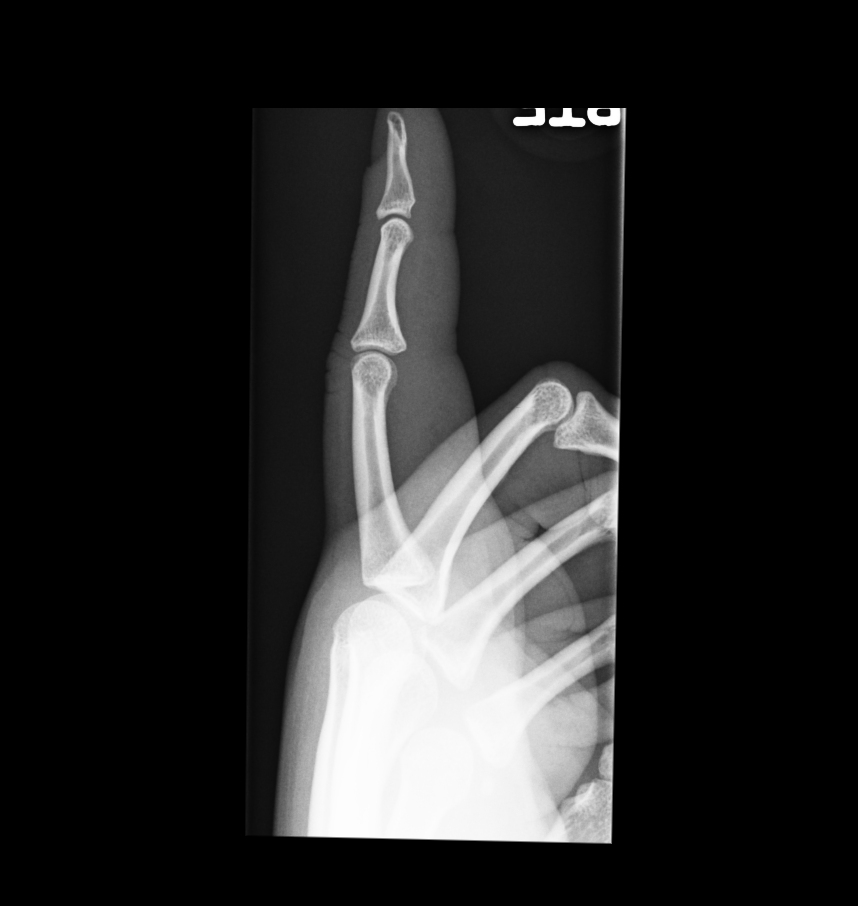
[im 3/3]
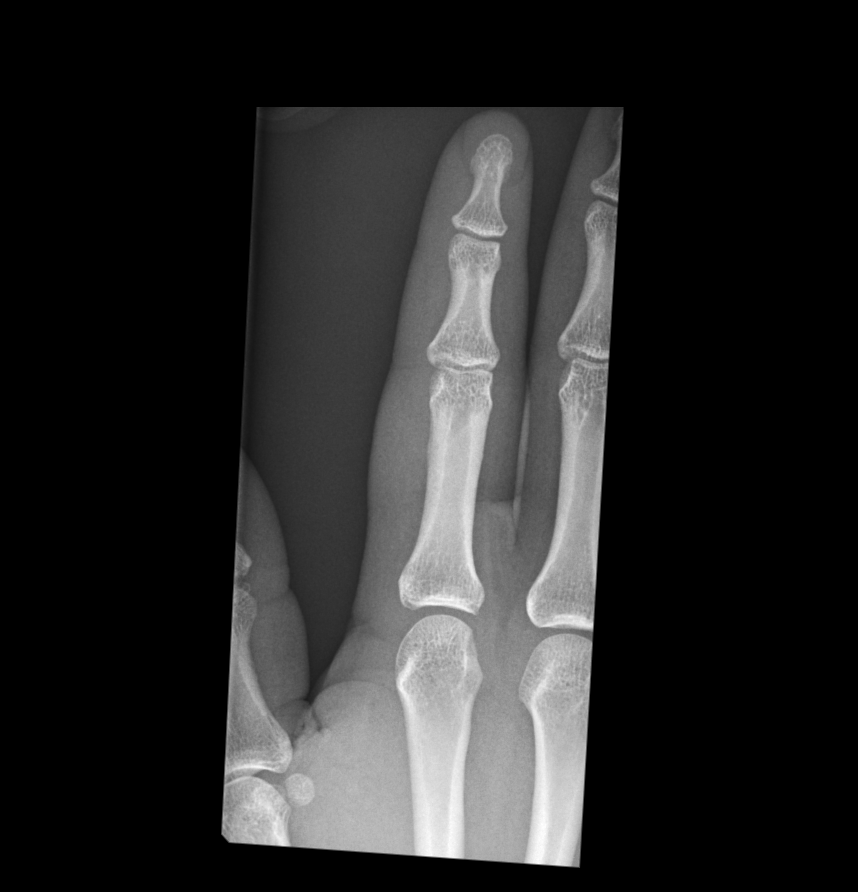

[3 of 3 positions shown; findings below may reference images not displayed]

FINDINGS: Diffuse soft tissue swelling of the right first digit, greatest at
the distal aspect of the proximal phalanx. There is no acute
fracture. Joint spaces are preserved. There is no evidence of
erosion. No intrinsic osseous lesion. No radiopaque foreign body.
IMPRESSION: Right first digit soft tissue swelling without evidence of
significant osseous abnormality.

## 2020-08-20 ENCOUNTER — Other Ambulatory Visit: Payer: Self-pay

## 2020-08-20 ENCOUNTER — Encounter (HOSPITAL_COMMUNITY): Payer: Self-pay

## 2020-08-20 ENCOUNTER — Emergency Department (HOSPITAL_COMMUNITY)
Admission: EM | Admit: 2020-08-20 | Discharge: 2020-08-20 | Disposition: A | Discharge status: Home / Self Care | Payer: Self-pay | Attending: Emergency Medicine | Admitting: Emergency Medicine

## 2020-08-20 DIAGNOSIS — F17210 Nicotine dependence, cigarettes, uncomplicated: Secondary | ICD-10-CM | POA: Insufficient documentation

## 2020-08-20 DIAGNOSIS — K047 Periapical abscess without sinus: Secondary | ICD-10-CM | POA: Insufficient documentation

## 2020-08-20 DIAGNOSIS — J45909 Unspecified asthma, uncomplicated: Secondary | ICD-10-CM | POA: Insufficient documentation

## 2020-08-20 DIAGNOSIS — R030 Elevated blood-pressure reading, without diagnosis of hypertension: Secondary | ICD-10-CM | POA: Insufficient documentation

## 2020-08-20 MED ORDER — CLINDAMYCIN HCL 150 MG PO CAPS: 450.0000 mg | ORAL_CAPSULE | Freq: Three times a day (TID) | ORAL | 0 refills | Status: DC

## 2020-08-20 MED ORDER — OXYCODONE-ACETAMINOPHEN 5-325 MG PO TABS
1.0000 | ORAL_TABLET | Freq: Once | ORAL | Status: AC
  Administered 2020-08-20: 1 via ORAL
  Filled 2020-08-20: qty 1

## 2020-08-20 MED ORDER — OXYCODONE-ACETAMINOPHEN 5-325 MG PO TABS: 1.0000 | ORAL_TABLET | ORAL | 0 refills | Status: DC | PRN

## 2020-08-20 MED ORDER — CLINDAMYCIN HCL 150 MG PO CAPS
300.0000 mg | ORAL_CAPSULE | Freq: Once | ORAL | Status: AC
  Administered 2020-08-20: 300 mg via ORAL
  Filled 2020-08-20: qty 2

## 2020-08-20 NOTE — ED Provider Notes (Signed)
[facility] EMERGENCY DEPARTMENT Provider Note   CSN: [id number] Arrival date & time: 08/20/20  0547     History Chief Complaint  Patient presents with  . Dental Pain    Mallory Willis is a 24 y.o. female with a history of asthma and chronic back pain presenting with a 2-day history of dental pain.  She describes sharp stabs of pain involving her right upper and the lower third molar teeth.  She also endorses cold sensitivity at both of these sites.  She denies any recent dental trauma, she has no gingival swelling but has persistent severe pain which has not responded to Tylenol and Orajel.  She does not currently have a dentist.  She denies fevers or chills.  Also denies ear pain or drainage, nasal or sinus congestion.  Denies sore throat or difficulty swallowing.  HPI     Past Medical History:  Diagnosis Date  . Asthma   . Chronic back pain   . Gonorrhea   . Nausea and vomiting during pregnancy 08/14/2016  . Supervision of normal pregnancy 08/14/2016    [facility] at   7+2 weeks FOB  malick mcfarland 24 yo bm 2nd Dating By  LMP and [place]  Pap   NA GC/CT Initial:                36+wks: Genetic Screen NT/IT:  CF screen   Declined  Anatomic [place]  Flu vaccine  Tdap Recommended ~ 28wks Glucose Screen  2 hr GBS  Feed Preference  Contraception  Circumcision  Childbirth Classes  Pediatrician      Patient Active Problem List   Diagnosis Date Noted  . Right flank pain 01/06/2018  . Screening examination for STD (sexually transmitted disease) 01/06/2018  . Dysuria 01/06/2018  . Urinary tract infection with hematuria 01/06/2018  . Post term pregnancy at [redacted] weeks gestation 04/09/2017  . Gestational hypertension 04/09/2017  . Gonorrhea affecting pregnancy in third trimester 03/11/2017  . Marijuana use 09/21/2016  . Supervision of normal pregnancy 08/14/2016  . Nausea and vomiting during pregnancy 08/14/2016    Past Surgical History:  Procedure Laterality Date  .  CESAREAN SECTION N/A 04/10/2017   Procedure: CESAREAN SECTION;  Surgeon: Tilda Burrow, MD;  Location: [facility] BIRTHING SUITES;  Service: Obstetrics;  Laterality: N/A;     OB History    Gravida  1   Para  1   Term  1   Preterm      AB      Living  1     SAB      TAB      Ectopic      Multiple  0   Live Births  1           Family History  Problem Relation Age of Onset  . Other Mother        degenerative disc  . Other Father        heart surgery    Social History   Tobacco Use  . Smoking status: Current Every Day Smoker    Packs/day: 0.50    Years: 3.00    Pack years: 1.50    Types: Cigarettes  . Smokeless tobacco: Never Used  Vaping Use  . Vaping Use: Never used  Substance Use Topics  . Alcohol use: No  . Drug use: No    Types: Marijuana    Comment: last used 08/2017    Home Medications Prior to Admission medications  Medication Sig Start Date End Date Taking? Authorizing Provider  albuterol (VENTOLIN HFA) 108 (90 Base) MCG/ACT inhaler Inhale 1-2 puffs into the lungs every 6 (six) hours as needed for wheezing or shortness of breath.    [provider]  benzonatate (TESSALON) 100 MG capsule Take 1 capsule (100 mg total) by mouth every 8 (eight) hours. 04/30/20   Joy, Shawn C, PA-C  cetirizine (ZYRTEC) 10 MG tablet Take 10 mg by mouth as needed.     [provider]  clindamycin (CLEOCIN) 150 MG capsule Take 3 capsules (450 mg total) by mouth 3 (three) times daily. 08/20/20   Burgess Amor, PA-C  megestrol (MEGACE) 40 MG tablet Take 1 tablet (40 mg total) by mouth 3 (three) times daily. Til bleeding stops then once daily til completed. 10/17/19   Tilda Burrow, MD  ondansetron (ZOFRAN) 4 MG tablet Take 1 tablet (4 mg total) by mouth every 6 (six) hours. 07/30/20   Triplett, Tammy, PA-C  oxyCODONE-acetaminophen (PERCOCET/ROXICET) 5-325 MG tablet Take 1 tablet by mouth every 4 (four) hours as needed. 08/20/20   Burgess Amor, PA-C     Allergies    Hydrocodone, Amoxicillin, Ceclor [cefaclor], Nsaids, Red dye, and Ultram [tramadol]  Review of Systems   Review of Systems  Constitutional: Negative for fever.  HENT: Positive for dental problem. Negative for facial swelling and sore throat.   Respiratory: Negative for shortness of breath.   Musculoskeletal: Negative for neck pain and neck stiffness.  All other systems reviewed and are negative.   Physical Exam Updated Vital Signs BP (!) 144/89 (BP Location: Right Arm)   Pulse 93   Temp 98 F (36.7 C) (Oral)   Resp 18   Ht 5\' 9"  (1.753 m)   Wt 113.4 kg   LMP 07/16/2020   SpO2 100%   BMI 36.92 kg/m   Physical Exam Constitutional:      General: She is not in acute distress.    Appearance: She is well-developed.  HENT:     Head: Normocephalic and atraumatic.     Jaw: No trismus.     Right Ear: Tympanic membrane and external ear normal.     Left Ear: Tympanic membrane and external ear normal.     Mouth/Throat:     Mouth: No oral lesions.     Dentition: Dental abscesses present.     Pharynx: Uvula midline. No pharyngeal swelling, oropharyngeal exudate or posterior oropharyngeal erythema.     Comments: Sublingual space is soft.  Patient's right lower second molar appears to have decay of the occlusive surface.  There is no obvious decay of her right third upper and lower molars which are both partially erupted.  She does have tenderness at her lateral gingiva without significant edema or erythema.  There is no drainage from the dental site. Eyes:     Conjunctiva/sclera: Conjunctivae normal.  Cardiovascular:     Rate and Rhythm: Normal rate.     Heart sounds: Normal heart sounds.  Pulmonary:     Effort: Pulmonary effort is normal.  Musculoskeletal:        General: Normal range of motion.     Cervical back: Normal range of motion and neck supple.  Lymphadenopathy:     Cervical: No cervical adenopathy.  Skin:    General: Skin is warm and dry.      Findings: No erythema.  Neurological:     Mental Status: She is alert and oriented to person, place, and time.  ED Results / Procedures / Treatments   Labs (all labs ordered are listed, but only abnormal results are displayed) Labs Reviewed - No data to display  EKG None  Radiology No results found.  Procedures Procedures (including critical care time)  Medications Ordered in ED Medications  oxyCODONE-acetaminophen (PERCOCET/ROXICET) 5-325 MG per tablet 1 tablet (1 tablet Oral Given 08/20/20 0949)  clindamycin (CLEOCIN) capsule 300 mg (300 mg Oral Given 08/20/20 0950)    ED Course  I have reviewed the triage vital signs and the nursing notes.  Pertinent labs & imaging results that were available during my care of the patient were reviewed by me and considered in my medical decision making (see chart for details).    MDM Rules/Calculators/A&P                          Patient with dental pain, decay of her right lower second molar with cold sensitivity suggesting probable nerve exposure.  I suspect there is a apical abscess present.  She was started on clindamycin, oxycodone was given for the next 24 hours while the antibiotic gets on board.  She was given referrals for follow-up dental care.  Her blood pressure is also elevated today, she was advised to have this rechecked within 1 week, referral given to the [facility] clinic for this follow-up check. Final Clinical Impression(s) / ED Diagnoses Final diagnoses:  Dental infection  Elevated blood pressure reading    Rx / DC Orders ED Discharge Orders         Ordered    clindamycin (CLEOCIN) 150 MG capsule  3 times daily        08/20/20 0930    oxyCODONE-acetaminophen (PERCOCET/ROXICET) 5-325 MG tablet  Every 4 hours PRN        08/20/20 0930           Burgess Amor, PA-C 08/20/20 1047    Bethann Berkshire, MD 08/21/20 1255

## 2020-08-20 NOTE — Discharge Instructions (Addendum)
Complete your entire course of antibiotics as prescribed.  You  may use the percocet for pain relief for the first 2 days, after which tylenol should be sufficient for pain relief once the antibiotic has started to work.  Do not drive within 4 hours of taking as this will make you drowsy.  Avoid applying heat or ice to this abscess area which can worsen your symptoms.  You may use warm salt water swish and spit treatment or half peroxide and water swish and spit after meals to keep this area clean as discussed.  Call the dentist listed above for further management of your symptoms.  Your blood pressure is elevated today which may be from your dental pain.  However, it is strongly recommended you have your blood pressure rechecked within one week.  See info below regarding the [facility] which you can go to, even without insurance coverage.  [facility]  [address] [place], [place] [address] [phone]  Services [facility] offers a variety of basic health services.  Services include but are not limited to: Blood pressure checks  Heart rate checks  Blood sugar checks  Urine analysis  Rapid strep tests  Pregnancy tests.  Health education and referrals  People needing more complex services will be directed to a physician online. Using these virtual visits, doctors can evaluate and prescribe medicine and treatments. There will be no medication on-site, though [facility] will help patients fill their prescriptions at little to no cost.   For More information please go to: [email or web address]

## 2020-08-20 NOTE — ED Triage Notes (Signed)
Pt presents to ED with complaints of right upper and lower dental pain x 2 days. Pt states it is from her wisdom teeth.

## 2021-02-03 ENCOUNTER — Encounter (HOSPITAL_COMMUNITY): Payer: Self-pay | Admitting: *Deleted

## 2021-02-03 ENCOUNTER — Emergency Department (HOSPITAL_COMMUNITY)
Admission: EM | Admit: 2021-02-03 | Discharge: 2021-02-03 | Disposition: A | Discharge status: Home / Self Care | Payer: Self-pay | Attending: Emergency Medicine | Admitting: Emergency Medicine

## 2021-02-03 ENCOUNTER — Other Ambulatory Visit: Payer: Self-pay

## 2021-02-03 DIAGNOSIS — U071 COVID-19: Secondary | ICD-10-CM | POA: Insufficient documentation

## 2021-02-03 DIAGNOSIS — Z5321 Procedure and treatment not carried out due to patient leaving prior to being seen by health care provider: Secondary | ICD-10-CM | POA: Insufficient documentation

## 2021-02-03 NOTE — ED Triage Notes (Signed)
Pt c/o generalized body aches with sore throat and chills that started yesterday; pt has been around someone with covid

## 2021-02-04 ENCOUNTER — Other Ambulatory Visit: Payer: Self-pay

## 2021-02-04 ENCOUNTER — Encounter (HOSPITAL_COMMUNITY): Payer: Self-pay

## 2021-02-04 ENCOUNTER — Emergency Department (HOSPITAL_COMMUNITY)
Admission: EM | Admit: 2021-02-04 | Discharge: 2021-02-05 | Disposition: A | Discharge status: Home / Self Care | Payer: Self-pay | Attending: Emergency Medicine | Admitting: Emergency Medicine

## 2021-02-04 DIAGNOSIS — U071 COVID-19: Secondary | ICD-10-CM | POA: Insufficient documentation

## 2021-02-04 DIAGNOSIS — F17210 Nicotine dependence, cigarettes, uncomplicated: Secondary | ICD-10-CM | POA: Insufficient documentation

## 2021-02-04 DIAGNOSIS — J45909 Unspecified asthma, uncomplicated: Secondary | ICD-10-CM | POA: Insufficient documentation

## 2021-02-04 LAB — SARS CORONAVIRUS 2 (TAT 6-24 HRS): SARS Coronavirus 2: POSITIVE — AB

## 2021-02-04 MED ORDER — LACTATED RINGERS IV BOLUS
1000.0000 mL | Freq: Once | INTRAVENOUS | Status: AC
  Administered 2021-02-04: 1000 mL via INTRAVENOUS

## 2021-02-04 MED ORDER — ACETAMINOPHEN 325 MG PO TABS
650.0000 mg | ORAL_TABLET | Freq: Once | ORAL | Status: AC
  Administered 2021-02-04: 650 mg via ORAL
  Filled 2021-02-04: qty 2

## 2021-02-04 NOTE — ED Provider Notes (Signed)
[facility]-EMERGENCY DEPT Provider Note   CSN: [id number] Arrival date & time: 02/04/21  2150     History Chief Complaint  Patient presents with  . Generalized Body Aches    Mallory Willis is a 25 y.o. female.  The history is provided by the patient.  She has history of asthma and comes in because of cough and difficulty breathing.  She started getting sick yesterday and went to [facility] where COVID test was obtained and was positive.  However, it was a long wait and she left without being seen.  Temperature at home has been as high as 101, and she has had chills and sweats.  She states it feels like it is difficult to breathe.  She denies nausea or vomiting but she does complain of generalized body aches.  She is also complaining of a sore throat.  She does smoke cigarettes, but states she has not smoked since she started feeling sick.   Past Medical History:  Diagnosis Date  . Asthma   . Chronic back pain   . Gonorrhea   . Nausea and vomiting during pregnancy 08/14/2016  . Supervision of normal pregnancy 08/14/2016    [facility] at   7+2 weeks FOB  malick mcfarland 25 yo bm 2nd Dating By  LMP and [place]  Pap   NA GC/CT Initial:                36+wks: Genetic Screen NT/IT:  CF screen   Declined  Anatomic [place]  Flu vaccine  Tdap Recommended ~ 28wks Glucose Screen  2 hr GBS  Feed Preference  Contraception  Circumcision  Childbirth Classes  Pediatrician      Patient Active Problem List   Diagnosis Date Noted  . Right flank pain 01/06/2018  . Screening examination for STD (sexually transmitted disease) 01/06/2018  . Dysuria 01/06/2018  . Urinary tract infection with hematuria 01/06/2018  . Post term pregnancy at [redacted] weeks gestation 04/09/2017  . Gestational hypertension 04/09/2017  . Gonorrhea affecting pregnancy in third trimester 03/11/2017  . Marijuana use 09/21/2016  . Supervision of normal pregnancy 08/14/2016  . Nausea and  vomiting during pregnancy 08/14/2016    Past Surgical History:  Procedure Laterality Date  . CESAREAN SECTION N/A 04/10/2017   Procedure: CESAREAN SECTION;  Surgeon: Tilda Burrow, MD;  Location: [facility] BIRTHING SUITES;  Service: Obstetrics;  Laterality: N/A;     OB History    Gravida  1   Para  1   Term  1   Preterm      AB      Living  1     SAB      IAB      Ectopic      Multiple  0   Live Births  1           Family History  Problem Relation Age of Onset  . Other Mother        degenerative disc  . Other Father        heart surgery    Social History   Tobacco Use  . Smoking status: Current Every Day Smoker    Packs/day: 0.50    Years: 3.00    Pack years: 1.50    Types: Cigarettes  . Smokeless tobacco: Never Used  Vaping Use  . Vaping Use: Never used  Substance Use Topics  . Alcohol use: No  . Drug use: No  Types: Marijuana    Comment: last used 08/2017    Home Medications Prior to Admission medications   Medication Sig Start Date End Date Taking? Authorizing Provider  albuterol (VENTOLIN HFA) 108 (90 Base) MCG/ACT inhaler Inhale 1-2 puffs into the lungs every 6 (six) hours as needed for wheezing or shortness of breath.    [provider]  benzonatate (TESSALON) 100 MG capsule Take 1 capsule (100 mg total) by mouth every 8 (eight) hours. 04/30/20   Joy, Shawn C, PA-C  cetirizine (ZYRTEC) 10 MG tablet Take 10 mg by mouth as needed.     [provider]  clindamycin (CLEOCIN) 150 MG capsule Take 3 capsules (450 mg total) by mouth 3 (three) times daily. 08/20/20   Burgess Amor, PA-C  megestrol (MEGACE) 40 MG tablet Take 1 tablet (40 mg total) by mouth 3 (three) times daily. Til bleeding stops then once daily til completed. 10/17/19   Tilda Burrow, MD  ondansetron (ZOFRAN) 4 MG tablet Take 1 tablet (4 mg total) by mouth every 6 (six) hours. 07/30/20   Triplett, Tammy, PA-C  oxyCODONE-acetaminophen (PERCOCET/ROXICET) 5-325 MG tablet  Take 1 tablet by mouth every 4 (four) hours as needed. 08/20/20   Burgess Amor, PA-C    Allergies    Hydrocodone, Amoxicillin, Ceclor [cefaclor], Nsaids, Red dye, and Ultram [tramadol]  Review of Systems   Review of Systems  All other systems reviewed and are negative.   Physical Exam Updated Vital Signs BP (!) 138/92 (BP Location: Right Arm)   Pulse (!) 122   Temp 97.8 F (36.6 C) (Oral)   Resp 18   Wt 104.3 kg   LMP 01/19/2021 (Approximate)   SpO2 100%   BMI 33.97 kg/m   Physical Exam Vitals and nursing note reviewed.   25 year old female, resting comfortably and in no acute distress. Vital signs are significant for elevated heart rate and borderline elevated blood pressure. Oxygen saturation is 100%, which is normal. Head is normocephalic and atraumatic. PERRLA, EOMI. Oropharynx is clear. Neck is nontender and supple without adenopathy or JVD. Back is nontender and there is no CVA tenderness. Lungs are clear without rales, wheezes, or rhonchi. Chest is nontender. Heart has regular rate and rhythm without murmur. Abdomen is soft, flat, nontender without masses or hepatosplenomegaly and peristalsis is normoactive. Extremities have no cyanosis or edema, full range of motion is present. Skin is warm and dry without rash. Neurologic: Mental status is normal, cranial nerves are intact, there are no motor or sensory deficits.  ED Results / Procedures / Treatments    Radiology DG Chest Port 1 View  Result Date: 02/05/2021 CLINICAL DATA:  Cough, COVID positive EXAM: PORTABLE CHEST 1 VIEW COMPARISON:  09/09/2020 FINDINGS: The lungs are symmetrically well expanded. Opacity at the left lung base likely relates to overlying soft tissue. The lungs are otherwise clear. No pneumothorax or pleural effusion. Cardiac size within normal limits. Pulmonary vascularity is normal. No acute bone abnormality. IMPRESSION: No active disease. Electronically Signed   By: Helyn Numbers MD   On:  02/05/2021 00:27    Procedures Procedures   Medications Ordered in ED Medications  acetaminophen (TYLENOL) tablet 650 mg (has no administration in time range)  lactated ringers bolus 1,000 mL (1,000 mLs Intravenous New Bag/Given 02/04/21 2358)  acetaminophen (TYLENOL) tablet 650 mg (650 mg Oral Given 02/04/21 2352)    ED Course  I have reviewed the triage vital signs and the nursing notes.  Pertinent imaging results that were  available during my care of the patient were reviewed by me and considered in my medical decision making (see chart for details).   MDM Rules/Calculators/A&P                         COVID-19 infection.  Clinical picture is consistent with COVID-19.  Doubt significant pulmonary involvement given normal oxygen saturation, but will check chest x-ray.  Because of tachycardia, will give some IV fluids.  She is given acetaminophen for aching, cannot take NSAIDs because of history of ulcer.  Old records reviewed confirming COVID-19 test yesterday which was positive.  Chest x-ray is unremarkable.  Following IV fluids and acetaminophen, patient is not feeling any better, but she continues to maintain good oxygen saturation on room air and there is no indication for hospitalization.  Because of significant underlying condition, she is discharged with prescription for Paxlovid.  Return precautions discussed.  Final Clinical Impression(s) / ED Diagnoses Final diagnoses:  COVID-19 virus infection    Rx / DC Orders ED Discharge Orders         Ordered    nirmatrelvir/ritonavir EUA (PAXLOVID) TABS  2 times daily,   Status:  Discontinued        02/05/21 0050    nirmatrelvir/ritonavir EUA (PAXLOVID) TABS  2 times daily        02/05/21 0103           Dione Booze, MD 02/05/21 0105

## 2021-02-04 NOTE — ED Provider Notes (Signed)
Emergency Medicine Provider Triage Evaluation Note  Mickie Badders , a 25 y.o. female  was evaluated in triage.  Pt complains of body aches, fatigue, fevers.  Covid positive test at [facility] yesterday.  Had shortness of breath that has resolved. Is here because she was told when she checked her my chart it said she needed to come back to see "how bad her infection is."  Review of Systems  Positive: Fevers, body aches, fatigue Negative: Shortness of breath, chest pain  Physical Exam  BP (!) 138/92 (BP Location: Right Arm)   Pulse (!) 122   Temp 97.8 F (36.6 C) (Oral)   Resp 18   Wt 104.3 kg   LMP 01/19/2021 (Approximate)   SpO2 100%   BMI 33.97 kg/m  Gen:   Awake, no distress   Resp:  Normal effort  MSK:   Moves extremities without difficulty  Other:  Speaks in full sentences with out difficulty.   Medical Decision Making  Medically screening exam initiated at 10:43 PM.  Appropriate orders placed.  Karena Kinker was informed that the remainder of the evaluation will be completed by another provider, this initial triage assessment does not replace that evaluation, and the importance of remaining in the ED until their evaluation is complete.     Cristina Gong, PA-C 02/04/21 2250    Bethann Berkshire, MD 02/04/21 2252

## 2021-02-04 NOTE — ED Triage Notes (Signed)
Pt c/o body aches, sore throat, fever, runny nose and congestion since yesterday. Had a covid test yesterday that came back positive. Last dose of tylenol @ 2000.

## 2021-02-05 ENCOUNTER — Emergency Department (HOSPITAL_COMMUNITY): Payer: Self-pay

## 2021-02-05 ENCOUNTER — Other Ambulatory Visit (HOSPITAL_COMMUNITY): Payer: Self-pay

## 2021-02-05 MED ORDER — ACETAMINOPHEN 325 MG PO TABS
650.0000 mg | ORAL_TABLET | Freq: Once | ORAL | Status: DC
  Filled 2021-02-05: qty 2

## 2021-02-05 MED ORDER — NIRMATRELVIR/RITONAVIR (PAXLOVID)TABLET: 3.0000 | ORAL_TABLET | Freq: Two times a day (BID) | ORAL | 0 refills | Status: AC

## 2021-02-05 MED ORDER — NIRMATRELVIR/RITONAVIR (PAXLOVID)TABLET
3.0000 | ORAL_TABLET | Freq: Two times a day (BID) | ORAL | 0 refills | Status: DC
  Filled 2021-02-05: qty 30, 5d supply, fill #0

## 2021-02-05 NOTE — Discharge Instructions (Signed)
Drink plenty of fluids.  Take acetaminophen as needed for fever or aching.  Return to the emergency department if your breathing is getting worse.

## 2021-02-19 ENCOUNTER — Other Ambulatory Visit: Payer: Self-pay

## 2021-02-19 ENCOUNTER — Encounter (HOSPITAL_COMMUNITY): Payer: Self-pay

## 2021-02-19 ENCOUNTER — Emergency Department (HOSPITAL_COMMUNITY)
Admission: EM | Admit: 2021-02-19 | Discharge: 2021-02-20 | Disposition: A | Discharge status: Home / Self Care | Payer: Self-pay | Attending: Emergency Medicine | Admitting: Emergency Medicine

## 2021-02-19 DIAGNOSIS — J45909 Unspecified asthma, uncomplicated: Secondary | ICD-10-CM | POA: Insufficient documentation

## 2021-02-19 DIAGNOSIS — F17210 Nicotine dependence, cigarettes, uncomplicated: Secondary | ICD-10-CM | POA: Insufficient documentation

## 2021-02-19 DIAGNOSIS — N12 Tubulo-interstitial nephritis, not specified as acute or chronic: Secondary | ICD-10-CM | POA: Insufficient documentation

## 2021-02-19 LAB — URINALYSIS, ROUTINE W REFLEX MICROSCOPIC
Bilirubin Urine: NEGATIVE
Glucose, UA: NEGATIVE mg/dL
Ketones, ur: 5 mg/dL — AB
Nitrite: POSITIVE — AB
Protein, ur: 100 mg/dL — AB
Specific Gravity, Urine: 1.019 (ref 1.005–1.030)
pH: 5 (ref 5.0–8.0)

## 2021-02-19 LAB — PREGNANCY, URINE: Preg Test, Ur: NEGATIVE

## 2021-02-19 MED ORDER — ACETAMINOPHEN 325 MG PO TABS
650.0000 mg | ORAL_TABLET | Freq: Once | ORAL | Status: DC
  Filled 2021-02-19: qty 2

## 2021-02-19 MED ORDER — CIPROFLOXACIN HCL 250 MG PO TABS
500.0000 mg | ORAL_TABLET | Freq: Once | ORAL | Status: AC
  Administered 2021-02-20: 500 mg via ORAL
  Filled 2021-02-19: qty 2

## 2021-02-19 NOTE — ED Triage Notes (Signed)
Pt to er via ems, per ems pt was sent from urgent care for concerns or sepsis secondary to a uti.  Pt c/o back pain started a couple of days ago, states that she felt hot and would start vomiting, states that they dx her with a uti and were concerned that she had pylo.

## 2021-02-20 MED ORDER — OXYCODONE HCL 5 MG PO TABS
5.0000 mg | ORAL_TABLET | ORAL | 0 refills | Status: DC | PRN
Start: 2021-02-20 — End: 2021-12-05

## 2021-02-20 MED ORDER — OXYCODONE HCL 5 MG PO TABS
5.0000 mg | ORAL_TABLET | Freq: Once | ORAL | Status: AC
  Administered 2021-02-20: 01:00:00 5 mg via ORAL
  Filled 2021-02-20: qty 1

## 2021-02-20 MED ORDER — CIPROFLOXACIN HCL 500 MG PO TABS: 500.0000 mg | ORAL_TABLET | Freq: Two times a day (BID) | ORAL | 0 refills | Status: AC

## 2021-02-20 NOTE — ED Provider Notes (Signed)
[facility]-EMERGENCY DEPT [facility] Emergency Department Provider Note MRN:  [id number]  Arrival date & time: 02/20/21     Chief Complaint   Urinary Frequency   History of Present Illness   Mallory Willis is a 25 y.o. year-old female with no pertinent past medical history presenting to the ED with chief complaint of urinary frequency.  Patient is endorsing right-sided flank pain, some burning at the end of her urination.  Also some nausea and 2 episodes of nonbloody nonbilious emesis.  Sent here from urgent care to be evaluated for pyelonephritis.  Pain is moderate, constant, no exacerbating or alleviating factors.  Gradual onset.  Denies fever.  Review of Systems  A complete 10 system review of systems was obtained and all systems are negative except as noted in the HPI and PMH.   Patient's Health History    Past Medical History:  Diagnosis Date  . Asthma   . Chronic back pain   . Gonorrhea   . Nausea and vomiting during pregnancy 08/14/2016  . Supervision of normal pregnancy 08/14/2016    [facility] at   7+2 weeks FOB  malick mcfarland 25 yo bm 2nd Dating By  LMP and [place]  Pap   NA GC/CT Initial:                36+wks: Genetic Screen NT/IT:  CF screen   Declined  Anatomic [place]  Flu vaccine  Tdap Recommended ~ 28wks Glucose Screen  2 hr GBS  Feed Preference  Contraception  Circumcision  Childbirth Classes  Pediatrician      Past Surgical History:  Procedure Laterality Date  . CESAREAN SECTION N/A 04/10/2017   Procedure: CESAREAN SECTION;  Surgeon: Tilda Burrow, MD;  Location: [facility] BIRTHING SUITES;  Service: Obstetrics;  Laterality: N/A;    Family History  Problem Relation Age of Onset  . Other Mother        degenerative disc  . Other Father        heart surgery    Social History   Socioeconomic History  . Marital status: Single    Spouse name: Not on file  . Number of children: Not on file  . Years of education: Not on file  . Highest education level:  Not on file  Occupational History  . Not on file  Tobacco Use  . Smoking status: Current Every Day Smoker    Packs/day: 0.50    Years: 3.00    Pack years: 1.50    Types: Cigarettes  . Smokeless tobacco: Never Used  Vaping Use  . Vaping Use: Never used  Substance and Sexual Activity  . Alcohol use: No  . Drug use: No    Types: Marijuana    Comment: last used 08/2017  . Sexual activity: Not Currently    Birth control/protection: None  Other Topics Concern  . Not on file  Social History Narrative  . Not on file   Social Determinants of Health   Financial Resource Strain: Not on file  Food Insecurity: Not on file  Transportation Needs: Not on file  Physical Activity: Not on file  Stress: Not on file  Social Connections: Not on file  Intimate Partner Violence: Not on file     Physical Exam   Vitals:   02/19/21 2146 02/19/21 2330  BP: 119/78 111/67  Pulse: 98 68  Resp: 17 18  Temp: 99.2 F (37.3 C)   SpO2: 100% 99%    CONSTITUTIONAL: Well-appearing, NAD  NEURO:  Alert and oriented x 3, no focal deficits EYES:  eyes equal and reactive ENT/NECK:  no LAD, no JVD CARDIO: Regular rate, well-perfused, normal S1 and S2 PULM:  CTAB no wheezing or rhonchi GI/GU:  normal bowel sounds, non-distended, non-tender, right CVA tenderness MSK/SPINE:  No gross deformities, no edema SKIN:  no rash, atraumatic PSYCH:  Appropriate speech and behavior  *Additional and/or pertinent findings included in MDM below  Diagnostic and Interventional Summary    EKG Interpretation  Date/Time:    Ventricular Rate:    PR Interval:    QRS Duration:   QT Interval:    QTC Calculation:   R Axis:     Text Interpretation:        Labs Reviewed  URINALYSIS, ROUTINE W REFLEX MICROSCOPIC - Abnormal; Notable for the following components:      Result Value   Color, Urine AMBER (*)    APPearance TURBID (*)    Hgb urine dipstick SMALL (*)    Ketones, ur 5 (*)    Protein, ur 100 (*)     Nitrite POSITIVE (*)    Leukocytes,Ua MODERATE (*)    Bacteria, UA MANY (*)    All other components within normal limits  PREGNANCY, URINE    No orders to display    Medications  acetaminophen (TYLENOL) tablet 650 mg (650 mg Oral Patient Refused/Not Given 02/20/21 0001)  oxyCODONE (Oxy IR/ROXICODONE) immediate release tablet 5 mg (has no administration in time range)  ciprofloxacin (CIPRO) tablet 500 mg (500 mg Oral Given 02/20/21 0001)     Procedures  /  Critical Care Procedures  ED Course and Medical Decision Making  I have reviewed the triage vital signs, the nursing notes, and pertinent available records from the EMR.  Listed above are laboratory and imaging tests that I personally ordered, reviewed, and interpreted and then considered in my medical decision making (see below for details).  History and exam are consistent with pyelonephritis.  Doubt concomitant kidney stone.  No stones seen on her CT scan from 2020.  Gradual onset.  She has no fever here, completely normal vital signs, no acute distress, well-appearing, nothing to suggest sepsis.  She is tolerating oral antibiotics and pain medicine here in the emergency department.  Appropriate for discharge.  Providing ciprofloxacin given her allergies.       Elmer Sow. Pilar Plate, MD [facility] Emergency Medicine [facility] [email or web address]  Final Clinical Impressions(s) / ED Diagnoses     ICD-10-CM   1. Pyelonephritis  N12     ED Discharge Orders         Ordered    oxyCODONE (ROXICODONE) 5 MG immediate release tablet  Every 4 hours PRN        02/20/21 0029    ciprofloxacin (CIPRO) 500 MG tablet  Every 12 hours        02/20/21 0029           Discharge Instructions Discussed with and Provided to Patient:     Discharge Instructions     You were evaluated in the Emergency Department and after careful evaluation, we did not find any emergent condition requiring admission or further testing in the  hospital.  Your exam/testing today was overall reassuring.  Your symptoms seem to be due to a kidney infection.  Please take the ciprofloxacin antibiotics as directed.  Use the oxycodone medication as needed for pain.  Please return to the Emergency Department if you experience any worsening of your  condition.  Thank you for allowing [place] to be a part of your care.       Sabas Sous, MD 02/20/21 [phone]

## 2021-02-20 NOTE — Discharge Instructions (Addendum)
You were evaluated in the Emergency Department and after careful evaluation, we did not find any emergent condition requiring admission or further testing in the hospital.  Your exam/testing today was overall reassuring.  Your symptoms seem to be due to a kidney infection.  Please take the ciprofloxacin antibiotics as directed.  Use the oxycodone medication as needed for pain.  Please return to the Emergency Department if you experience any worsening of your condition.  Thank you for allowing [place] to be a part of your care.

## 2021-02-20 NOTE — ED Notes (Signed)
PT refused tylenol stating that she was given same at urgent care and it has not helped. She states she would like something different for pain.

## 2021-09-14 NOTE — L&D Delivery Note (Cosign Needed Addendum)
LABOR COURSE Carleah Yablonski is a 26 y.o. female G2P1001 with IUP at [redacted]w[redacted]d by 12 wk [place] presenting for SOL. TOLAC. Augmentation with pitocin. Required terbutaline and amnioinfusion due to variable decelerations. IUPC placed.   Delivery Note Called to room and patient was complete and pushing. Head delivered OA. Tight nuchal cord that was not reducible upon delivery of the head.  After delivery of the head, anterior shoulder found to be stuck at the pubic symphysis. Patient placed in [place] and suprapubic pressure applied. Infant found to have a compound right hand which was swept to facilitate delivery of posterior arm. After delivery of the arm, anterior shoulder and body followed. Shoulder dystocia lasted approximately 50 seconds. At  1550 a viable and healthy female was delivered via Vaginal, Spontaneous (Presentation: OA;ROA).  Infant with spontaneous cry, placed on mother's abdomen, dried and stimulated. Cord clamped x 2 after 1-minute delay, and cut by FOB. Cord blood drawn. Placenta delivered with gentle cord traction. Retained membranes observed in the vaginal vault going into the cervix. Lower uterine sweep performed to evacuate remaining retained membranes.  Placenta appears intact. Fundus firm with massage and Pitocin. Labia, perineum, vagina, and cervix inspected and found to be intact.    APGAR: 8,9 ; weight not yet recorded.   Cord: 3VC with the following complications: N/A.    Anesthesia:  Epidural Episiotomy: None Lacerations: Intact Est. Blood Loss (mL):  Mom to postpartum.  Baby to Couplet care / Skin to Skin.  Moody Bruins, MD Resident Physician 05/28/2022 4:32 PM    Fellow ATTESTATION  I was present and gloved for this delivery and agree with the above documentation in the resident's note except as below.   Celedonio Savage, MD [facility] (Faculty Practice) 05/28/2022, 7:07 PM

## 2021-11-29 ENCOUNTER — Emergency Department (HOSPITAL_COMMUNITY)
Admission: EM | Admit: 2021-11-29 | Discharge: 2021-11-29 | Disposition: A | Discharge status: Home / Self Care | Payer: Medicaid Other | Attending: Emergency Medicine | Admitting: Emergency Medicine

## 2021-11-29 ENCOUNTER — Other Ambulatory Visit: Payer: Self-pay

## 2021-11-29 ENCOUNTER — Emergency Department (HOSPITAL_COMMUNITY): Payer: Medicaid Other

## 2021-11-29 ENCOUNTER — Encounter (HOSPITAL_COMMUNITY): Payer: Self-pay | Admitting: Emergency Medicine

## 2021-11-29 DIAGNOSIS — O99511 Diseases of the respiratory system complicating pregnancy, first trimester: Secondary | ICD-10-CM | POA: Diagnosis not present

## 2021-11-29 DIAGNOSIS — Z3A12 12 weeks gestation of pregnancy: Secondary | ICD-10-CM | POA: Insufficient documentation

## 2021-11-29 DIAGNOSIS — O208 Other hemorrhage in early pregnancy: Secondary | ICD-10-CM | POA: Diagnosis not present

## 2021-11-29 DIAGNOSIS — O4690 Antepartum hemorrhage, unspecified, unspecified trimester: Secondary | ICD-10-CM

## 2021-11-29 DIAGNOSIS — O209 Hemorrhage in early pregnancy, unspecified: Secondary | ICD-10-CM | POA: Diagnosis present

## 2021-11-29 DIAGNOSIS — N939 Abnormal uterine and vaginal bleeding, unspecified: Secondary | ICD-10-CM

## 2021-11-29 DIAGNOSIS — J45909 Unspecified asthma, uncomplicated: Secondary | ICD-10-CM | POA: Insufficient documentation

## 2021-11-29 DIAGNOSIS — O4691 Antepartum hemorrhage, unspecified, first trimester: Secondary | ICD-10-CM | POA: Diagnosis not present

## 2021-11-29 DIAGNOSIS — Z3A13 13 weeks gestation of pregnancy: Secondary | ICD-10-CM | POA: Diagnosis not present

## 2021-11-29 LAB — COMPREHENSIVE METABOLIC PANEL
ALT: 8 U/L (ref 0–44)
AST: 13 U/L — ABNORMAL LOW (ref 15–41)
Albumin: 3.8 g/dL (ref 3.5–5.0)
Alkaline Phosphatase: 46 U/L (ref 38–126)
Anion gap: 8 (ref 5–15)
BUN: 8 mg/dL (ref 6–20)
CO2: 24 mmol/L (ref 22–32)
Calcium: 9.2 mg/dL (ref 8.9–10.3)
Chloride: 103 mmol/L (ref 98–111)
Creatinine, Ser: 0.53 mg/dL (ref 0.44–1.00)
GFR, Estimated: >60 mL/min (ref >60)
Glucose, Bld: 87 mg/dL (ref 70–99)
Potassium: 3.9 mmol/L (ref 3.5–5.1)
Sodium: 135 mmol/L (ref 135–145)
Total Bilirubin: 0.2 mg/dL — ABNORMAL LOW (ref 0.3–1.2)
Total Protein: 7.5 g/dL (ref 6.5–8.1)

## 2021-11-29 LAB — URINALYSIS, ROUTINE W REFLEX MICROSCOPIC
Bilirubin Urine: NEGATIVE
Glucose, UA: NEGATIVE mg/dL
Ketones, ur: 5 mg/dL — AB
Nitrite: NEGATIVE
Protein, ur: 30 mg/dL — AB
Specific Gravity, Urine: 1.028 (ref 1.005–1.030)
pH: 6 (ref 5.0–8.0)

## 2021-11-29 LAB — CBC WITH DIFFERENTIAL/PLATELET
Abs Immature Granulocytes: 0.01 10*3/uL (ref 0.00–0.07)
Basophils Absolute: 0 10*3/uL (ref 0.0–0.1)
Basophils Relative: 0 %
Eosinophils Absolute: 0.1 10*3/uL (ref 0.0–0.5)
Eosinophils Relative: 1 %
HCT: 31.5 % — ABNORMAL LOW (ref 36.0–46.0)
Hemoglobin: 9.6 g/dL — ABNORMAL LOW (ref 12.0–15.0)
Immature Granulocytes: 0 %
Lymphocytes Relative: 26 %
Lymphs Abs: 1.9 10*3/uL (ref 0.7–4.0)
MCH: 23.5 pg — ABNORMAL LOW (ref 26.0–34.0)
MCHC: 30.5 g/dL (ref 30.0–36.0)
MCV: 77 fL — ABNORMAL LOW (ref 80.0–100.0)
Monocytes Absolute: 0.5 10*3/uL (ref 0.1–1.0)
Monocytes Relative: 7 %
Neutro Abs: 4.8 10*3/uL (ref 1.7–7.7)
Neutrophils Relative %: 66 %
Platelets: 406 10*3/uL — ABNORMAL HIGH (ref 150–400)
RBC: 4.09 MIL/uL (ref 3.87–5.11)
RDW: 21.2 % — ABNORMAL HIGH (ref 11.5–15.5)
WBC: 7.3 10*3/uL (ref 4.0–10.5)
nRBC: 0 % (ref 0.0–0.2)

## 2021-11-29 LAB — WET PREP, GENITAL
Sperm: NONE SEEN
WBC, Wet Prep HPF POC: >=10 — AB (ref <10)
Yeast Wet Prep HPF POC: NONE SEEN

## 2021-11-29 LAB — HCG, QUANTITATIVE, PREGNANCY: hCG, Beta Chain, Quant, S: 56997 m[IU]/mL — ABNORMAL HIGH (ref <5)

## 2021-11-29 MED ORDER — ACETAMINOPHEN 325 MG PO TABS
650.0000 mg | ORAL_TABLET | Freq: Once | ORAL | Status: AC
  Administered 2021-11-29: 650 mg via ORAL
  Filled 2021-11-29: qty 2

## 2021-11-29 MED ORDER — DOXYLAMINE-PYRIDOXINE 10-10 MG PO TBEC
1.0000 | DELAYED_RELEASE_TABLET | Freq: Three times a day (TID) | ORAL | 0 refills | Status: DC | PRN
Start: 2021-11-29 — End: 2022-05-30

## 2021-11-29 NOTE — ED Notes (Signed)
Assisted MD with pelvic cart ? ?

## 2021-11-29 NOTE — ED Provider Notes (Signed)
?[facility] DEPART[facility] ?Provider Note ? ? ?CSN: [id number] ?Arrival date & time: 11/29/21  1029 ? ?  ? ?History ? ?Chief Complaint  ?Patient presents with  ? Vaginal Bleeding  ? ? ?Mallory Willis is a 26 y.o. female. ? ? ?Vaginal Bleeding ?Patient is a G27, P15 26 year old female who presents for vaginal bleeding.  Onset was this morning.  She reports passage of dark blood and 1 clot.  She has gone through 1 pad thus far today.  She denies any symptoms of lightheadedness or dizziness.  She endorses mild lower abdominal cramping.  Pain does not radiate.  She reports home pregnancy test that was + 3 weeks ago.  LMP was in January.  Medical history includes asthma and chronic back pain.  Per chart review, blood type is O+. ?  ? ?Home Medications ?Prior to Admission medications   ?Medication Sig Start Date End Date Taking? Authorizing Provider  ?Doxylamine-Pyridoxine 10-10 MG TBEC Take 1 tablet by mouth every 8 (eight) hours as needed. 11/29/21  Yes Gloris Manchester, MD  ?albuterol (VENTOLIN HFA) 108 (90 Base) MCG/ACT inhaler Inhale 1-2 puffs into the lungs every 6 (six) hours as needed for wheezing or shortness of breath.    [provider]  ?benzonatate (TESSALON) 100 MG capsule Take 1 capsule (100 mg total) by mouth every 8 (eight) hours. 04/30/20   Joy, Shawn C, PA-C  ?cetirizine (ZYRTEC) 10 MG tablet Take 10 mg by mouth as needed.     [provider]  ?clindamycin (CLEOCIN) 150 MG capsule Take 3 capsules (450 mg total) by mouth 3 (three) times daily. 08/20/20   Burgess Amor, PA-C  ?megestrol (MEGACE) 40 MG tablet Take 1 tablet (40 mg total) by mouth 3 (three) times daily. Til bleeding stops then once daily til completed. 10/17/19   Tilda Burrow, MD  ?ondansetron (ZOFRAN) 4 MG tablet Take 1 tablet (4 mg total) by mouth every 6 (six) hours. 07/30/20   Triplett, Tammy, PA-C  ?oxyCODONE (ROXICODONE) 5 MG immediate release tablet Take 1 tablet (5 mg total) by mouth every 4 (four) hours as needed for  severe pain. 02/20/21   Sabas Sous, MD  ?oxyCODONE-acetaminophen (PERCOCET/ROXICET) 5-325 MG tablet Take 1 tablet by mouth every 4 (four) hours as needed. 08/20/20   Burgess Amor, PA-C  ?   ? ?Allergies    ?Hydrocodone, Amoxicillin, Ceclor [cefaclor], Nsaids, Red dye, and Ultram [tramadol]   ? ?Review of Systems   ?Review of Systems  ?Genitourinary:  Positive for pelvic pain and vaginal bleeding.  ?All other systems reviewed and are negative. ? ?Physical Exam ?Updated Vital Signs ?BP 128/74   Pulse 68   Temp 98.1 ?F (36.7 ?C) (Oral)   Resp 18   Ht 5\' 9"  (1.753 m)   Wt 90.7 kg   LMP 01/19/2021 (Approximate)   SpO2 100%   BMI 29.53 kg/m?  ?Physical Exam ?Vitals and nursing note reviewed. Exam conducted with a chaperone present.  ?Constitutional:   ?   General: She is not in acute distress. ?   Appearance: Normal appearance. She is well-developed. She is not ill-appearing, toxic-appearing or diaphoretic.  ?HENT:  ?   Head: Normocephalic and atraumatic.  ?   Right Ear: External ear normal.  ?   Left Ear: External ear normal.  ?   Nose: Nose normal.  ?   Mouth/Throat:  ?   Mouth: Mucous membranes are moist.  ?   Pharynx: Oropharynx is clear.  ?Eyes:  ?  Extraocular Movements: Extraocular movements intact.  ?   Conjunctiva/sclera: Conjunctivae normal.  ?Cardiovascular:  ?   Rate and Rhythm: Normal rate and regular rhythm.  ?   Heart sounds: No murmur heard. ?Pulmonary:  ?   Effort: Pulmonary effort is normal. No respiratory distress.  ?Abdominal:  ?   Palpations: Abdomen is soft.  ?   Tenderness: There is abdominal tenderness (mild, suprapubic area). There is no right CVA tenderness, left CVA tenderness, guarding or rebound.  ?Genitourinary: ?   Exam position: Lithotomy position.  ?   Vagina: Normal. No foreign body. No lesions.  ?   Cervix: Normal.  ?   Comments: Closed os, no active bleeding ?Musculoskeletal:     ?   General: No swelling.  ?   Cervical back: Normal range of motion and neck supple.  ?   Right  lower leg: No edema.  ?   Left lower leg: No edema.  ?Skin: ?   General: Skin is warm and dry.  ?   Capillary Refill: Capillary refill takes less than 2 seconds.  ?Neurological:  ?   General: No focal deficit present.  ?   Mental Status: She is alert and oriented to person, place, and time.  ?   Cranial Nerves: No cranial nerve deficit.  ?   Sensory: No sensory deficit.  ?   Motor: No weakness.  ?   Coordination: Coordination normal.  ?Psychiatric:     ?   Mood and Affect: Mood normal.     ?   Behavior: Behavior normal.     ?   Thought Content: Thought content normal.     ?   Judgment: Judgment normal.  ? ? ?ED Results / Procedures / Treatments   ?Labs ?(all labs ordered are listed, but only abnormal results are displayed) ?Labs Reviewed  ?WET PREP, GENITAL - Abnormal; Notable for the following components:  ?    Result Value  ? Trich, Wet Prep PRESENT (*)   ? Clue Cells Wet Prep HPF POC PRESENT (*)   ? WBC, Wet Prep HPF POC >=10 (*)   ? All other components within normal limits  ?CBC WITH DIFFERENTIAL/PLATELET - Abnormal; Notable for the following components:  ? Hemoglobin 9.6 (*)   ? HCT 31.5 (*)   ? MCV 77.0 (*)   ? MCH 23.5 (*)   ? RDW 21.2 (*)   ? Platelets 406 (*)   ? All other components within normal limits  ?COMPREHENSIVE METABOLIC PANEL - Abnormal; Notable for the following components:  ? AST 13 (*)   ? Total Bilirubin 0.2 (*)   ? All other components within normal limits  ?HCG, QUANTITATIVE, PREGNANCY - Abnormal; Notable for the following components:  ? hCG, Beta Francene FindersChain, Quant, S [phone] (*)   ? All other components within normal limits  ?URINALYSIS, ROUTINE W REFLEX MICROSCOPIC - Abnormal; Notable for the following components:  ? Hgb urine dipstick MODERATE (*)   ? Ketones, ur 5 (*)   ? Protein, ur 30 (*)   ? Leukocytes,Ua TRACE (*)   ? Bacteria, UA RARE (*)   ? All other components within normal limits  ?GC/CHLAMYDIA PROBE AMP ([place]  ? ? ?EKG ?None ? ?Radiology ?US OB Comp Less 14  Wks ? ?Result Date: 11/29/2021 ?CLINICAL DATA:  Pregnant patient with vaginal bleeding this morning. EXAM: OBSTETRIC <14 WK ULTRASOUND TECHNIQUE: Transabdominal ultrasound was performed for evaluation of the gestation as well as the maternal uterus  and adnexal regions. COMPARISON:  None. FINDINGS: Intrauterine gestational sac: Single Yolk sac:  Visualized. Embryo:  Visualized. Cardiac Activity: Visualized. Heart Rate: 157 bpm MSD:    mm    w     d CRL: 62.7 mm 12 w 5 d [place] EDC: June 08, 2022 Subchorionic hemorrhage: A moderate subchorionic hemorrhage is identified inferior to the gestational sac. Maternal uterus/adnexae: The right ovary is normal. The left ovary was not visualized. IMPRESSION: 1. Single live IUP with a moderate subchorionic hemorrhage. Electronically Signed   By: Gerome Sam III M.D.   On: 11/29/2021 13:36   ? ?Procedures ?Procedures  ? ? ?Medications Ordered in ED ?Medications  ?acetaminophen (TYLENOL) tablet 650 mg (650 mg Oral Given 11/29/21 1101)  ? ? ?ED Course/ Medical Decision Making/ A&P ?  ?                        ?Medical Decision Making ?Amount and/or Complexity of Data Reviewed ?Labs: ordered. ?Radiology: ordered. ? ?Risk ?OTC drugs. ?Prescription drug management. ? ? ?This patient presents to the ED for concern of vaginal bleeding, this involves an extensive number of treatment options, and is a complaint that carries with it a high risk of complications and morbidity.  The differential diagnosis includes ectopic pregnancy, miscarriage, subchorionic hemorrhage, laceration ? ? ?Co morbidities that complicate the patient evaluation ? ?asthma and chronic back pain ? ? ?Additional history obtained: ? ?Additional history obtained from N/A ?External records from outside source obtained and reviewed including EMR ? ? ?Lab Tests: ? ?I Ordered, and personally interpreted labs.  The pertinent results include: Baseline microcytic anemia, no leukocytosis, hCG well above discriminatory  zone ? ? ?Imaging Studies ordered: ? ?I ordered imaging studies including first trimester ultrasound ?I independently visualized and interpreted imaging which showed viable IUP with presence of subchorionic hematoma ?I agree

## 2021-11-29 NOTE — Discharge Instructions (Addendum)
[facility] for your vaginal bleeding and your positive trichomonas.  Take it easy and rest until you see them ?

## 2021-11-29 NOTE — ED Notes (Signed)
Pt made aware we need UA sample ?

## 2021-11-29 NOTE — ED Triage Notes (Signed)
Pt reports positive home pregnancy test last month (2/21). Pt started having vaginal bleeding this morning. Endorses mild lower abd cramping.  ?

## 2021-12-01 ENCOUNTER — Telehealth: Payer: Self-pay | Admitting: Obstetrics & Gynecology

## 2021-12-01 LAB — GC/CHLAMYDIA PROBE AMP (~~LOC~~) NOT AT ARMC
Chlamydia: POSITIVE — AB
Comment: NEGATIVE
Comment: NORMAL
Neisseria Gonorrhea: NEGATIVE

## 2021-12-01 NOTE — Telephone Encounter (Signed)
Patient went to ed over the weekend and said they told her to f/u with [place] I can't find any follow up notes. Please advise on what appointment is needed.  ?

## 2021-12-05 ENCOUNTER — Ambulatory Visit: Payer: Self-pay | Admitting: *Deleted

## 2021-12-05 ENCOUNTER — Other Ambulatory Visit (HOSPITAL_COMMUNITY)
Admission: RE | Admit: 2021-12-05 | Discharge: 2021-12-05 | Disposition: A | Discharge status: Home / Self Care | Payer: Medicaid Other | Source: Ambulatory Visit | Attending: Advanced Practice Midwife | Admitting: Advanced Practice Midwife

## 2021-12-05 ENCOUNTER — Ambulatory Visit (INDEPENDENT_AMBULATORY_CARE_PROVIDER_SITE_OTHER): Payer: Self-pay | Admitting: Advanced Practice Midwife

## 2021-12-05 ENCOUNTER — Other Ambulatory Visit: Payer: Self-pay

## 2021-12-05 ENCOUNTER — Encounter: Payer: Self-pay | Admitting: Advanced Practice Midwife

## 2021-12-05 VITALS — BP 124/78 | HR 79 | Wt 217.0 lb

## 2021-12-05 DIAGNOSIS — O99330 Smoking (tobacco) complicating pregnancy, unspecified trimester: Secondary | ICD-10-CM | POA: Insufficient documentation

## 2021-12-05 DIAGNOSIS — Z3A13 13 weeks gestation of pregnancy: Secondary | ICD-10-CM | POA: Diagnosis not present

## 2021-12-05 DIAGNOSIS — Z363 Encounter for antenatal screening for malformations: Secondary | ICD-10-CM

## 2021-12-05 DIAGNOSIS — O98812 Other maternal infectious and parasitic diseases complicating pregnancy, second trimester: Secondary | ICD-10-CM

## 2021-12-05 DIAGNOSIS — Z3480 Encounter for supervision of other normal pregnancy, unspecified trimester: Secondary | ICD-10-CM | POA: Diagnosis not present

## 2021-12-05 DIAGNOSIS — Z8759 Personal history of other complications of pregnancy, childbirth and the puerperium: Secondary | ICD-10-CM

## 2021-12-05 DIAGNOSIS — Z31430 Encounter of female for testing for genetic disease carrier status for procreative management: Secondary | ICD-10-CM | POA: Diagnosis not present

## 2021-12-05 DIAGNOSIS — O23592 Infection of other part of genital tract in pregnancy, second trimester: Secondary | ICD-10-CM

## 2021-12-05 DIAGNOSIS — O99332 Smoking (tobacco) complicating pregnancy, second trimester: Secondary | ICD-10-CM

## 2021-12-05 DIAGNOSIS — A5901 Trichomonal vulvovaginitis: Secondary | ICD-10-CM | POA: Insufficient documentation

## 2021-12-05 DIAGNOSIS — Z98891 History of uterine scar from previous surgery: Secondary | ICD-10-CM | POA: Insufficient documentation

## 2021-12-05 DIAGNOSIS — Z124 Encounter for screening for malignant neoplasm of cervix: Secondary | ICD-10-CM | POA: Insufficient documentation

## 2021-12-05 DIAGNOSIS — Z3490 Encounter for supervision of normal pregnancy, unspecified, unspecified trimester: Secondary | ICD-10-CM | POA: Insufficient documentation

## 2021-12-05 DIAGNOSIS — A749 Chlamydial infection, unspecified: Secondary | ICD-10-CM | POA: Insufficient documentation

## 2021-12-05 LAB — POCT URINALYSIS DIPSTICK OB
Blood, UA: NEGATIVE
Glucose, UA: NEGATIVE
Ketones, UA: NEGATIVE
Nitrite, UA: NEGATIVE
POC,PROTEIN,UA: NEGATIVE

## 2021-12-05 MED ORDER — METRONIDAZOLE 500 MG PO TABS: 500.0000 mg | ORAL_TABLET | Freq: Two times a day (BID) | ORAL | 0 refills | Status: DC

## 2021-12-05 MED ORDER — AZITHROMYCIN 500 MG PO TABS: 1000.0000 mg | ORAL_TABLET | Freq: Once | ORAL | 0 refills | Status: AC

## 2021-12-05 MED ORDER — ASPIRIN 81 MG PO CHEW: 162.0000 mg | CHEWABLE_TABLET | Freq: Every day | ORAL | 7 refills | Status: DC

## 2021-12-05 NOTE — Progress Notes (Signed)
? ? ?INITIAL OBSTETRICAL VISIT ?Patient name: Mallory Willis Glynn MRN [id number]  Date of birth: Dec 01, 1995 ?Chief Complaint:   ?Initial Prenatal Visit ? ?History of Present Illness:   ?Mallory Willis is a 26 y.o. 582P1001 African-American female at 3847w4d by US at 12.5 weeks with an Estimated Date of Delivery: 06/08/22 being seen today for her initial obstetrical visit.   ?Patient's last menstrual period was 01/19/2021 (approximate). ?Her obstetrical history is significant for  hx gHTN, C/S for arrest of descent .   ?Today she reports  feeling well; had [facility] visit on 11/29/21 for [facility]- no further spotting since 12/02/21 .  ?Last pap around 2018. Results were:  neg per pt report ? ? ?  12/05/2021  ?  9:13 AM 08/14/2016  ?  1:27 PM  ?Depression screen PHQ 2/9  ?Decreased Interest 0 0  ?Down, Depressed, Hopeless 0 0  ?PHQ - 2 Score 0 0  ?Altered sleeping 0 0  ?Tired, decreased energy 1 0  ?Change in appetite 1 2  ?Feeling bad or failure about yourself  0 0  ?Trouble concentrating 0 0  ?Moving slowly or fidgety/restless 0 0  ?Suicidal thoughts 0 0  ?PHQ-9 Score 2 2  ? ?  ? ?  12/05/2021  ?  9:14 AM  ?GAD 7 : Generalized Anxiety Score  ?Nervous, Anxious, on Edge 0  ?Control/stop worrying 0  ?Worry too much - different things 0  ?Trouble relaxing 0  ?Restless 0  ?Easily annoyed or irritable 1  ?Afraid - awful might happen 0  ?Total GAD 7 Score 1  ? ? ? ?Review of Systems:   ?Pertinent items are noted in HPI ?Denies cramping/contractions, leakage of fluid, vaginal bleeding, abnormal vaginal discharge w/ itching/odor/irritation, headaches, visual changes, shortness of breath, chest pain, abdominal pain, severe nausea/vomiting, or problems with urination or bowel movements unless otherwise stated above.  ?Pertinent History Reviewed:  ?Reviewed past medical,surgical, social, obstetrical and family history.  ?Reviewed problem list, medications and allergies. ?OB History  ?Gravida Para Term Preterm AB Living  ?2 1 1      1   ?SAB IAB Ectopic Multiple Live Births  ?      0 1  ?  ?# Outcome Date GA Lbr Len/2nd Weight Sex Delivery Anes PTL Lv  ?2 Current           ?1 Term 04/10/17 1341w3d  7 lb 13.8 oz (3.565 kg) F CS-LTranv EPI  LIV  ?   Complications: Failure to Progress in First Stage, Gestational hypertension  ? ?Physical Assessment:  ? ?Vitals:  ? 12/05/21 0859  ?BP: 124/78  ?Pulse: 79  ?Weight: 217 lb (98.4 kg)  ?Body mass index is 32.05 kg/m?. ? ?     Physical Examination: ? General appearance - well appearing, and in no distress ? Mental status - alert, oriented to person, place, and time ? Psych:  She has a normal mood and affect ? Skin - warm and dry, normal color, no suspicious lesions noted ? Chest - effort normal, all lung fields clear to auscultation bilaterally ? Heart - normal rate and regular rhythm ? Abdomen - soft, nontender ? Extremities:  No swelling or varicosities noted ? Pelvic - VULVA: normal appearing vulva with no masses, tenderness or lesions  VAGINA: normal appearing vagina with normal color and pinkish discharge, no lesions  CERVIX: normal appearing cervix without discharge or lesions, no CMT ? Thin prep pap is done without HR HPV cotesting ? ?  Chaperone: Malachy Mood   ? ?TODAY'S NT (too late) ? ?Results for orders placed or performed in visit on 12/05/21 (from the past 24 hour(s))  ?POC Urinalysis Dipstick OB  ? Collection Time: 12/05/21  9:45 AM  ?Result Value Ref Range  ? Color, UA    ? Clarity, UA    ? Glucose, UA Negative Negative  ? Bilirubin, UA    ? Ketones, UA neg   ? Spec Grav, UA    ? Blood, UA neg   ? pH, UA    ? POC,PROTEIN,UA Negative Negative, Trace, Small (1+), Moderate (2+), Large (3+), 4+  ? Urobilinogen, UA    ? Nitrite, UA neg   ? Leukocytes, UA Trace (A) Negative  ? Appearance    ? Odor    ?  ?Assessment & Plan:  ?1) Low-Risk Pregnancy G2P1001 at [redacted]w[redacted]d with an Estimated Date of Delivery: 06/08/22  ? ?2) Initial OB visit ? ?3) Hx gHTN, rx bASA 162mg  daily; baseline labs collected ? ?4) Mod  [facility] dx 11/29/21, follow at next u/s ? ?5) +GC/trich 11/29/21, no treatment given as ED was unsure what meds were safe in preg, rx azithromycin 1gm x 1 and MTX 500mg  bid x 7d; EPT given to partner 12/01/21 (doxy 100mg  bid x 7d) ? ?6) Prev C/S for AOD, leaning towards TOLAC, consent copy given to keep and review  ? ?Meds:  ?Meds ordered this encounter  ?Medications  ? aspirin 81 MG chewable tablet  ?  Sig: Chew 2 tablets (162 mg total) by mouth daily.  ?  Dispense:  60 tablet  ?  Refill:  7  ?  Order Specific Question:   Supervising Provider  ?  Answer:   H [[id number]]  ? azithromycin (ZITHROMAX) 500 MG tablet  ?  Sig: Take 2 tablets (1,000 mg total) by mouth once for 1 dose.  ?  Dispense:  2 tablet  ?  Refill:  0  ?  Order Specific Question:   Supervising Provider  ?  Answer:   Danelle Earthly H [[id number]]  ? metroNIDAZOLE (FLAGYL) 500 MG tablet  ?  Sig: Take 1 tablet (500 mg total) by mouth 2 (two) times daily.  ?  Dispense:  14 tablet  ?  Refill:  0  ?  Order Specific Question:   Supervising Provider  ?  Answer:   H [[id number]]  ? ? ?Initial labs obtained ?Continue prenatal vitamins ?Reviewed n/v relief measures and warning s/s to report ?Reviewed recommended weight gain based on pre-gravid BMI ?Encouraged well-balanced diet ?Genetic & carrier screening discussed: requests Panorama, AFP, and Horizon , too late for NT/IT ?Ultrasound discussed; fetal survey: requested ?CCNC completed> form faxed if has or is planning to apply for medicaid ?The nature of [facility] with multiple MDs and other Advanced Practice Providers was explained to patient; also emphasized that fellows, residents, and students are part of our team. ?Does not have home bp cuff. Office bp cuff given: no. Rx sent: yes-will order at next visit. Check bp weekly, let Duane Lope know if consistently >140/90.  ? ?Indications for ASA therapy (per uptodate) ?One of the following: ?H/O preeclampsia, especially early onset/adverse  outcome Yes (gHTN) ? ?No indications for early A1C (per uptodate) ? ?Follow-up: Return in about 4 weeks (around 01/02/2022) for LROB with AFP; then 6-7wks LROB with anatomy u/s.  ? ?Orders Placed This Encounter  ?Procedures  ? Urine Culture  ? [facility] OB Comp +  14 Wk  ? Comprehensive metabolic panel  ? CBC/D/Plt+RPR+Rh+ABO+RubIgG...  ? Protein / creatinine ratio, urine  ? Genetic Screening  ? POC Urinalysis Dipstick OB  ? ? ?Arabella Merles CNM ?12/05/2021 ?10:35 AM  ?

## 2021-12-05 NOTE — Patient Instructions (Signed)
Mallory Willis, thank you for choosing our office today! We appreciate the opportunity to meet your healthcare needs. You may receive a short survey by mail, e-mail, or through [employer]. If you are happy with your care we would appreciate if you could take just a few minutes to complete the survey questions. We read all of your comments and take your feedback very seriously. Thank you again for choosing our office.  ?Center for [employer] Team at [facility] ? [facility] ?([address] [place], [place] [address]) ?Entrance C, located off of [address] ?Free 24/7 valet parking  ? Nausea & Vomiting ?Have saltine crackers or pretzels by your bed and eat a few bites before you raise your head out of bed in the morning ?Eat small frequent meals throughout the day instead of large meals ?Drink plenty of fluids throughout the day to stay hydrated, just don't drink a lot of fluids with your meals.  This can make your stomach fill up faster making you feel sick ?Do not brush your teeth right after you eat ?Products with real ginger are good for nausea, like ginger ale and ginger hard candy Make sure it says made with real ginger! ?Sucking on sour candy like lemon heads is also good for nausea ?If your prenatal vitamins make you nauseated, take them at night so you will sleep through the nausea ?Sea Bands ?If you feel like you need medicine for the nausea & vomiting please let us know ?If you are unable to keep any fluids or food down please let us know ? ? Constipation ?Drink plenty of fluid, preferably water, throughout the day ?Eat foods high in fiber such as fruits, vegetables, and grains ?Exercise, such as walking, is a good way to keep your bowels regular ?Drink warm fluids, especially warm prune juice, or decaf coffee ?Eat a 1/2 cup of real oatmeal (not instant), 1/2 cup applesauce, and 1/2-1 cup warm prune juice every day ?If needed, you may take Colace (docusate sodium) stool softener  once or twice a day to help keep the stool soft.  ?If you still are having problems with constipation, you may take Miralax once daily as needed to help keep your bowels regular.  ? ?Home Blood Pressure Monitoring for Patients  ? ?Your provider has recommended that you check your blood pressure (BP) at least once a week at home. If you do not have a blood pressure cuff at home, one will be provided for you. Contact your provider if you have not received your monitor within 1 week.  ? ?Helpful Tips for Accurate Home Blood Pressure Checks  ?Don't smoke, exercise, or drink caffeine 30 minutes before checking your BP ?Use the restroom before checking your BP (a full bladder can raise your pressure) ?Relax in a comfortable upright chair ?Feet on the ground ?Left arm resting comfortably on a flat surface at the level of your heart ?Legs uncrossed ?Back supported ?Sit quietly and don't talk ?Place the cuff on your bare arm ?Adjust snuggly, so that only two fingertips can fit between your skin and the top of the cuff ?Check 2 readings separated by at least one minute ?Keep a log of your BP readings ?For a visual, please reference this diagram: http://ccnc.care/bpdiagram ? ?Provider Name: [facility]     Phone: [phone] ? ?Zone 1: ALL CLEAR  ?Continue to monitor your symptoms:  ?BP reading is less than 140 (top number) or less than 90 (bottom  number)  ?No right upper stomach pain ?No headaches or seeing spots ?No feeling nauseated or throwing up ?No swelling in face and hands ? ?Zone 2: CAUTION ?Call your doctor's office for any of the following:  ?BP reading is greater than 140 (top number) or greater than 90 (bottom number)  ?Stomach pain under your ribs in the middle or right side ?Headaches or seeing spots ?Feeling nauseated or throwing up ?Swelling in face and hands ? ?Zone 3: EMERGENCY  ?Seek immediate medical care if you have any of the following:  ?BP reading is greater than160 (top number) or greater than  110 (bottom number) ?Severe headaches not improving with Tylenol ?Serious difficulty catching your breath ?Any worsening symptoms from Zone 2  ? ? First Trimester of Pregnancy ?The first trimester of pregnancy is from week 1 until the end of week 12 (months 1 through 3). A week after a sperm fertilizes an egg, the egg will implant on the wall of the uterus. This embryo will begin to develop into a baby. Genes from you and your partner are forming the baby. The female genes determine whether the baby is a boy or a girl. At 6-8 weeks, the eyes and face are formed, and the heartbeat can be seen on ultrasound. At the end of 12 weeks, all the baby's organs are formed.  ?Now that you are pregnant, you will want to do everything you can to have a healthy baby. Two of the most important things are to get good prenatal care and to follow your health care provider's instructions. Prenatal care is all the medical care you receive before the baby's birth. This care will help prevent, find, and treat any problems during the pregnancy and childbirth. ?BODY CHANGES ?Your body goes through many changes during pregnancy. The changes vary from woman to woman.  ?You may gain or lose a couple of pounds at first. ?You may feel sick to your stomach (nauseous) and throw up (vomit). If the vomiting is uncontrollable, call your health care provider. ?You may tire easily. ?You may develop headaches that can be relieved by medicines approved by your health care provider. ?You may urinate more often. Painful urination may mean you have a bladder infection. ?You may develop heartburn as a result of your pregnancy. ?You may develop constipation because certain hormones are causing the muscles that push waste through your intestines to slow down. ?You may develop hemorrhoids or swollen, bulging veins (varicose veins). ?Your breasts may begin to grow larger and become tender. Your nipples may stick out more, and the tissue that surrounds them  (areola) may become darker. ?Your gums may bleed and may be sensitive to brushing and flossing. ?Dark spots or blotches (chloasma, mask of pregnancy) may develop on your face. This will likely fade after the baby is born. ?Your menstrual periods will stop. ?You may have a loss of appetite. ?You may develop cravings for certain kinds of food. ?You may have changes in your emotions from day to day, such as being excited to be pregnant or being concerned that something may go wrong with the pregnancy and baby. ?You may have more vivid and strange dreams. ?You may have changes in your hair. These can include thickening of your hair, rapid growth, and changes in texture. Some women also have hair loss during or after pregnancy, or hair that feels dry or thin. Your hair will most likely return to normal after your baby is born. ?WHAT TO EXPECT AT YOUR PRENATAL  VISITS ?During a routine prenatal visit: ?You will be weighed to make sure you and the baby are growing normally. ?Your blood pressure will be taken. ?Your abdomen will be measured to track your baby's growth. ?The fetal heartbeat will be listened to starting around week 10 or 12 of your pregnancy. ?Test results from any previous visits will be discussed. ?Your health care provider may ask you: ?How you are feeling. ?If you are feeling the baby move. ?If you have had any abnormal symptoms, such as leaking fluid, bleeding, severe headaches, or abdominal cramping. ?If you have any questions. ?Other tests that may be performed during your first trimester include: ?Blood tests to find your blood type and to check for the presence of any previous infections. They will also be used to check for low iron levels (anemia) and Rh antibodies. Later in the pregnancy, blood tests for diabetes will be done along with other tests if problems develop. ?Urine tests to check for infections, diabetes, or protein in the urine. ?An ultrasound to confirm the proper growth and development  of the baby. ?An amniocentesis to check for possible genetic problems. ?Fetal screens for spina bifida and Down syndrome. ?You may need other tests to make sure you and the baby are doing well. ?HOME CARE

## 2021-12-06 ENCOUNTER — Encounter: Payer: Self-pay | Admitting: Advanced Practice Midwife

## 2021-12-06 ENCOUNTER — Other Ambulatory Visit: Payer: Self-pay | Admitting: Advanced Practice Midwife

## 2021-12-06 DIAGNOSIS — O99019 Anemia complicating pregnancy, unspecified trimester: Secondary | ICD-10-CM | POA: Insufficient documentation

## 2021-12-06 MED ORDER — FERROUS FUMARATE 325 (106 FE) MG PO TABS: 1.0000 | ORAL_TABLET | ORAL | 4 refills | Status: DC

## 2021-12-07 LAB — COMPREHENSIVE METABOLIC PANEL
ALT: 4 IU/L (ref 0–32)
AST: 11 IU/L (ref 0–40)
Albumin/Globulin Ratio: 1.8 (ref 1.2–2.2)
Albumin: 4.2 g/dL (ref 3.9–5.0)
Alkaline Phosphatase: 53 IU/L (ref 44–121)
BUN/Creatinine Ratio: 10 (ref 9–23)
BUN: 6 mg/dL (ref 6–20)
Bilirubin Total: <0.2 mg/dL (ref 0.0–1.2)
CO2: 19 mmol/L — ABNORMAL LOW (ref 20–29)
Calcium: 9.4 mg/dL (ref 8.7–10.2)
Chloride: 103 mmol/L (ref 96–106)
Creatinine, Ser: 0.6 mg/dL (ref 0.57–1.00)
Globulin, Total: 2.4 g/dL (ref 1.5–4.5)
Glucose: 82 mg/dL (ref 70–99)
Potassium: 3.9 mmol/L (ref 3.5–5.2)
Sodium: 136 mmol/L (ref 134–144)
Total Protein: 6.6 g/dL (ref 6.0–8.5)
eGFR: 128 mL/min/{1.73_m2} (ref >59)

## 2021-12-07 LAB — CBC/D/PLT+RPR+RH+ABO+RUBIGG...
Antibody Screen: NEGATIVE
Basophils Absolute: 0 10*3/uL (ref 0.0–0.2)
Basos: 0 %
EOS (ABSOLUTE): 0.1 10*3/uL (ref 0.0–0.4)
Eos: 1 %
HCV Ab: NONREACTIVE
HIV Screen 4th Generation wRfx: NONREACTIVE
Hematocrit: 30.6 % — ABNORMAL LOW (ref 34.0–46.6)
Hemoglobin: 9.3 g/dL — ABNORMAL LOW (ref 11.1–15.9)
Hepatitis B Surface Ag: NEGATIVE
Immature Grans (Abs): 0 10*3/uL (ref 0.0–0.1)
Immature Granulocytes: 0 %
Lymphocytes Absolute: 3 10*3/uL (ref 0.7–3.1)
Lymphs: 34 %
MCH: 22.8 pg — ABNORMAL LOW (ref 26.6–33.0)
MCHC: 30.4 g/dL — ABNORMAL LOW (ref 31.5–35.7)
MCV: 75 fL — ABNORMAL LOW (ref 79–97)
Monocytes Absolute: 0.6 10*3/uL (ref 0.1–0.9)
Monocytes: 6 %
Neutrophils Absolute: 5.1 10*3/uL (ref 1.4–7.0)
Neutrophils: 59 %
Platelets: 441 10*3/uL (ref 150–450)
RBC: 4.08 x10E6/uL (ref 3.77–5.28)
RDW: 20.7 % — ABNORMAL HIGH (ref 11.7–15.4)
RPR Ser Ql: NONREACTIVE
Rh Factor: POSITIVE
Rubella Antibodies, IGG: 1.74 index (ref >0.99)
WBC: 8.8 10*3/uL (ref 3.4–10.8)

## 2021-12-07 LAB — PROTEIN / CREATININE RATIO, URINE
Creatinine, Urine: 260.3 mg/dL
Protein, Ur: 65.6 mg/dL
Protein/Creat Ratio: 252 mg/g creat — ABNORMAL HIGH (ref 0–200)

## 2021-12-07 LAB — HCV INTERPRETATION

## 2021-12-08 LAB — URINE CULTURE: Organism ID, Bacteria: NO GROWTH

## 2021-12-08 LAB — CYTOLOGY - PAP: Diagnosis: NEGATIVE

## 2021-12-16 ENCOUNTER — Encounter: Payer: Self-pay | Admitting: Advanced Practice Midwife

## 2021-12-31 ENCOUNTER — Encounter: Payer: Medicaid Other | Admitting: Medical

## 2022-01-01 ENCOUNTER — Encounter: Payer: Self-pay | Admitting: Women's Health

## 2022-01-02 ENCOUNTER — Encounter: Payer: Medicaid Other | Admitting: Obstetrics & Gynecology

## 2022-01-05 ENCOUNTER — Encounter: Payer: Medicaid Other | Admitting: Women's Health

## 2022-01-05 ENCOUNTER — Ambulatory Visit (INDEPENDENT_AMBULATORY_CARE_PROVIDER_SITE_OTHER): Payer: Medicaid Other | Admitting: Obstetrics & Gynecology

## 2022-01-05 ENCOUNTER — Encounter: Payer: Self-pay | Admitting: Obstetrics & Gynecology

## 2022-01-05 VITALS — Wt 225.0 lb

## 2022-01-05 DIAGNOSIS — Z09 Encounter for follow-up examination after completed treatment for conditions other than malignant neoplasm: Secondary | ICD-10-CM | POA: Diagnosis not present

## 2022-01-05 DIAGNOSIS — Z113 Encounter for screening for infections with a predominantly sexual mode of transmission: Secondary | ICD-10-CM | POA: Diagnosis not present

## 2022-01-05 DIAGNOSIS — Z3A18 18 weeks gestation of pregnancy: Secondary | ICD-10-CM | POA: Diagnosis not present

## 2022-01-05 DIAGNOSIS — Z1379 Encounter for other screening for genetic and chromosomal anomalies: Secondary | ICD-10-CM

## 2022-01-05 MED ORDER — PRENATAL 19 29-1 MG PO TABS: 1.0000 | ORAL_TABLET | Freq: Every day | ORAL | 11 refills | Status: DC

## 2022-01-05 NOTE — Progress Notes (Signed)
? ?  LOW-RISK PREGNANCY VISIT ?Patient name: Mallory Willis MRN [id number]  Date of birth: [birth year] ?Chief Complaint:   ?Routine Prenatal Visit ? ?History of Present Illness:   ?Mallory Willis is a 26 y.o. G18P1001 female at [redacted]w[redacted]d with an Estimated Date of Delivery: 06/08/22 being seen today for ongoing management of a low-risk pregnancy.  ? ?  12/05/2021  ?  9:13 AM 08/14/2016  ?  1:27 PM  ?Depression screen PHQ 2/9  ?Decreased Interest 0 0  ?Down, Depressed, Hopeless 0 0  ?PHQ - 2 Score 0 0  ?Altered sleeping 0 0  ?Tired, decreased energy 1 0  ?Change in appetite 1 2  ?Feeling bad or failure about yourself  0 0  ?Trouble concentrating 0 0  ?Moving slowly or fidgety/restless 0 0  ?Suicidal thoughts 0 0  ?PHQ-9 Score 2 2  ? ? ?Today she reports no complaints. Contractions: Not present.  .  Movement: Present. denies leaking of fluid. ?Review of Systems:   ?Pertinent items are noted in HPI ?Denies abnormal vaginal discharge w/ itching/odor/irritation, headaches, visual changes, shortness of breath, chest pain, abdominal pain, severe nausea/vomiting, or problems with urination or bowel movements unless otherwise stated above. ?Pertinent History Reviewed:  ?Reviewed past medical,surgical, social, obstetrical and family history.  ?Reviewed problem list, medications and allergies. ?Physical Assessment:  ? ?Vitals:  ? 01/05/22 1412  ?Weight: 225 lb (102.1 kg)  ?Body mass index is 33.23 kg/m?. ?  ?     Physical Examination:  ? General appearance: Well appearing, and in no distress ? Mental status: Alert, oriented to person, place, and time ? Skin: Warm & dry ? Cardiovascular: Normal heart rate noted ? Respiratory: Normal respiratory effort, no distress ? Abdomen: Soft, gravid, nontender ? Pelvic: Cervical exam deferred        ? Extremities: Edema: None ? ?Fetal Status: Fetal Heart Rate (bpm): 160   Movement: Present   ? ?Chaperone: n/a   ? ?No results found for this or any previous visit (from the past 24 hour(s)).  ?Assessment &  Plan:  ?1) Low-risk pregnancy G2P1001 at [redacted]w[redacted]d with an Estimated Date of Delivery: 06/08/22  ? ?2) Fe def anemia, supplement ?  ?Meds:  ?Meds ordered this encounter  ?Medications  ? Prenatal Vit-DSS-Fe Fum-FA (PRENATAL 19) 29-1 MG TABS  ?  Sig: Take 1 tablet by mouth daily.  ?  Dispense:  100 tablet  ?  Refill:  11  ? ?Labs/procedures today: AFP ? ?Plan:  Continue routine obstetrical care  ?Next visit: prefers in person   ? ?Reviewed: Preterm labor symptoms and general obstetric precautions including but not limited to vaginal bleeding, contractions, leaking of fluid and fetal movement were reviewed in detail with the patient.  All questions were answered. Has home bp cuff. Rx faxed to . Check bp weekly, let us know if >140/90.  ? ?Follow-up: Return for keep scheduled. ? ?Orders Placed This Encounter  ?Procedures  ? GC/Chlamydia Probe Amp  ? Trichomonas vaginalis, RNA  ? AFP, Serum, Open Spina Bifida  ? ? ?Lazaro Arms, MD ?01/05/2022 ?2:44 PM ? ?

## 2022-01-08 LAB — GC/CHLAMYDIA PROBE AMP
Chlamydia trachomatis, NAA: NEGATIVE
Neisseria Gonorrhoeae by PCR: NEGATIVE

## 2022-01-08 LAB — TRICHOMONAS VAGINALIS, PROBE AMP: Trich vag by NAA: NEGATIVE

## 2022-01-22 ENCOUNTER — Telehealth: Payer: Self-pay | Admitting: *Deleted

## 2022-01-22 NOTE — Telephone Encounter (Signed)
Patient called with complaints that for the last  3 days, her upper abdomen has been hurting. Hurts before she eats and afterwards but it not able to really eat.  If she does get food down, she vomits. On Monday she had diarrhea all day. Describes as a cramping pain all across her upper abdomen under her breasts and pain lasts for several hours. She does have a history of gallstones.  Discussed with Dr Charlotta Newton and patient advised to go to [facility] for eval for gallstones and/or blockage.  Advised we would keep appointment for tomorrow as scheduled unless notified differently. Pt agreeable to plan and no further questions.  ?

## 2022-01-23 ENCOUNTER — Ambulatory Visit (INDEPENDENT_AMBULATORY_CARE_PROVIDER_SITE_OTHER): Payer: Medicaid Other

## 2022-01-23 ENCOUNTER — Encounter: Payer: Self-pay | Admitting: Obstetrics & Gynecology

## 2022-01-23 ENCOUNTER — Ambulatory Visit (INDEPENDENT_AMBULATORY_CARE_PROVIDER_SITE_OTHER): Payer: Medicaid Other | Admitting: Obstetrics & Gynecology

## 2022-01-23 VITALS — BP 112/69 | HR 85 | Wt 217.2 lb

## 2022-01-23 DIAGNOSIS — Z98891 History of uterine scar from previous surgery: Secondary | ICD-10-CM

## 2022-01-23 DIAGNOSIS — O99332 Smoking (tobacco) complicating pregnancy, second trimester: Secondary | ICD-10-CM

## 2022-01-23 DIAGNOSIS — Z3480 Encounter for supervision of other normal pregnancy, unspecified trimester: Secondary | ICD-10-CM

## 2022-01-23 DIAGNOSIS — A749 Chlamydial infection, unspecified: Secondary | ICD-10-CM

## 2022-01-23 DIAGNOSIS — Z3A20 20 weeks gestation of pregnancy: Secondary | ICD-10-CM | POA: Diagnosis not present

## 2022-01-23 DIAGNOSIS — Z363 Encounter for antenatal screening for malformations: Secondary | ICD-10-CM

## 2022-01-23 DIAGNOSIS — A5901 Trichomonal vulvovaginitis: Secondary | ICD-10-CM

## 2022-01-23 MED ORDER — BLOOD PRESSURE CUFF MISC: 1.0000 | 0 refills | Status: DC

## 2022-01-23 NOTE — Progress Notes (Signed)
[place] 123XX123 wks,cephalic,cx 2.9 cm,LVEICF 4.4 mm,FHR 150 bpm,normal ovaries,posterior/fundal placenta gr 0,EFW 372 g 52%,anatomy complete ?

## 2022-01-23 NOTE — Progress Notes (Signed)
? ?  LOW-RISK PREGNANCY VISIT ?Patient name: Mallory Willis MRN [id number]  Date of birth: [birth year] ?Chief Complaint:   ?Routine Prenatal Visit ([place] today) ? ?History of Present Illness:   ?Mallory Willis is a 26 y.o. G33P1001 female at [redacted]w[redacted]d with an Estimated Date of Delivery: 06/08/22 being seen today for ongoing management of a low-risk pregnancy.  ? ?TOLAC ?Anemia ? ? ?  12/05/2021  ?  9:13 AM 08/14/2016  ?  1:27 PM  ?Depression screen PHQ 2/9  ?Decreased Interest 0 0  ?Down, Depressed, Hopeless 0 0  ?PHQ - 2 Score 0 0  ?Altered sleeping 0 0  ?Tired, decreased energy 1 0  ?Change in appetite 1 2  ?Feeling bad or failure about yourself  0 0  ?Trouble concentrating 0 0  ?Moving slowly or fidgety/restless 0 0  ?Suicidal thoughts 0 0  ?PHQ-9 Score 2 2  ? ? ?Today she reports no complaints. Contractions: Not present. Vag. Bleeding: None.  Movement: Present. denies leaking of fluid. ?Review of Systems:   ?Pertinent items are noted in HPI ?Denies abnormal vaginal discharge w/ itching/odor/irritation, headaches, visual changes, shortness of breath, chest pain, abdominal pain, severe nausea/vomiting, or problems with urination or bowel movements unless otherwise stated above. ?Pertinent History Reviewed:  ?Reviewed past medical,surgical, social, obstetrical and family history.  ?Reviewed problem list, medications and allergies. ? ?Physical Assessment:  ? ?Vitals:  ? 01/23/22 1136  ?BP: 112/69  ?Pulse: 85  ?Weight: 217 lb 3.2 oz (98.5 kg)  ?Body mass index is 32.07 kg/m?. ?  ?     Physical Examination:  ? General appearance: Well appearing, and in no distress ? Mental status: Alert, oriented to person, place, and time ? Skin: Warm & dry ? Respiratory: Normal respiratory effort, no distress ? Abdomen: Soft, gravid, nontender ? Pelvic: Cervical exam deferred        ? Extremities: Edema: None ? Psych:  mood and affect appropriate ? ?Fetal Status:     Movement: Present   ?[place] 45+8 wks,cephalic,cx 2.9 cm,LVEICF 4.4 mm,FHR 150 bpm,normal  ovaries,posterior/fundal placenta gr 0,EFW 372 g 52%,anatomy complete ? ?Chaperone: n/a   ? ? ?Assessment & Plan:  ?1) Low-risk pregnancy G2P1001 at [redacted]w[redacted]d with an Estimated Date of Delivery: 06/08/22  ? ?2) TOLAC- discussed risk/benefit and formed reviewed.  Pt wanted to review form on her onw. $Remove'[]'hwMnLgy$  sign consent next visit ? ?-reviewed anatomy scan, isolated EIF ?  ?Meds:  ?Meds ordered this encounter  ?Medications  ? Blood Pressure Monitoring (BLOOD PRESSURE CUFF) MISC  ?  Sig: 1 kit by Does not apply route once a week.  ?  Dispense:  1 each  ?  Refill:  0  ? ?Labs/procedures today: anatomy scan ? ?Plan:  Continue routine obstetrical care  ?Next visit: prefers in person   ? ?Reviewed: Preterm labor symptoms and general obstetric precautions including but not limited to vaginal bleeding, contractions, leaking of fluid and fetal movement were reviewed in detail with the patient.  All questions were answered. BP cuff ordered  ? ?Follow-up: Return in about 4 weeks (around 02/20/2022) for [place] visit. ? ?No orders of the defined types were placed in this encounter. ? ? ?Janyth Pupa, DO ?Attending [place], Faculty Practice ?[facility] [place] ? ? ? ?

## 2022-02-20 ENCOUNTER — Ambulatory Visit (INDEPENDENT_AMBULATORY_CARE_PROVIDER_SITE_OTHER): Payer: Medicaid Other | Admitting: Advanced Practice Midwife

## 2022-02-20 ENCOUNTER — Encounter: Payer: Self-pay | Admitting: Advanced Practice Midwife

## 2022-02-20 VITALS — BP 135/88 | HR 80 | Wt 228.0 lb

## 2022-02-20 DIAGNOSIS — O99012 Anemia complicating pregnancy, second trimester: Secondary | ICD-10-CM | POA: Diagnosis not present

## 2022-02-20 DIAGNOSIS — Z3480 Encounter for supervision of other normal pregnancy, unspecified trimester: Secondary | ICD-10-CM

## 2022-02-20 DIAGNOSIS — O23592 Infection of other part of genital tract in pregnancy, second trimester: Secondary | ICD-10-CM

## 2022-02-20 DIAGNOSIS — O98812 Other maternal infectious and parasitic diseases complicating pregnancy, second trimester: Secondary | ICD-10-CM

## 2022-02-20 DIAGNOSIS — A5901 Trichomonal vulvovaginitis: Secondary | ICD-10-CM

## 2022-02-20 DIAGNOSIS — Z3A24 24 weeks gestation of pregnancy: Secondary | ICD-10-CM

## 2022-02-20 DIAGNOSIS — A749 Chlamydial infection, unspecified: Secondary | ICD-10-CM

## 2022-02-20 LAB — POCT HEMOGLOBIN: Hemoglobin: 9.3 g/dL — AB (ref 11–14.6)

## 2022-02-20 NOTE — Progress Notes (Signed)
   LOW-RISK PREGNANCY VISIT Patient name: Mallory Willis MRN [id number]  Date of birth: March 06, 1996 Chief Complaint:   low risk gestation and Routine Prenatal Visit (May 2023 hgb 9.0)  History of Present Illness:   Mallory Willis is a 26 y.o. G15P1001 female at [redacted]w[redacted]d with an Estimated Date of Delivery: 06/08/22 being seen today for ongoing management of a low-risk pregnancy.  Today she reports  feeling sick this morning (vomited), but has generally been feeling better; taking iron qod . Contractions: Not present.  .  Movement: Present. denies leaking of fluid. Review of Systems:   Pertinent items are noted in HPI Denies abnormal vaginal discharge w/ itching/odor/irritation, headaches, visual changes, shortness of breath, chest pain, abdominal pain, severe nausea/vomiting, or problems with urination or bowel movements unless otherwise stated above. Pertinent History Reviewed:  Reviewed past medical,surgical, social, obstetrical and family history.  Reviewed problem list, medications and allergies. Physical Assessment:   Vitals:   02/20/22 1035  BP: 135/88  Pulse: 80  Weight: 228 lb (103.4 kg)  Body mass index is 33.67 kg/m.        Physical Examination:   General appearance: Well appearing, and in no distress  Mental status: Alert, oriented to person, place, and time  Skin: Warm & dry  Cardiovascular: Normal heart rate noted  Respiratory: Normal respiratory effort, no distress  Abdomen: Soft, gravid, nontender  Pelvic: Cervical exam deferred         Extremities: Edema: Trace  Fetal Status: Fetal Heart Rate (bpm): 139 Fundal Height: 25 cm Movement: Present    Results for orders placed or performed in visit on 02/20/22 (from the past 24 hour(s))  POCT hemoglobin   Collection Time: 02/20/22 11:01 AM  Result Value Ref Range   Hemoglobin 9.3 (A) 11 - 14.6 g/dL    Assessment & Plan:  1) Low-risk pregnancy G2P1001 at [redacted]w[redacted]d with an Estimated Date of Delivery: 06/08/22   2) Prev C/S, wants  TOLAC, will get consent ~36wks  3) Anemia, Hgb 9.3 today; taking qod Fe; may schedule for 3rd trimester Venofer depending on PN2 Hgb result  4) Hx gHTN, taking bASA, neg baseline pre-e labs   Meds: No orders of the defined types were placed in this encounter.  Labs/procedures today: none Marchelle Folks will call re AFP info)  Plan:  Continue routine obstetrical care   Reviewed: Preterm labor symptoms and general obstetric precautions including but not limited to vaginal bleeding, contractions, leaking of fluid and fetal movement were reviewed in detail with the patient.  All questions were answered. Didn't ask about home bp cuff.  Check bp weekly, let us know if >140/90.   Follow-up: Return in about 3 weeks (around 03/13/2022) for LROB, PN2.  Orders Placed This Encounter  Procedures   POCT hemoglobin   Arabella Merles [facility] 02/20/2022 11:14 AM

## 2022-02-20 NOTE — Patient Instructions (Signed)
Nilda Riggs, I greatly value your feedback.  If you receive a survey following your visit with [place] today, we appreciate you taking the time to fill it out.  Thanks, Philipp Deputy, CNM   You will have your sugar test next visit.  Please do not eat or drink anything after midnight the night before you come, not even water.  You will be here for at least two hours.  Please make an appointment online for the bloodwork at [email or web address] for 8:30am (or as close to this as possible). Make sure you select the [facility] service center. The day of the appointment, check in with our office first, then you will go to [facility] to start the sugar test.    [facility] HAS MOVED!!! It is now [facility] at [facility] ([address] [place], [place] [address]) Entrance C, located off of [address] valet parking  Go to [email or web address] to register for FREE online childbirth classes   Call the office [phone]) or go to [facility] if: You begin to have strong, frequent contractions Your water breaks.  Sometimes it is a big gush of fluid, sometimes it is just a trickle that keeps getting your panties wet or running down your legs You have vaginal bleeding.  It is normal to have a small amount of spotting if your cervix was checked.  You don't feel your baby moving like normal.  If you don't, get you something to eat and drink and lay down and focus on feeling your baby move.   If your baby is still not moving like normal, you should call the office or go to [facility].  [place] Pediatricians/Family Doctors: [facility] [phone]           [facility] [phone]                [facility] [phone] (usually not accepting new patients unless you have family there already, you are always welcome to call and ask)      [facility] Department [phone]       [facility] Pediatricians/Family Doctors:  [facility]: [phone] [facility]: [phone] [facility]: [phone]  [facility] Doctors:  [facility]: [phone]  [facility]: [phone]  [facility] Doctors: [facility]: [phone]   Home Blood Pressure Monitoring for Patients   Your provider has recommended that you check your blood pressure (BP) at least once a week at home. If you do not have a blood pressure cuff at home, one will be provided for you. Contact your provider if you have not received your monitor within 1 week.   Helpful Tips for Accurate Home Blood Pressure Checks  Don't smoke, exercise, or drink caffeine 30 minutes before checking your BP Use the restroom before checking your BP (a full bladder can raise your pressure) Relax in a comfortable upright chair Feet on the ground Left arm resting comfortably on a flat surface at the level of your heart Legs uncrossed Back supported Sit quietly and don't talk Place the cuff on your bare arm Adjust snuggly, so that only two fingertips can fit between your skin and the top of the cuff Check 2 readings separated by at least one minute Keep a log of your BP readings For a visual, please reference this diagram: http://ccnc.care/bpdiagram  Provider Name: [facility]     Phone: [phone]  Zone 1: ALL CLEAR  Continue to monitor your symptoms:  BP reading is less than 140 (top number) or less than 90 (bottom number)  No right upper stomach pain No headaches or seeing spots No feeling nauseated or throwing up No swelling in face and hands  Zone 2: CAUTION Call your doctor's office for any of the following:  BP reading is greater than 140 (top number) or greater than 90 (bottom number)  Stomach pain under your ribs in the middle or right side Headaches or seeing spots Feeling nauseated or throwing up Swelling in face and hands  Zone 3: EMERGENCY  Seek  immediate medical care if you have any of the following:  BP reading is greater than160 (top number) or greater than 110 (bottom number) Severe headaches not improving with Tylenol Serious difficulty catching your breath Any worsening symptoms from Zone 2   Second Trimester of Pregnancy The second trimester is from week 13 through week 28, months 4 through 6. The second trimester is often a time when you feel your best. Your body has also adjusted to being pregnant, and you begin to feel better physically. Usually, morning sickness has lessened or quit completely, you may have more energy, and you may have an increase in appetite. The second trimester is also a time when the fetus is growing rapidly. At the end of the sixth month, the fetus is about 9 inches long and weighs about 1 pounds. You will likely begin to feel the baby move (quickening) between 18 and 20 weeks of the pregnancy. BODY CHANGES Your body goes through many changes during pregnancy. The changes vary from woman to woman.  Your weight will continue to increase. You will notice your lower abdomen bulging out. You may begin to get stretch marks on your hips, abdomen, and breasts. You may develop headaches that can be relieved by medicines approved by your health care provider. You may urinate more often because the fetus is pressing on your bladder. You may develop or continue to have heartburn as a result of your pregnancy. You may develop constipation because certain hormones are causing the muscles that push waste through your intestines to slow down. You may develop hemorrhoids or swollen, bulging veins (varicose veins). You may have back pain because of the weight gain and pregnancy hormones relaxing your joints between the bones in your pelvis and as a result of a shift in weight and the muscles that support your balance. Your breasts will continue to grow and be tender. Your gums may bleed and may be sensitive to brushing  and flossing. Dark spots or blotches (chloasma, mask of pregnancy) may develop on your face. This will likely fade after the baby is born. A dark line from your belly button to the pubic area (linea nigra) may appear. This will likely fade after the baby is born. You may have changes in your hair. These can include thickening of your hair, rapid growth, and changes in texture. Some women also have hair loss during or after pregnancy, or hair that feels dry or thin. Your hair will most likely return to normal after your baby is born. WHAT TO EXPECT AT YOUR PRENATAL VISITS During a routine prenatal visit: You will be weighed to make sure you and the fetus are growing normally. Your blood pressure will be taken. Your abdomen will be measured to track your baby's growth. The fetal heartbeat will be listened to. Any test results from the previous visit will be discussed. Your health care provider  may ask you: How you are feeling. If you are feeling the baby move. If you have had any abnormal symptoms, such as leaking fluid, bleeding, severe headaches, or abdominal cramping. If you have any questions. Other tests that may be performed during your second trimester include: Blood tests that check for: Low iron levels (anemia). Gestational diabetes (between 24 and 28 weeks). Rh antibodies. Urine tests to check for infections, diabetes, or protein in the urine. An ultrasound to confirm the proper growth and development of the baby. An amniocentesis to check for possible genetic problems. Fetal screens for spina bifida and Down syndrome. HOME CARE INSTRUCTIONS  Avoid all smoking, herbs, alcohol, and unprescribed drugs. These chemicals affect the formation and growth of the baby. Follow your health care provider's instructions regarding medicine use. There are medicines that are either safe or unsafe to take during pregnancy. Exercise only as directed by your health care provider. Experiencing  uterine cramps is a good sign to stop exercising. Continue to eat regular, healthy meals. Wear a good support bra for breast tenderness. Do not use hot tubs, steam rooms, or saunas. Wear your seat belt at all times when driving. Avoid raw meat, uncooked cheese, cat litter boxes, and soil used by cats. These carry germs that can cause birth defects in the baby. Take your prenatal vitamins. Try taking a stool softener (if your health care provider approves) if you develop constipation. Eat more high-fiber foods, such as fresh vegetables or fruit and whole grains. Drink plenty of fluids to keep your urine clear or pale yellow. Take warm sitz baths to soothe any pain or discomfort caused by hemorrhoids. Use hemorrhoid cream if your health care provider approves. If you develop varicose veins, wear support hose. Elevate your feet for 15 minutes, 3-4 times a day. Limit salt in your diet. Avoid heavy lifting, wear low heel shoes, and practice good posture. Rest with your legs elevated if you have leg cramps or low back pain. Visit your dentist if you have not gone yet during your pregnancy. Use a soft toothbrush to brush your teeth and be gentle when you floss. A sexual relationship may be continued unless your health care provider directs you otherwise. Continue to go to all your prenatal visits as directed by your health care provider. SEEK MEDICAL CARE IF:  You have dizziness. You have mild pelvic cramps, pelvic pressure, or nagging pain in the abdominal area. You have persistent nausea, vomiting, or diarrhea. You have a bad smelling vaginal discharge. You have pain with urination. SEEK IMMEDIATE MEDICAL CARE IF:  You have a fever. You are leaking fluid from your vagina. You have spotting or bleeding from your vagina. You have severe abdominal cramping or pain. You have rapid weight gain or loss. You have shortness of breath with chest pain. You notice sudden or extreme swelling of your face,  hands, ankles, feet, or legs. You have not felt your baby move in over an hour. You have severe headaches that do not go away with medicine. You have vision changes. Document Released: 08/25/2001 Document Revised: 09/05/2013 Document Reviewed: 11/01/2012 [facility] Patient Information 2015 [place], [place]. This information is not intended to replace advice given to you by your health care provider. Make sure you discuss any questions you have with your health care provider.

## 2022-02-24 LAB — AFP, SERUM, OPEN SPINA BIFIDA
AFP MoM: 1.17
AFP Value: 45.1 ng/mL
Gest. Age on Collection Date: 18 weeks
Maternal Age At EDD: 24.9 yr
OSBR Risk 1 IN: 10000
Test Results:: NEGATIVE
Weight: 225 [lb_av]

## 2022-03-09 ENCOUNTER — Telehealth: Payer: Self-pay | Admitting: *Deleted

## 2022-03-09 NOTE — Telephone Encounter (Signed)
Patient states for the past week, she has been having pelvic pain/pressure.  Notices it more when she is on her feet. Also is having occasional cramps that are sharp that come and go that started this morning. She does use a pillow under her belly when she sleeps sometimes but not every night. Denies bleeding, discharge, pain or burning with urination. Only drinks about 2-3 bottles of water daily. Encouraged to push fluids today and try getting a maternity band or tight fitting pants to give her more support.  Advised if pain worsened or she noticed any bleeding, leaking or increase in cramping, to let us know or go to [facility]. Pt verbalized understanding with no further questions.

## 2022-03-16 ENCOUNTER — Encounter: Payer: Medicaid Other | Admitting: Women's Health

## 2022-03-16 ENCOUNTER — Other Ambulatory Visit: Payer: Medicaid Other

## 2022-03-16 DIAGNOSIS — Z3A27 27 weeks gestation of pregnancy: Secondary | ICD-10-CM

## 2022-03-16 DIAGNOSIS — Z3480 Encounter for supervision of other normal pregnancy, unspecified trimester: Secondary | ICD-10-CM

## 2022-03-16 DIAGNOSIS — Z131 Encounter for screening for diabetes mellitus: Secondary | ICD-10-CM

## 2022-03-24 ENCOUNTER — Other Ambulatory Visit: Payer: Medicaid Other

## 2022-03-24 ENCOUNTER — Ambulatory Visit (INDEPENDENT_AMBULATORY_CARE_PROVIDER_SITE_OTHER): Payer: Medicaid Other | Admitting: Women's Health

## 2022-03-24 ENCOUNTER — Encounter: Payer: Self-pay | Admitting: Women's Health

## 2022-03-24 VITALS — BP 118/71 | HR 85 | Wt 230.0 lb

## 2022-03-24 DIAGNOSIS — Z3480 Encounter for supervision of other normal pregnancy, unspecified trimester: Secondary | ICD-10-CM

## 2022-03-24 DIAGNOSIS — Z131 Encounter for screening for diabetes mellitus: Secondary | ICD-10-CM | POA: Diagnosis not present

## 2022-03-24 DIAGNOSIS — Z3483 Encounter for supervision of other normal pregnancy, third trimester: Secondary | ICD-10-CM

## 2022-03-24 DIAGNOSIS — Z3A27 27 weeks gestation of pregnancy: Secondary | ICD-10-CM | POA: Diagnosis not present

## 2022-03-24 MED ORDER — BLOOD PRESSURE MONITOR MISC: 0 refills | Status: DC

## 2022-03-24 NOTE — Patient Instructions (Addendum)
Torah, thank you for choosing our office today! We appreciate the opportunity to meet your healthcare needs. You may receive a short survey by mail, e-mail, or through [employer]. If you are happy with your care we would appreciate if you could take just a few minutes to complete the survey questions. We read all of your comments and take your feedback very seriously. Thank you again for choosing our office.  Center for [employer] Team at [facility]  [facility] at [facility] ([address] [place], [place] [address]) Entrance C, located off of [address] valet parking   For your lower back/hip pain you may: Purchase a pregnancy/maternity support belt from [employer], [employer], [employer], [employer], etc and wear it while you are up and about Take warm baths Use a heating pad to your lower back for no longer than 20 minutes at a time, and do not place near abdomen Take tylenol as needed. Please follow directions on the bottle Kinesiology tape (can get from sporting goods store), google how to tape belly for pregnancy   CLASSES: Go to [email or web address] to register for classes (childbirth, breastfeeding, waterbirth, infant CPR, daddy bootcamp, etc.)  Call the office [phone]) or go to [facility] if: You begin to have strong, frequent contractions Your water breaks.  Sometimes it is a big gush of fluid, sometimes it is just a trickle that keeps getting your panties wet or running down your legs You have vaginal bleeding.  It is normal to have a small amount of spotting if your cervix was checked.  You don't feel your baby moving like normal.  If you don't, get you something to eat and drink and lay down and focus on feeling your baby move.   If your baby is still not moving like normal, you should call the office or go to [facility].  Call the office [phone]) or go to [facility] hospital for these signs of pre-eclampsia: Severe headache that does  not go away with Tylenol Visual changes- seeing spots, double, blurred vision Pain under your right breast or upper abdomen that does not go away with Tums or heartburn medicine Nausea and/or vomiting Severe swelling in your hands, feet, and face   Tdap Vaccine It is recommended that you get the Tdap vaccine during the third trimester of EACH pregnancy to help protect your baby from getting pertussis (whooping cough) 27-36 weeks is the BEST time to do this so that you can pass the protection on to your baby. During pregnancy is better than after pregnancy, but if you are unable to get it during pregnancy it will be offered at the hospital.  You can get this vaccine with [place], at the health department, your family doctor, or some local pharmacies Everyone who will be around your baby should also be up-to-date on their vaccines before the baby comes. Adults (who are not pregnant) only need 1 dose of Tdap during adulthood.   [facility] Pediatricians/Family Doctors [facility]): [address] Colette Ribas, [phone]           [facility]: [address], [phone]                [facility] Medicine [facility]): [address], [phone] (call to ask if accepting patients) [facility] Department: [address], [place], [phone]    [facility] Pediatricians/Family Doctors [facility]): [phone], [phone]  [facility]: [address] [place], [phone] [facility]: [address], [phone]  [facility] Doctors  [facility]): [phone] [facility]: [address], [phone]   [facility] Doctors [facility]: [address] [phone]  [facility] Doctors  [facility]: [phone], [phone]  Home Blood Pressure Monitoring for Patients    Your provider has recommended that you check your blood pressure (BP) at least once a week at home. If you do not have a blood pressure cuff at home, one will be provided for you. Contact your provider if you have not received your monitor within 1 week.   Helpful Tips for Accurate Home Blood Pressure Checks  Don't smoke, exercise, or drink caffeine 30 minutes before checking your BP Use the restroom before checking your BP (a full bladder can raise your pressure) Relax in a comfortable upright chair Feet on the ground Left arm resting comfortably on a flat surface at the level of your heart Legs uncrossed Back supported Sit quietly and don't talk Place the cuff on your bare arm Adjust snuggly, so that only two fingertips can fit between your skin and the top of the cuff Check 2 readings separated by at least one minute Keep a log of your BP readings For a visual, please reference this diagram: http://ccnc.care/bpdiagram  Provider Name: [facility]     Phone: [phone]  Zone 1: ALL CLEAR  Continue to monitor your symptoms:  BP reading is less than 140 (top number) or less than 90 (bottom number)  No right upper stomach pain No headaches or seeing spots No feeling nauseated or throwing up No swelling in face and hands  Zone 2: CAUTION Call your doctor's office for any of the following:  BP reading is greater than 140 (top number) or greater than 90 (bottom number)  Stomach pain under your ribs in the middle or right side Headaches or seeing spots Feeling nauseated or throwing up Swelling in face and hands  Zone 3: EMERGENCY  Seek immediate medical care if you have any of the following:  BP reading is greater than160 (top number) or greater than 110 (bottom number) Severe headaches not improving with Tylenol Serious difficulty catching your breath Any worsening symptoms from Zone 2   Third Trimester of Pregnancy The third trimester is from week 29 through week  42, months 7 through 9. The third trimester is a time when the fetus is growing rapidly. At the end of the ninth month, the fetus is about 20 inches in length and weighs 6-10 pounds.  BODY CHANGES Your body goes through many changes during pregnancy. The changes vary from woman to woman.  Your weight will continue to increase. You can expect to gain 25-35 pounds (11-16 kg) by the end of the pregnancy. You may begin to get stretch marks on your hips, abdomen, and breasts. You may urinate more often because the fetus is moving lower into your pelvis and pressing on your bladder. You may develop or continue to have heartburn as a result of your pregnancy. You may develop constipation because certain hormones are causing the muscles that push waste through your intestines to slow down. You may develop hemorrhoids or swollen, bulging veins (varicose veins). You may have pelvic pain because of the weight gain and pregnancy hormones relaxing your joints between the bones in your pelvis. Backaches may result from overexertion of the  muscles supporting your posture. You may have changes in your hair. These can include thickening of your hair, rapid growth, and changes in texture. Some women also have hair loss during or after pregnancy, or hair that feels dry or thin. Your hair will most likely return to normal after your baby is born. Your breasts will continue to grow and be tender. A yellow discharge may leak from your breasts called colostrum. Your belly button may stick out. You may feel short of breath because of your expanding uterus. You may notice the fetus "dropping," or moving lower in your abdomen. You may have a bloody mucus discharge. This usually occurs a few days to a week before labor begins. Your cervix becomes thin and soft (effaced) near your due date. WHAT TO EXPECT AT YOUR PRENATAL EXAMS  You will have prenatal exams every 2 weeks until week 36. Then, you will have weekly prenatal  exams. During a routine prenatal visit: You will be weighed to make sure you and the fetus are growing normally. Your blood pressure is taken. Your abdomen will be measured to track your baby's growth. The fetal heartbeat will be listened to. Any test results from the previous visit will be discussed. You may have a cervical check near your due date to see if you have effaced. At around 36 weeks, your caregiver will check your cervix. At the same time, your caregiver will also perform a test on the secretions of the vaginal tissue. This test is to determine if a type of bacteria, Group B streptococcus, is present. Your caregiver will explain this further. Your caregiver may ask you: What your birth plan is. How you are feeling. If you are feeling the baby move. If you have had any abnormal symptoms, such as leaking fluid, bleeding, severe headaches, or abdominal cramping. If you have any questions. Other tests or screenings that may be performed during your third trimester include: Blood tests that check for low iron levels (anemia). Fetal testing to check the health, activity level, and growth of the fetus. Testing is done if you have certain medical conditions or if there are problems during the pregnancy. FALSE LABOR You may feel small, irregular contractions that eventually go away. These are called Braxton Hicks contractions, or false labor. Contractions may last for hours, days, or even weeks before true labor sets in. If contractions come at regular intervals, intensify, or become painful, it is best to be seen by your caregiver.  SIGNS OF LABOR  Menstrual-like cramps. Contractions that are 5 minutes apart or less. Contractions that start on the top of the uterus and spread down to the lower abdomen and back. A sense of increased pelvic pressure or back pain. A watery or bloody mucus discharge that comes from the vagina. If you have any of these signs before the 37th week of  pregnancy, call your caregiver right away. You need to go to the hospital to get checked immediately. HOME CARE INSTRUCTIONS  Avoid all smoking, herbs, alcohol, and unprescribed drugs. These chemicals affect the formation and growth of the baby. Follow your caregiver's instructions regarding medicine use. There are medicines that are either safe or unsafe to take during pregnancy. Exercise only as directed by your caregiver. Experiencing uterine cramps is a good sign to stop exercising. Continue to eat regular, healthy meals. Wear a good support bra for breast tenderness. Do not use hot tubs, steam rooms, or saunas. Wear your seat belt at all times when driving. Avoid raw  meat, uncooked cheese, cat litter boxes, and soil used by cats. These carry germs that can cause birth defects in the baby. Take your prenatal vitamins. Try taking a stool softener (if your caregiver approves) if you develop constipation. Eat more high-fiber foods, such as fresh vegetables or fruit and whole grains. Drink plenty of fluids to keep your urine clear or pale yellow. Take warm sitz baths to soothe any pain or discomfort caused by hemorrhoids. Use hemorrhoid cream if your caregiver approves. If you develop varicose veins, wear support hose. Elevate your feet for 15 minutes, 3-4 times a day. Limit salt in your diet. Avoid heavy lifting, wear low heal shoes, and practice good posture. Rest a lot with your legs elevated if you have leg cramps or low back pain. Visit your dentist if you have not gone during your pregnancy. Use a soft toothbrush to brush your teeth and be gentle when you floss. A sexual relationship may be continued unless your caregiver directs you otherwise. Do not travel far distances unless it is absolutely necessary and only with the approval of your caregiver. Take prenatal classes to understand, practice, and ask questions about the labor and delivery. Make a trial run to the hospital. Pack your  hospital bag. Prepare the baby's nursery. Continue to go to all your prenatal visits as directed by your caregiver. SEEK MEDICAL CARE IF: You are unsure if you are in labor or if your water has broken. You have dizziness. You have mild pelvic cramps, pelvic pressure, or nagging pain in your abdominal area. You have persistent nausea, vomiting, or diarrhea. You have a bad smelling vaginal discharge. You have pain with urination. SEEK IMMEDIATE MEDICAL CARE IF:  You have a fever. You are leaking fluid from your vagina. You have spotting or bleeding from your vagina. You have severe abdominal cramping or pain. You have rapid weight loss or gain. You have shortness of breath with chest pain. You notice sudden or extreme swelling of your face, hands, ankles, feet, or legs. You have not felt your baby move in over an hour. You have severe headaches that do not go away with medicine. You have vision changes. Document Released: 08/25/2001 Document Revised: 09/05/2013 Document Reviewed: 11/01/2012 [facility] [place] [facility] Patient Information 2015 [place], [place]. This information is not intended to replace advice given to you by your health care provider. Make sure you discuss any questions you have with your health care provider.

## 2022-03-24 NOTE — Progress Notes (Signed)
LOW-RISK PREGNANCY VISIT Patient name: Mallory Willis MRN [id number]  Date of birth: 1996/05/03 Chief Complaint:   Routine Prenatal Visit (PN2/Pain in hips when walking)  History of Present Illness:   Mallory Willis is a 26 y.o. G55P1001 female at [redacted]w[redacted]d with an Estimated Date of Delivery: 06/08/22 being seen today for ongoing management of a low-risk pregnancy.   Today she reports  pain in hips when walking . Contractions: Not present. Vag. Bleeding: None.  Movement: Present. denies leaking of fluid.     03/24/2022    9:36 AM 12/05/2021    9:13 AM 08/14/2016    1:27 PM  Depression screen PHQ 2/9  Decreased Interest 1 0 0  Down, Depressed, Hopeless 0 0 0  PHQ - 2 Score 1 0 0  Altered sleeping 0 0 0  Tired, decreased energy 1 1 0  Change in appetite 1 1 2   Feeling bad or failure about yourself  0 0 0  Trouble concentrating 0 0 0  Moving slowly or fidgety/restless 0 0 0  Suicidal thoughts 0 0 0  PHQ-9 Score 3 2 2         03/24/2022    9:36 AM 12/05/2021    9:14 AM  GAD 7 : Generalized Anxiety Score  Nervous, Anxious, on Edge 0 0  Control/stop worrying 0 0  Worry too much - different things 0 0  Trouble relaxing 1 0  Restless 0 0  Easily annoyed or irritable 1 1  Afraid - awful might happen 0 0  Total GAD 7 Score 2 1      Review of Systems:   Pertinent items are noted in HPI Denies abnormal vaginal discharge w/ itching/odor/irritation, headaches, visual changes, shortness of breath, chest pain, abdominal pain, severe nausea/vomiting, or problems with urination or bowel movements unless otherwise stated above. Pertinent History Reviewed:  Reviewed past medical,surgical, social, obstetrical and family history.  Reviewed problem list, medications and allergies. Physical Assessment:   Vitals:   03/24/22 0925  BP: 118/71  Pulse: 85  Weight: 230 lb (104.3 kg)  Body mass index is 33.97 kg/m.        Physical Examination:   General appearance: Well appearing, and in no  distress  Mental status: Alert, oriented to person, place, and time  Skin: Warm & dry  Cardiovascular: Normal heart rate noted  Respiratory: Normal respiratory effort, no distress  Abdomen: Soft, gravid, nontender  Pelvic: Cervical exam deferred         Extremities: Edema: None  Fetal Status: Fetal Heart Rate (bpm): 144 Fundal Height: 28 cm Movement: Present    Chaperone: N/A   No results found for this or any previous visit (from the past 24 hour(s)).  Assessment & Plan:  1) Low-risk pregnancy G2P1001 at [redacted]w[redacted]d with an Estimated Date of Delivery: 06/08/22   2) Prev c/s, wants TOLAC  3) H/O GHTN> ASA  4) Anemia> taking Fe, checking hemoglobin w/ labs today  5) Hip pain when walking> discussed belt/tape, walking/changing positions slowly   Meds:  Meds ordered this encounter  Medications   Blood Pressure Monitor MISC    Sig: For regular home bp monitoring during pregnancy    Dispense:  1 each    Refill:  0    Z34.81 Please mail to patient   Labs/procedures today: PN2 and declines tdap today, plans next visit  Plan:  Continue routine obstetrical care  Next visit: prefers in person    Reviewed: Preterm labor symptoms and general obstetric  precautions including but not limited to vaginal bleeding, contractions, leaking of fluid and fetal movement were reviewed in detail with the patient.  All questions were answered. Does not have home bp cuff. Office bp cuff given: not applicable, rx sent today. Check bp weekly, let us know if consistently >140 and/or >90.  Follow-up: Return in about 2 weeks (around 04/07/2022) for LROB, in person, MD only to sign VBAC consent.  Future Appointments  Date Time Provider Department Center  04/07/2022 10:10 AM Myna Hidalgo, DO [facility]    No orders of the defined types were placed in this encounter.  Cheral Marker CNM, [facility] 03/24/2022 9:49 AM

## 2022-03-25 LAB — CBC
Hematocrit: 30.5 % — ABNORMAL LOW (ref 34.0–46.6)
Hemoglobin: 9.8 g/dL — ABNORMAL LOW (ref 11.1–15.9)
MCH: 25.8 pg — ABNORMAL LOW (ref 26.6–33.0)
MCHC: 32.1 g/dL (ref 31.5–35.7)
MCV: 80 fL (ref 79–97)
Platelets: 366 10*3/uL (ref 150–450)
RBC: 3.8 x10E6/uL (ref 3.77–5.28)
RDW: 18.9 % — ABNORMAL HIGH (ref 11.7–15.4)
WBC: 7.2 10*3/uL (ref 3.4–10.8)

## 2022-03-25 LAB — GLUCOSE TOLERANCE, 2 HOURS W/ 1HR
Glucose, 1 hour: 88 mg/dL (ref 70–179)
Glucose, 2 hour: 94 mg/dL (ref 70–152)
Glucose, Fasting: 82 mg/dL (ref 70–91)

## 2022-03-25 LAB — RPR: RPR Ser Ql: NONREACTIVE

## 2022-03-25 LAB — HIV ANTIBODY (ROUTINE TESTING W REFLEX): HIV Screen 4th Generation wRfx: NONREACTIVE

## 2022-03-25 LAB — ANTIBODY SCREEN: Antibody Screen: NEGATIVE

## 2022-04-07 ENCOUNTER — Encounter: Payer: Self-pay | Admitting: Obstetrics & Gynecology

## 2022-04-07 ENCOUNTER — Ambulatory Visit (INDEPENDENT_AMBULATORY_CARE_PROVIDER_SITE_OTHER): Payer: Medicaid Other | Admitting: Obstetrics & Gynecology

## 2022-04-07 VITALS — BP 116/67 | HR 93 | Wt 232.0 lb

## 2022-04-07 DIAGNOSIS — Z3A31 31 weeks gestation of pregnancy: Secondary | ICD-10-CM | POA: Diagnosis not present

## 2022-04-07 DIAGNOSIS — Z3480 Encounter for supervision of other normal pregnancy, unspecified trimester: Secondary | ICD-10-CM

## 2022-04-07 DIAGNOSIS — Z8619 Personal history of other infectious and parasitic diseases: Secondary | ICD-10-CM

## 2022-04-07 DIAGNOSIS — Z23 Encounter for immunization: Secondary | ICD-10-CM | POA: Diagnosis not present

## 2022-04-07 NOTE — Progress Notes (Signed)
LOW-RISK PREGNANCY VISIT Patient name: Mallory Willis MRN [id number]  Date of birth: 02-03-1996 Chief Complaint:   Routine Prenatal Visit  History of Present Illness:   Mallory Willis is a 26 y.o. G29P1001 female at [redacted]w[redacted]d with an Estimated Date of Delivery: 06/08/22 being seen today for ongoing management of a low-risk pregnancy.   -TOLAC -tobacco use -h/o Chlamydia, POC negative. Pt recently sexually active with same partner and desires STI screen     03/24/2022    9:36 AM 12/05/2021    9:13 AM 08/14/2016    1:27 PM  Depression screen PHQ 2/9  Decreased Interest 1 0 0  Down, Depressed, Hopeless 0 0 0  PHQ - 2 Score 1 0 0  Altered sleeping 0 0 0  Tired, decreased energy 1 1 0  Change in appetite 1 1 2   Feeling bad or failure about yourself  0 0 0  Trouble concentrating 0 0 0  Moving slowly or fidgety/restless 0 0 0  Suicidal thoughts 0 0 0  PHQ-9 Score 3 2 2     Today she reports no complaints. Contractions: Not present. Vag. Bleeding: None.  Movement: Present. denies leaking of fluid. Review of Systems:   Pertinent items are noted in HPI Denies abnormal vaginal discharge w/ itching/odor/irritation, headaches, visual changes, shortness of breath, chest pain, abdominal pain, severe nausea/vomiting, or problems with urination or bowel movements unless otherwise stated above. Pertinent History Reviewed:  Reviewed past medical,surgical, social, obstetrical and family history.  Reviewed problem list, medications and allergies.  Physical Assessment:   Vitals:   04/07/22 1027  BP: 116/67  Pulse: 93  Weight: 232 lb (105.2 kg)  Body mass index is 34.26 kg/m.        Physical Examination:   General appearance: Well appearing, and in no distress  Mental status: Alert, oriented to person, place, and time  Skin: Warm & dry  Respiratory: Normal respiratory effort, no distress  Abdomen: Soft, gravid, nontender  Pelvic: Cervical exam deferred         Extremities: Edema: None  Psych:   mood and affect appropriate  Fetal Status: Fetal Heart Rate (bpm): 145 Fundal Height: 30 cm Movement: Present    Chaperone: n/a    No results found for this or any previous visit (from the past 24 hour(s)).   Assessment & Plan:  1) Low-risk pregnancy G2P1001 at [redacted]w[redacted]d with an Estimated Date of Delivery: 06/08/22   2) TOLAC, consent form reviewed- discussed risk/benefit including but not limited to risk of bleeding, uterine rupture which could lead to both fetal or maternal complications.  Upon further questioning, pt also desires permanent sterilization.  Discussed bilateral tubal ligation via salpingectomy- reviewed risk/benefits including but not limited to risk of bleeding, infection and injury.  Questions and concerns were addressed.  Consent signed for both tubal ligation and TOLAC.   HOWEVER, pt notes that if she decides to proceed with tubal ligation she will likely have a repeat C-section.  For now, pt wishes to think about her options and will hopefully let [redacted]w[redacted]d know her final decision next visit.   Meds: No orders of the defined types were placed in this encounter.  Labs/procedures today: Tdap, GC/C  Plan:  Continue routine obstetrical care  Next visit: prefers in person    Reviewed: Preterm labor symptoms and general obstetric precautions including but not limited to vaginal bleeding, contractions, leaking of fluid and fetal movement were reviewed in detail with the patient.  All questions were answered. Pt has  home bp cuff. Check bp weekly, let us know if >140/90.   Follow-up: Return in about 2 weeks (around 04/21/2022) for LROB visit.  Orders Placed This Encounter  Procedures   GC/Chlamydia Probe Amp   Tdap vaccine greater than or equal to 7yo IM    Myna Hidalgo, DO Attending Obstetrician & Gynecologist, [facility] for [employer], [facility]

## 2022-04-13 DIAGNOSIS — M654 Radial styloid tenosynovitis [de Quervain]: Secondary | ICD-10-CM | POA: Diagnosis not present

## 2022-04-13 DIAGNOSIS — M25531 Pain in right wrist: Secondary | ICD-10-CM | POA: Diagnosis not present

## 2022-04-13 DIAGNOSIS — S6991XA Unspecified injury of right wrist, hand and finger(s), initial encounter: Secondary | ICD-10-CM | POA: Diagnosis not present

## 2022-04-13 DIAGNOSIS — M79641 Pain in right hand: Secondary | ICD-10-CM | POA: Diagnosis not present

## 2022-04-13 DIAGNOSIS — M79644 Pain in right finger(s): Secondary | ICD-10-CM | POA: Diagnosis not present

## 2022-04-17 ENCOUNTER — Emergency Department (HOSPITAL_COMMUNITY): Admission: EM | Admit: 2022-04-17 | Payer: Medicaid Other

## 2022-04-17 ENCOUNTER — Inpatient Hospital Stay (HOSPITAL_COMMUNITY)
Admission: AD | Admit: 2022-04-17 | Discharge: 2022-04-17 | Disposition: A | Discharge status: Home / Self Care | Payer: Medicaid Other | Attending: Obstetrics and Gynecology | Admitting: Obstetrics and Gynecology

## 2022-04-17 ENCOUNTER — Encounter (HOSPITAL_COMMUNITY): Payer: Self-pay | Admitting: Obstetrics and Gynecology

## 2022-04-17 ENCOUNTER — Telehealth: Payer: Self-pay | Admitting: *Deleted

## 2022-04-17 ENCOUNTER — Other Ambulatory Visit: Payer: Self-pay

## 2022-04-17 DIAGNOSIS — O26893 Other specified pregnancy related conditions, third trimester: Secondary | ICD-10-CM | POA: Diagnosis present

## 2022-04-17 DIAGNOSIS — Z98891 History of uterine scar from previous surgery: Secondary | ICD-10-CM

## 2022-04-17 DIAGNOSIS — A5901 Trichomonal vulvovaginitis: Secondary | ICD-10-CM

## 2022-04-17 DIAGNOSIS — U071 COVID-19: Secondary | ICD-10-CM

## 2022-04-17 DIAGNOSIS — Z3A32 32 weeks gestation of pregnancy: Secondary | ICD-10-CM

## 2022-04-17 DIAGNOSIS — O99013 Anemia complicating pregnancy, third trimester: Secondary | ICD-10-CM

## 2022-04-17 DIAGNOSIS — O99333 Smoking (tobacco) complicating pregnancy, third trimester: Secondary | ICD-10-CM

## 2022-04-17 DIAGNOSIS — Z3480 Encounter for supervision of other normal pregnancy, unspecified trimester: Secondary | ICD-10-CM

## 2022-04-17 DIAGNOSIS — O98513 Other viral diseases complicating pregnancy, third trimester: Secondary | ICD-10-CM | POA: Diagnosis not present

## 2022-04-17 DIAGNOSIS — F17210 Nicotine dependence, cigarettes, uncomplicated: Secondary | ICD-10-CM | POA: Insufficient documentation

## 2022-04-17 DIAGNOSIS — A749 Chlamydial infection, unspecified: Secondary | ICD-10-CM

## 2022-04-17 LAB — RESP PANEL BY RT-PCR (FLU A&B, COVID) ARPGX2
Influenza A by PCR: NEGATIVE
Influenza B by PCR: NEGATIVE
SARS Coronavirus 2 by RT PCR: POSITIVE — AB

## 2022-04-17 MED ORDER — ONDANSETRON 4 MG PO TBDP
8.0000 mg | ORAL_TABLET | Freq: Once | ORAL | Status: AC
  Administered 2022-04-17: 8 mg via ORAL
  Filled 2022-04-17: qty 2

## 2022-04-17 MED ORDER — SODIUM CHLORIDE 0.9 % IV SOLN
8.0000 mg | Freq: Once | INTRAVENOUS | Status: DC
  Filled 2022-04-17: qty 4

## 2022-04-17 MED ORDER — ACETAMINOPHEN 500 MG PO TABS
1000.0000 mg | ORAL_TABLET | Freq: Once | ORAL | Status: AC
Start: 2022-04-17 — End: 2022-04-17
  Administered 2022-04-17: 1000 mg via ORAL
  Filled 2022-04-17: qty 2

## 2022-04-17 MED ORDER — LACTATED RINGERS IV BOLUS: 1000.0000 mL | Freq: Once | INTRAVENOUS | Status: DC

## 2022-04-17 MED ORDER — ONDANSETRON 4 MG PO TBDP: 4.0000 mg | ORAL_TABLET | Freq: Three times a day (TID) | ORAL | 0 refills | Status: DC | PRN

## 2022-04-17 NOTE — Discharge Instructions (Signed)

## 2022-04-17 NOTE — MAU Provider Note (Signed)
History     CSN: [id number]  Arrival date and time: 04/17/22 1448   Event Date/Time   First Provider Initiated Contact with Patient 04/17/22 1617      Chief Complaint  Patient presents with   Nausea   Emesis   Chills   Body aches   Diarrhea   [name] is a 26 y.o. year old G81P1001 female at [redacted]w[redacted]d weeks gestation who presents to MAU reporting she had a (+) at-home COVID test. She took the test because of sx's of body aches, body hot to the touch, but having chills, N/V/D, and pain 10/10. She called Mallory Willis and was instructed to take OTC medications for sx's, but she came here instead. She has not taken any medications for her sx's. She receives Mallory Willis with Mallory Willis; next appt is 04/21/2022. The  FOB is present and contributing to the history taking.     OB History     Gravida  2   Para  1   Term  1   Preterm      AB      Living  1      SAB      IAB      Ectopic      Multiple  0   Live Births  1           Past Medical History:  Diagnosis Date   Asthma    Chronic back pain    Gonorrhea    Nausea and vomiting during pregnancy 08/14/2016   Pregnancy induced hypertension    Supervision of normal pregnancy 08/14/2016    Clinic Mallory Willis Initiated Care at   7+2 weeks FOB  Mallory Willis 26 yo bm 2nd Dating By  LMP and [place]  Pap   NA GC/CT Initial:                36+wks: Genetic Screen NT/IT:  CF screen   Declined  Anatomic [place]  Flu vaccine  Tdap Recommended ~ 28wks Glucose Screen  2 hr GBS  Feed Preference  Contraception  Circumcision  Childbirth Classes  Pediatrician      Past Surgical History:  Procedure Laterality Date   CESAREAN SECTION N/A 04/10/2017   Procedure: CESAREAN SECTION;  Surgeon: Mallory Burrow, MD;  Location: [facility];  Service: Obstetrics;  Laterality: N/A;    Mallory History  Problem Relation Age of Onset   Other Mother        degenerative disc   Other Father        heart surgery   Cancer Maternal Grandfather      Social History   Tobacco Use   Smoking status: Every Day    Packs/day: 0.25    Years: 3.00    Total pack years: 0.75    Types: Cigarettes   Smokeless tobacco: Never  Vaping Use   Vaping Use: Never used  Substance Use Topics   Alcohol use: No   Drug use: Not Currently    Types: Marijuana    Allergies:  Allergies  Allergen Reactions   Hydrocodone Hives   Amoxicillin Rash    Has patient had a PCN reaction causing immediate rash, facial/tongue/throat swelling, SOB or lightheadedness with hypotension: Yes Has patient had a PCN reaction causing severe rash involving mucus membranes or skin necrosis: No Has patient had a PCN reaction that required hospitalization: No Has patient had a PCN reaction occurring within the last 10 years: No If all of the  above answers are "NO", then may proceed with Cephalosporin use.   Ceclor [Cefaclor] Rash   Nsaids     Pt reports hx of ulcers   Red Dye Rash   Ultram [Tramadol] Itching    No medications prior to admission.    Review of Systems  Constitutional:  Positive for chills and fatigue.       Body aches  HENT: Negative.    Eyes: Negative.   Respiratory:  Positive for cough.   Cardiovascular: Negative.   Gastrointestinal:  Positive for diarrhea, nausea and vomiting.  Endocrine: Negative.   Musculoskeletal: Negative.   Skin: Negative.   Allergic/Immunologic: Negative.   Neurological: Negative.   Hematological: Negative.   Psychiatric/Behavioral: Negative.     Physical Exam   Blood pressure 134/70, pulse (!) 117, temperature 98.7 F (37.1 C), temperature source Oral, resp. rate 18, height 5\' 9"  (1.753 m), weight 103.9 kg, last menstrual period 01/19/2021, SpO2 99 %.  Physical Exam Vitals and nursing note reviewed.  Constitutional:      Appearance: Normal appearance. She is obese.  Cardiovascular:     Rate and Rhythm: Tachycardia present.     Pulses: Normal pulses.     Heart sounds: Normal heart sounds.  Pulmonary:      Effort: Pulmonary effort is normal.     Breath sounds: Normal breath sounds.  Abdominal:     Palpations: Abdomen is soft.  Genitourinary:    Comments: Not indicated Musculoskeletal:        General: Normal range of motion.     Cervical back: Normal range of motion.  Skin:    General: Skin is warm and dry.  Neurological:     Mental Status: She is alert and oriented to person, place, and time.  Psychiatric:        Mood and Affect: Mood normal.        Behavior: Behavior normal.        Thought Content: Thought content normal.        Judgment: Judgment normal.    REACTIVE NST - FHR: 155 bpm / moderate variability / accels present / decels absent / TOCO: none  MAU Course  Procedures  MDM CCUA COVID swab Tylenol 1000 mg po  LR bolus 1000 ml @ 999 ml/hr -- pt declined Zofran 8 mg IVPB -- pt declined; would prefer ODT dose  Results for orders placed or performed during the Willis encounter of 04/17/22 (from the past 24 hour(s))  Resp Panel by RT-PCR (Flu A&B, Covid) Anterior Nasal Swab     Status: Abnormal   Collection Time: 04/17/22  4:07 PM   Specimen: Anterior Nasal Swab  Result Value Ref Range   SARS Coronavirus 2 by RT PCR POSITIVE (A) NEGATIVE   Influenza A by PCR NEGATIVE NEGATIVE   Influenza B by PCR NEGATIVE NEGATIVE    Assessment and Plan  COVID-19 affecting pregnancy in third trimester  - Information provided on COVID-19 and pregnancy - Advised to treat sx's with OTC medications - Information provided on safe medications in pregnancy - Rest and stay well-hydrated    [redacted] weeks gestation of pregnancy   - Discharge patient - Keep scheduled appt with Mallory Willis, unless feeling sx's - Patient verbalized an understanding of the plan of care and agrees.   06/17/22, CNM 04/17/2022, 4:18 PM

## 2022-04-17 NOTE — Telephone Encounter (Signed)
Pt was diagnosed with Covid today. Pt has body aches, her body is hot but she is cold. + vomiting and diarrhea. Pt was advised to treat the symptoms. Can take Tylenol for fever and body aches. She has Diclegis for vomiting. I advised to take that. + baby movement.

## 2022-04-17 NOTE — Telephone Encounter (Signed)
Pt was advised to take it easy and to push fluids. Pt advised to gradually introduce food. Start with crackers and dry toast. Pt voiced understanding. JSY

## 2022-04-17 NOTE — MAU Note (Signed)
Mallory Willis is a 26 y.o. at [redacted]w[redacted]d here in MAU reporting: had positive Covid 19 test @ home today.  Reports took Covid test secondary chills, N/V/D, body aches.  Denies VB or LOF.  Endorses +FM. LMP: N/A Onset of complaint: yesterday Pain score: 10 There were no vitals filed for this visit.   FHT:173 bpm Lab orders placed from triage:   None

## 2022-04-21 ENCOUNTER — Encounter: Payer: Medicaid Other | Admitting: Women's Health

## 2022-04-23 ENCOUNTER — Encounter: Payer: Medicaid Other | Admitting: Advanced Practice Midwife

## 2022-04-27 ENCOUNTER — Ambulatory Visit (INDEPENDENT_AMBULATORY_CARE_PROVIDER_SITE_OTHER): Payer: Medicaid Other | Admitting: Advanced Practice Midwife

## 2022-04-27 VITALS — BP 119/78 | HR 77 | Wt 233.0 lb

## 2022-04-27 DIAGNOSIS — Z98891 History of uterine scar from previous surgery: Secondary | ICD-10-CM

## 2022-04-27 DIAGNOSIS — O99013 Anemia complicating pregnancy, third trimester: Secondary | ICD-10-CM

## 2022-04-27 DIAGNOSIS — Z113 Encounter for screening for infections with a predominantly sexual mode of transmission: Secondary | ICD-10-CM | POA: Diagnosis not present

## 2022-04-27 DIAGNOSIS — Z3A34 34 weeks gestation of pregnancy: Secondary | ICD-10-CM

## 2022-04-27 DIAGNOSIS — Z3480 Encounter for supervision of other normal pregnancy, unspecified trimester: Secondary | ICD-10-CM

## 2022-04-27 MED ORDER — PRENATAL 19 PO CHEW
1.0000 | CHEWABLE_TABLET | Freq: Every day | ORAL | 11 refills | Status: DC
Start: 2022-04-27 — End: 2022-05-30

## 2022-04-27 NOTE — Patient Instructions (Signed)
Mallory Willis, thank you for choosing our office today! We appreciate the opportunity to meet your healthcare needs. You may receive a short survey by mail, e-mail, or through [employer]. If you are happy with your care we would appreciate if you could take just a few minutes to complete the survey questions. We read all of your comments and take your feedback very seriously. Thank you again for choosing our office.  Center for [employer] Team at [facility]  [facility] at [facility] ([address] [place], [place] [address]) Entrance C, located off of [address] Free 24/7 valet parking   CLASSES: Go to [email or web address] to register for classes (childbirth, breastfeeding, waterbirth, infant CPR, daddy bootcamp, etc.)  Call the office [phone]) or go to [facility] if: You begin to have strong, frequent contractions Your water breaks.  Sometimes it is a big gush of fluid, sometimes it is just a trickle that keeps getting your panties wet or running down your legs You have vaginal bleeding.  It is normal to have a small amount of spotting if your cervix was checked.  You don't feel your baby moving like normal.  If you don't, get you something to eat and drink and lay down and focus on feeling your baby move.   If your baby is still not moving like normal, you should call the office or go to [facility]  Call the office [phone]) or go to [facility] hospital for these signs of pre-eclampsia: Severe headache that does not go away with Tylenol Visual changes- seeing spots, double, blurred vision Pain under your right breast or upper abdomen that does not go away with Tums or heartburn medicine Nausea and/or vomiting Severe swelling in your hands, feet, and face   Tdap Vaccine It is recommended that you get the Tdap vaccine during the third trimester of EACH pregnancy to help protect your baby from getting pertussis (whooping cough) 27-36 weeks is the BEST time to do  this so that you can pass the protection on to your baby. During pregnancy is better than after pregnancy, but if you are unable to get it during pregnancy it will be offered at the hospital.  You can get this vaccine with [place], at the health department, your family doctor, or some local pharmacies Everyone who will be around your baby should also be up-to-date on their vaccines before the baby comes. Adults (who are not pregnant) only need 1 dose of Tdap during adulthood.   [facility] Pediatricians/Family Doctors [facility]): [address] Colette Ribas, [phone]           [facility]: [address], [phone]                [facility]): [address], [phone] (call to ask if accepting patients) [facility] Department: [address], [employer], [phone]    [facility] Pediatricians/Family Doctors [facility]): [phone] [address], [phone] [facility]: [address] [place], [phone] [facility]: [address], [phone]  [facility] Doctors  [facility]): [phone] [facility]: [address], [phone]   [facility] Doctors [facility]: [address] [phone]  [facility] Family Doctors  [facility]: [phone], [phone]  Home Blood Pressure Monitoring for Patients   Your provider has recommended that you check your  blood pressure (BP) at least once a week at home. If you do not have a blood pressure cuff at home, one will be provided for you. Contact your provider if you have not received your monitor within 1 week.   Helpful Tips for Accurate Home Blood Pressure Checks  Don't smoke, exercise, or drink caffeine 30 minutes before checking your BP Use the restroom before checking your BP (a full bladder can raise your  pressure) Relax in a comfortable upright chair Feet on the ground Left arm resting comfortably on a flat surface at the level of your heart Legs uncrossed Back supported Sit quietly and don't talk Place the cuff on your bare arm Adjust snuggly, so that only two fingertips can fit between your skin and the top of the cuff Check 2 readings separated by at least one minute Keep a log of your BP readings For a visual, please reference this diagram: http://ccnc.care/bpdiagram  Provider Name: [facility]     Phone: [phone]  Zone 1: ALL CLEAR  Continue to monitor your symptoms:  BP reading is less than 140 (top number) or less than 90 (bottom number)  No right upper stomach pain No headaches or seeing spots No feeling nauseated or throwing up No swelling in face and hands  Zone 2: CAUTION Call your doctor's office for any of the following:  BP reading is greater than 140 (top number) or greater than 90 (bottom number)  Stomach pain under your ribs in the middle or right side Headaches or seeing spots Feeling nauseated or throwing up Swelling in face and hands  Zone 3: EMERGENCY  Seek immediate medical care if you have any of the following:  BP reading is greater than160 (top number) or greater than 110 (bottom number) Severe headaches not improving with Tylenol Serious difficulty catching your breath Any worsening symptoms from Zone 2   Third Trimester of Pregnancy The third trimester is from week 29 through week 42, months 7 through 9. The third trimester is a time when the fetus is growing rapidly. At the end of the ninth month, the fetus is about 20 inches in length and weighs 6-10 pounds.  BODY CHANGES Your body goes through many changes during pregnancy. The changes vary from woman to woman.  Your weight will continue to increase. You can expect to gain 25-35 pounds (11-16 kg) by the end of the pregnancy. You may begin to get stretch marks on your hips, abdomen,  and breasts. You may urinate more often because the fetus is moving lower into your pelvis and pressing on your bladder. You may develop or continue to have heartburn as a result of your pregnancy. You may develop constipation because certain hormones are causing the muscles that push waste through your intestines to slow down. You may develop hemorrhoids or swollen, bulging veins (varicose veins). You may have pelvic pain because of the weight gain and pregnancy hormones relaxing your joints between the bones in your pelvis. Backaches may result from overexertion of the muscles supporting your posture. You may have changes in your hair. These can include thickening of your hair, rapid growth, and changes in texture. Some women also have hair loss during or after pregnancy, or hair that feels dry or thin. Your hair will most likely return to normal after your baby is born. Your breasts will continue to grow and be tender. A yellow discharge may leak from your breasts called colostrum. Your belly button may stick out. You may   feel short of breath because of your expanding uterus. You may notice the fetus "dropping," or moving lower in your abdomen. You may have a bloody mucus discharge. This usually occurs a few days to a week before labor begins. Your cervix becomes thin and soft (effaced) near your due date. WHAT TO EXPECT AT YOUR PRENATAL EXAMS  You will have prenatal exams every 2 weeks until week 36. Then, you will have weekly prenatal exams. During a routine prenatal visit: You will be weighed to make sure you and the fetus are growing normally. Your blood pressure is taken. Your abdomen will be measured to track your baby's growth. The fetal heartbeat will be listened to. Any test results from the previous visit will be discussed. You may have a cervical check near your due date to see if you have effaced. At around 36 weeks, your caregiver will check your cervix. At the same time, your  caregiver will also perform a test on the secretions of the vaginal tissue. This test is to determine if a type of bacteria, Group B streptococcus, is present. Your caregiver will explain this further. Your caregiver may ask you: What your birth plan is. How you are feeling. If you are feeling the baby move. If you have had any abnormal symptoms, such as leaking fluid, bleeding, severe headaches, or abdominal cramping. If you have any questions. Other tests or screenings that may be performed during your third trimester include: Blood tests that check for low iron levels (anemia). Fetal testing to check the health, activity level, and growth of the fetus. Testing is done if you have certain medical conditions or if there are problems during the pregnancy. FALSE LABOR You may feel small, irregular contractions that eventually go away. These are called Braxton Hicks contractions, or false labor. Contractions may last for hours, days, or even weeks before true labor sets in. If contractions come at regular intervals, intensify, or become painful, it is best to be seen by your caregiver.  SIGNS OF LABOR  Menstrual-like cramps. Contractions that are 5 minutes apart or less. Contractions that start on the top of the uterus and spread down to the lower abdomen and back. A sense of increased pelvic pressure or back pain. A watery or bloody mucus discharge that comes from the vagina. If you have any of these signs before the 37th week of pregnancy, call your caregiver right away. You need to go to the hospital to get checked immediately. HOME CARE INSTRUCTIONS  Avoid all smoking, herbs, alcohol, and unprescribed drugs. These chemicals affect the formation and growth of the baby. Follow your caregiver's instructions regarding medicine use. There are medicines that are either safe or unsafe to take during pregnancy. Exercise only as directed by your caregiver. Experiencing uterine cramps is a good sign to  stop exercising. Continue to eat regular, healthy meals. Wear a good support bra for breast tenderness. Do not use hot tubs, steam rooms, or saunas. Wear your seat belt at all times when driving. Avoid raw meat, uncooked cheese, cat litter boxes, and soil used by cats. These carry germs that can cause birth defects in the baby. Take your prenatal vitamins. Try taking a stool softener (if your caregiver approves) if you develop constipation. Eat more high-fiber foods, such as fresh vegetables or fruit and whole grains. Drink plenty of fluids to keep your urine clear or pale yellow. Take warm sitz baths to soothe any pain or discomfort caused by hemorrhoids. Use hemorrhoid cream if   your caregiver approves. If you develop varicose veins, wear support hose. Elevate your feet for 15 minutes, 3-4 times a day. Limit salt in your diet. Avoid heavy lifting, wear low heal shoes, and practice good posture. Rest a lot with your legs elevated if you have leg cramps or low back pain. Visit your dentist if you have not gone during your pregnancy. Use a soft toothbrush to brush your teeth and be gentle when you floss. A sexual relationship may be continued unless your caregiver directs you otherwise. Do not travel far distances unless it is absolutely necessary and only with the approval of your caregiver. Take prenatal classes to understand, practice, and ask questions about the labor and delivery. Make a trial run to the hospital. Pack your hospital bag. Prepare the baby's nursery. Continue to go to all your prenatal visits as directed by your caregiver. SEEK MEDICAL CARE IF: You are unsure if you are in labor or if your water has broken. You have dizziness. You have mild pelvic cramps, pelvic pressure, or nagging pain in your abdominal area. You have persistent nausea, vomiting, or diarrhea. You have a bad smelling vaginal discharge. You have pain with urination. SEEK IMMEDIATE MEDICAL CARE IF:  You  have a fever. You are leaking fluid from your vagina. You have spotting or bleeding from your vagina. You have severe abdominal cramping or pain. You have rapid weight loss or gain. You have shortness of breath with chest pain. You notice sudden or extreme swelling of your face, hands, ankles, feet, or legs. You have not felt your baby move in over an hour. You have severe headaches that do not go away with medicine. You have vision changes. Document Released: 08/25/2001 Document Revised: 09/05/2013 Document Reviewed: 11/01/2012 [facility] Patient Information 2015 [place], [place]. This information is not intended to replace advice given to you by your health care provider. Make sure you discuss any questions you have with your health care provider.

## 2022-04-27 NOTE — Progress Notes (Signed)
   LOW-RISK PREGNANCY VISIT Patient name: Mallory Willis MRN [id number]  Date of birth: 25-May-1996 Chief Complaint:   Routine Prenatal Visit  History of Present Illness:   Mallory Willis is a 26 y.o. G103P1001 female at [redacted]w[redacted]d with an Estimated Date of Delivery: 06/08/22 being seen today for ongoing management of a low-risk pregnancy.  Today she reports  having Covid at the beginning of the month but feeling better now; has bouts of irreg ctx that resolve when she sits down; had sex w partner and she isn't sure if he got trich/chlam treated last spring . Contractions: Irritability. Vag. Bleeding: None.  Movement: Present. denies leaking of fluid. Review of Systems:   Pertinent items are noted in HPI Denies abnormal vaginal discharge w/ itching/odor/irritation, headaches, visual changes, shortness of breath, chest pain, abdominal pain, severe nausea/vomiting, or problems with urination or bowel movements unless otherwise stated above. Pertinent History Reviewed:  Reviewed past medical,surgical, social, obstetrical and family history.  Reviewed problem list, medications and allergies. Physical Assessment:   Vitals:   04/27/22 0841  BP: 119/78  Pulse: 77  Weight: 233 lb (105.7 kg)  Body mass index is 34.41 kg/m.        Physical Examination:   General appearance: Well appearing, and in no distress  Mental status: Alert, oriented to person, place, and time  Skin: Warm & dry  Cardiovascular: Normal heart rate noted  Respiratory: Normal respiratory effort, no distress  Abdomen: Soft, gravid, nontender  Pelvic: Cervical exam deferred         Extremities: Edema: None  Fetal Status: Fetal Heart Rate (bpm): 138 Fundal Height: 34 cm Movement: Present    No results found for this or any previous visit (from the past 24 hour(s)).  Assessment & Plan:  1) Low-risk pregnancy G2P1001 at [redacted]w[redacted]d with an Estimated Date of Delivery: 06/08/22   2) Prev C/S, wants TOLAC (consent under media 04/07/22)  3)  Considering BTL, LARCs also reviewed (consent under media 04/07/22)  4) STD screening today due to potential exposure, will only need GBS at next visit   Meds:  Meds ordered this encounter  Medications   Prenatal Vit-Fe Fumarate-FA (PRENATAL 19) tablet    Sig: Chew 1 tablet by mouth daily.    Dispense:  30 tablet    Refill:  11    Order Specific Question:   Supervising Provider    Answer:   Lazaro Arms [[id number]]   Labs/procedures today: GC/chlam/trich  Plan:  Continue routine obstetrical care   Reviewed: Preterm labor symptoms and general obstetric precautions including but not limited to vaginal bleeding, contractions, leaking of fluid and fetal movement were reviewed in detail with the patient.  All questions were answered. Didn't ask about home bp cuff. Check bp weekly, let us know if >140/90.   Follow-up: Return in about 2 weeks (around 05/11/2022) for LROB with GBS only.  Orders Placed This Encounter  Procedures   GC/Chlamydia Probe Amp   Trichomonas vaginalis, RNA   Arabella Merles [facility] 04/27/2022 9:07 AM

## 2022-04-30 ENCOUNTER — Encounter: Payer: Self-pay | Admitting: Women's Health

## 2022-04-30 ENCOUNTER — Other Ambulatory Visit: Payer: Self-pay | Admitting: Advanced Practice Midwife

## 2022-04-30 DIAGNOSIS — O98813 Other maternal infectious and parasitic diseases complicating pregnancy, third trimester: Secondary | ICD-10-CM

## 2022-04-30 DIAGNOSIS — A749 Chlamydial infection, unspecified: Secondary | ICD-10-CM

## 2022-04-30 DIAGNOSIS — A5901 Trichomonal vulvovaginitis: Secondary | ICD-10-CM

## 2022-04-30 LAB — TRICHOMONAS VAGINALIS, PROBE AMP: Trich vag by NAA: POSITIVE — AB

## 2022-04-30 LAB — GC/CHLAMYDIA PROBE AMP
Chlamydia trachomatis, NAA: POSITIVE — AB
Neisseria Gonorrhoeae by PCR: NEGATIVE

## 2022-04-30 MED ORDER — AZITHROMYCIN 500 MG PO TABS: ORAL_TABLET | ORAL | 0 refills | Status: DC

## 2022-04-30 MED ORDER — METRONIDAZOLE 500 MG PO TABS: 500.0000 mg | ORAL_TABLET | Freq: Two times a day (BID) | ORAL | 0 refills | Status: DC

## 2022-05-11 ENCOUNTER — Ambulatory Visit (INDEPENDENT_AMBULATORY_CARE_PROVIDER_SITE_OTHER): Payer: Medicaid Other | Admitting: Women's Health

## 2022-05-11 ENCOUNTER — Encounter: Payer: Self-pay | Admitting: Women's Health

## 2022-05-11 VITALS — BP 125/66 | HR 92 | Wt 236.0 lb

## 2022-05-11 DIAGNOSIS — Z3A36 36 weeks gestation of pregnancy: Secondary | ICD-10-CM | POA: Diagnosis not present

## 2022-05-11 DIAGNOSIS — Z3483 Encounter for supervision of other normal pregnancy, third trimester: Secondary | ICD-10-CM | POA: Diagnosis not present

## 2022-05-11 NOTE — Patient Instructions (Signed)
Mallory Willis, thank you for choosing our office today! We appreciate the opportunity to meet your healthcare needs. You may receive a short survey by mail, e-mail, or through [employer]. If you are happy with your care we would appreciate if you could take just a few minutes to complete the survey questions. We read all of your comments and take your feedback very seriously. Thank you again for choosing our office.  Center for [employer] Team at [facility]  [facility] at [facility] ([address] [place], [place] [address]) Entrance C, located off of [address] Free 24/7 valet parking   CLASSES: Go to [email or web address] to register for classes (childbirth, breastfeeding, waterbirth, infant CPR, daddy bootcamp, etc.)  Call the office [phone]) or go to [facility] if: You begin to have strong, frequent contractions Your water breaks.  Sometimes it is a big gush of fluid, sometimes it is just a trickle that keeps getting your panties wet or running down your legs You have vaginal bleeding.  It is normal to have a small amount of spotting if your cervix was checked.  You don't feel your baby moving like normal.  If you don't, get you something to eat and drink and lay down and focus on feeling your baby move.   If your baby is still not moving like normal, you should call the office or go to [facility].  Call the office [phone]) or go to [facility] hospital for these signs of pre-eclampsia: Severe headache that does not go away with Tylenol Visual changes- seeing spots, double, blurred vision Pain under your right breast or upper abdomen that does not go away with Tums or heartburn medicine Nausea and/or vomiting Severe swelling in your hands, feet, and face   Tdap Vaccine It is recommended that you get the Tdap vaccine during the third trimester of EACH pregnancy to help protect your baby from getting pertussis (whooping cough) 27-36 weeks is the BEST time to do  this so that you can pass the protection on to your baby. During pregnancy is better than after pregnancy, but if you are unable to get it during pregnancy it will be offered at the hospital.  You can get this vaccine with [place], at the health department, your family doctor, or some local pharmacies Everyone who will be around your baby should also be up-to-date on their vaccines before the baby comes. Adults (who are not pregnant) only need 1 dose of Tdap during adulthood.   [facility] Pediatricians/Family Doctors [facility]): [address] Colette Ribas, [phone]           [facility]: [address], [phone]                [facility] Medicine [facility]): [address], [phone] (call to ask if accepting patients) [facility] Department: [address], [place], [phone]    [facility] Pediatricians/Family Doctors [facility]): [phone], [phone] [facility]: [address] [place], [phone] [facility] [place]: [address], [phone]  [facility] Doctors  [facility]): [phone] [facility]: [address], [phone]   [facility] Doctors [facility]: [address], [phone]  [facility] Family Doctors  [facility]: [phone], [phone]  Home Blood Pressure Monitoring for Patients   Your provider has recommended that you check your  blood pressure (BP) at least once a week at home. If you do not have a blood pressure cuff at home, one will be provided for you. Contact your provider if you have not received your monitor within 1 week.   Helpful Tips for Accurate Home Blood Pressure Checks  Don't smoke, exercise, or drink caffeine 30 minutes before checking your BP Use the restroom before checking your BP (a full bladder can raise your  pressure) Relax in a comfortable upright chair Feet on the ground Left arm resting comfortably on a flat surface at the level of your heart Legs uncrossed Back supported Sit quietly and don't talk Place the cuff on your bare arm Adjust snuggly, so that only two fingertips can fit between your skin and the top of the cuff Check 2 readings separated by at least one minute Keep a log of your BP readings For a visual, please reference this diagram: http://ccnc.care/bpdiagram  Provider Name: [facility]     Phone: [phone]  Zone 1: ALL CLEAR  Continue to monitor your symptoms:  BP reading is less than 140 (top number) or less than 90 (bottom number)  No right upper stomach pain No headaches or seeing spots No feeling nauseated or throwing up No swelling in face and hands  Zone 2: CAUTION Call your doctor's office for any of the following:  BP reading is greater than 140 (top number) or greater than 90 (bottom number)  Stomach pain under your ribs in the middle or right side Headaches or seeing spots Feeling nauseated or throwing up Swelling in face and hands  Zone 3: EMERGENCY  Seek immediate medical care if you have any of the following:  BP reading is greater than160 (top number) or greater than 110 (bottom number) Severe headaches not improving with Tylenol Serious difficulty catching your breath Any worsening symptoms from Zone 2  Preterm Labor and Birth Information  The normal length of a pregnancy is 39-41 weeks. Preterm labor is when labor starts before 37 completed weeks of pregnancy. What are the risk factors for preterm labor? Preterm labor is more likely to occur in women who: Have certain infections during pregnancy such as a bladder infection, sexually transmitted infection, or infection inside the uterus (chorioamnionitis). Have a shorter-than-normal cervix. Have gone into preterm labor before. Have had surgery on their cervix. Are younger than age 17  or older than age 35. Are African American. Are pregnant with twins or multiple babies (multiple gestation). Take street drugs or smoke while pregnant. Do not gain enough weight while pregnant. Became pregnant shortly after having been pregnant. What are the symptoms of preterm labor? Symptoms of preterm labor include: Cramps similar to those that can happen during a menstrual period. The cramps may happen with diarrhea. Pain in the abdomen or lower back. Regular uterine contractions that may feel like tightening of the abdomen. A feeling of increased pressure in the pelvis. Increased watery or bloody mucus discharge from the vagina. Water breaking (ruptured amniotic sac). Why is it important to recognize signs of preterm labor? It is important to recognize signs of preterm labor because babies who are born prematurely may not be fully developed. This can put them at an increased risk for: Long-term (chronic) heart and lung problems. Difficulty immediately after birth with regulating body systems, including blood sugar, body temperature, heart rate, and breathing rate. Bleeding in the brain. Cerebral palsy. Learning difficulties. Death. These risks are highest for babies who are born before 34 weeks   of pregnancy. How is preterm labor treated? Treatment depends on the length of your pregnancy, your condition, and the health of your baby. It may involve: Having a stitch (suture) placed in your cervix to prevent your cervix from opening too early (cerclage). Taking or being given medicines, such as: Hormone medicines. These may be given early in pregnancy to help support the pregnancy. Medicine to stop contractions. Medicines to help mature the baby's lungs. These may be prescribed if the risk of delivery is high. Medicines to prevent your baby from developing cerebral palsy. If the labor happens before 34 weeks of pregnancy, you may need to stay in the hospital. What should I do if I  think I am in preterm labor? If you think that you are going into preterm labor, call your health care provider right away. How can I prevent preterm labor in future pregnancies? To increase your chance of having a full-term pregnancy: Do not use any tobacco products, such as cigarettes, chewing tobacco, and e-cigarettes. If you need help quitting, ask your health care provider. Do not use street drugs or medicines that have not been prescribed to you during your pregnancy. Talk with your health care provider before taking any herbal supplements, even if you have been taking them regularly. Make sure you gain a healthy amount of weight during your pregnancy. Watch for infection. If you think that you might have an infection, get it checked right away. Make sure to tell your health care provider if you have gone into preterm labor before. This information is not intended to replace advice given to you by your health care provider. Make sure you discuss any questions you have with your health care provider. Document Revised: 12/23/2018 Document Reviewed: 01/22/2016 Elsevier Patient Education  2020 Elsevier Inc.   

## 2022-05-11 NOTE — Progress Notes (Signed)
LOW-RISK PREGNANCY VISIT Patient name: Mallory Willis MRN [id number]  Date of birth: 1996-08-21 Chief Complaint:   Routine Prenatal Visit  History of Present Illness:   Mallory Willis is a 26 y.o. G80P1001 female at [redacted]w[redacted]d with an Estimated Date of Delivery: 06/08/22 being seen today for ongoing management of a low-risk pregnancy.   Today she reports no complaints. Contractions: Irritability. Vag. Bleeding: None.  Movement: Present. denies leaking of fluid.     03/24/2022    9:36 AM 12/05/2021    9:13 AM 08/14/2016    1:27 PM  Depression screen PHQ 2/9  Decreased Interest 1 0 0  Down, Depressed, Hopeless 0 0 0  PHQ - 2 Score 1 0 0  Altered sleeping 0 0 0  Tired, decreased energy 1 1 0  Change in appetite 1 1 2   Feeling bad or failure about yourself  0 0 0  Trouble concentrating 0 0 0  Moving slowly or fidgety/restless 0 0 0  Suicidal thoughts 0 0 0  PHQ-9 Score 3 2 2         03/24/2022    9:36 AM 12/05/2021    9:14 AM  GAD 7 : Generalized Anxiety Score  Nervous, Anxious, on Edge 0 0  Control/stop worrying 0 0  Worry too much - different things 0 0  Trouble relaxing 1 0  Restless 0 0  Easily annoyed or irritable 1 1  Afraid - awful might happen 0 0  Total GAD 7 Score 2 1      Review of Systems:   Pertinent items are noted in HPI Denies abnormal vaginal discharge w/ itching/odor/irritation, headaches, visual changes, shortness of breath, chest pain, abdominal pain, severe nausea/vomiting, or problems with urination or bowel movements unless otherwise stated above. Pertinent History Reviewed:  Reviewed past medical,surgical, social, obstetrical and family history.  Reviewed problem list, medications and allergies. Physical Assessment:   Vitals:   05/11/22 1158  BP: 125/66  Pulse: 92  Weight: 236 lb (107 kg)  Body mass index is 34.85 kg/m.        Physical Examination:   General appearance: Well appearing, and in no distress  Mental status: Alert, oriented to person,  place, and time  Skin: Warm & dry  Cardiovascular: Normal heart rate noted  Respiratory: Normal respiratory effort, no distress  Abdomen: Soft, gravid, nontender  Pelvic: Cervical exam performed  Dilation: Closed Effacement (%): Thick Station: Ballotable  Extremities: Edema: None  Fetal Status: Fetal Heart Rate (bpm): 138 Fundal Height: 36 cm Movement: Present Presentation: Vertex  Chaperone: Latisha Cresenzo   No results found for this or any previous visit (from the past 24 hour(s)).  Assessment & Plan:  1) Low-risk pregnancy G2P1001 at [redacted]w[redacted]d with an Estimated Date of Delivery: 06/08/22   2) Prev c/s, wants TOLAC, consent signed 7/25  3) H/O GHTN> ASA, says she never received bp cuff- note routed to Tish to check on this for her. Reviewed pre-e s/s, reasons to seek care  4) Recent +CT and trich> states she finished all meds, hasn't had sex w/ partner and doesn't plan to- doesn't know if he took/finished his meds   Meds: No orders of the defined types were placed in this encounter.  Labs/procedures today: GBS and SVE  Plan:  Continue routine obstetrical care  Next visit: prefers in person    Reviewed: Preterm labor symptoms and general obstetric precautions including but not limited to vaginal bleeding, contractions, leaking of fluid and fetal movement were reviewed  in detail with the patient.  All questions were answered. Does not have home bp cuff. Office bp cuff given: no.   Follow-up: Return for weekly, CNM, in person, [facility].  Future Appointments  Date Time Provider Department Center  05/21/2022 10:50 AM Myna Hidalgo, DO [facility]  05/28/2022 10:30 AM Jacklyn Shell, CNM [facility]  06/04/2022 11:30 AM Marzella Schlein, Scarlette Calico, CNM [facility]    Orders Placed This Encounter  Procedures   Strep Gp B NAA+Rflx   Cheral Marker CNM, [facility] 05/11/2022 12:44 PM

## 2022-05-15 LAB — STREP GP B NAA+RFLX: Strep Gp B NAA+Rflx: POSITIVE — AB

## 2022-05-15 LAB — STREP GP B SUSCEPTIBILITY

## 2022-05-21 ENCOUNTER — Encounter: Payer: Medicaid Other | Admitting: Obstetrics & Gynecology

## 2022-05-25 ENCOUNTER — Telehealth: Payer: Self-pay | Admitting: *Deleted

## 2022-05-25 NOTE — Telephone Encounter (Signed)
Pt is having a lot of pressure, back to back. + tightening in belly, not enough to time. + baby movement. No spotting or leaking fluid. Pt was advised to watch it for now. If she starts having decreased baby movement, spotting or leaking fluid, let us know. Contractions will get more frequent. Pt was advised to take it easy, drink lots of fluids and let us know if anything changes. Pt voiced understanding. JSY

## 2022-05-28 ENCOUNTER — Encounter (HOSPITAL_COMMUNITY): Payer: Self-pay | Admitting: Obstetrics and Gynecology

## 2022-05-28 ENCOUNTER — Inpatient Hospital Stay (HOSPITAL_COMMUNITY): Payer: Medicaid Other | Admitting: Anesthesiology

## 2022-05-28 ENCOUNTER — Inpatient Hospital Stay (HOSPITAL_COMMUNITY)
Admission: AD | Admit: 2022-05-28 | Discharge: 2022-05-30 | DRG: 807 | Disposition: A | Discharge status: Home / Self Care | Payer: Medicaid Other | Attending: Obstetrics and Gynecology | Admitting: Obstetrics and Gynecology

## 2022-05-28 ENCOUNTER — Other Ambulatory Visit: Payer: Self-pay

## 2022-05-28 ENCOUNTER — Encounter: Payer: Medicaid Other | Admitting: Advanced Practice Midwife

## 2022-05-28 DIAGNOSIS — A5901 Trichomonal vulvovaginitis: Secondary | ICD-10-CM | POA: Diagnosis present

## 2022-05-28 DIAGNOSIS — O9902 Anemia complicating childbirth: Secondary | ICD-10-CM | POA: Diagnosis not present

## 2022-05-28 DIAGNOSIS — F17210 Nicotine dependence, cigarettes, uncomplicated: Secondary | ICD-10-CM | POA: Diagnosis present

## 2022-05-28 DIAGNOSIS — Z3481 Encounter for supervision of other normal pregnancy, first trimester: Secondary | ICD-10-CM | POA: Diagnosis not present

## 2022-05-28 DIAGNOSIS — O34219 Maternal care for unspecified type scar from previous cesarean delivery: Secondary | ICD-10-CM | POA: Diagnosis not present

## 2022-05-28 DIAGNOSIS — Z3A38 38 weeks gestation of pregnancy: Secondary | ICD-10-CM

## 2022-05-28 DIAGNOSIS — O134 Gestational [pregnancy-induced] hypertension without significant proteinuria, complicating childbirth: Secondary | ICD-10-CM | POA: Diagnosis not present

## 2022-05-28 DIAGNOSIS — A749 Chlamydial infection, unspecified: Secondary | ICD-10-CM | POA: Diagnosis present

## 2022-05-28 DIAGNOSIS — Z880 Allergy status to penicillin: Secondary | ICD-10-CM | POA: Diagnosis not present

## 2022-05-28 DIAGNOSIS — O43813 Placental infarction, third trimester: Secondary | ICD-10-CM | POA: Diagnosis not present

## 2022-05-28 DIAGNOSIS — O411230 Chorioamnionitis, third trimester, not applicable or unspecified: Secondary | ICD-10-CM | POA: Diagnosis not present

## 2022-05-28 DIAGNOSIS — O99334 Smoking (tobacco) complicating childbirth: Secondary | ICD-10-CM | POA: Diagnosis present

## 2022-05-28 DIAGNOSIS — O26893 Other specified pregnancy related conditions, third trimester: Secondary | ICD-10-CM | POA: Diagnosis not present

## 2022-05-28 DIAGNOSIS — E669 Obesity, unspecified: Secondary | ICD-10-CM | POA: Diagnosis not present

## 2022-05-28 DIAGNOSIS — O98812 Other maternal infectious and parasitic diseases complicating pregnancy, second trimester: Secondary | ICD-10-CM | POA: Diagnosis present

## 2022-05-28 DIAGNOSIS — O99214 Obesity complicating childbirth: Secondary | ICD-10-CM | POA: Diagnosis not present

## 2022-05-28 DIAGNOSIS — Z3490 Encounter for supervision of normal pregnancy, unspecified, unspecified trimester: Secondary | ICD-10-CM

## 2022-05-28 DIAGNOSIS — O99820 Streptococcus B carrier state complicating pregnancy: Secondary | ICD-10-CM | POA: Diagnosis not present

## 2022-05-28 DIAGNOSIS — Z8759 Personal history of other complications of pregnancy, childbirth and the puerperium: Secondary | ICD-10-CM

## 2022-05-28 DIAGNOSIS — O135 Gestational [pregnancy-induced] hypertension without significant proteinuria, complicating the puerperium: Secondary | ICD-10-CM | POA: Diagnosis not present

## 2022-05-28 DIAGNOSIS — O99330 Smoking (tobacco) complicating pregnancy, unspecified trimester: Secondary | ICD-10-CM | POA: Diagnosis present

## 2022-05-28 DIAGNOSIS — O99824 Streptococcus B carrier state complicating childbirth: Secondary | ICD-10-CM | POA: Diagnosis present

## 2022-05-28 DIAGNOSIS — O34211 Maternal care for low transverse scar from previous cesarean delivery: Secondary | ICD-10-CM | POA: Diagnosis not present

## 2022-05-28 DIAGNOSIS — O326XX0 Maternal care for compound presentation, not applicable or unspecified: Secondary | ICD-10-CM | POA: Diagnosis not present

## 2022-05-28 DIAGNOSIS — Z98891 History of uterine scar from previous surgery: Secondary | ICD-10-CM

## 2022-05-28 DIAGNOSIS — O99019 Anemia complicating pregnancy, unspecified trimester: Secondary | ICD-10-CM | POA: Diagnosis present

## 2022-05-28 LAB — TYPE AND SCREEN
ABO/RH(D): O POS
Antibody Screen: NEGATIVE

## 2022-05-28 LAB — CBC
HCT: 33.3 % — ABNORMAL LOW (ref 36.0–46.0)
Hemoglobin: 10.6 g/dL — ABNORMAL LOW (ref 12.0–15.0)
MCH: 26.1 pg (ref 26.0–34.0)
MCHC: 31.8 g/dL (ref 30.0–36.0)
MCV: 82 fL (ref 80.0–100.0)
Platelets: 284 10*3/uL (ref 150–400)
RBC: 4.06 MIL/uL (ref 3.87–5.11)
RDW: 17.8 % — ABNORMAL HIGH (ref 11.5–15.5)
WBC: 12 10*3/uL — ABNORMAL HIGH (ref 4.0–10.5)
nRBC: 0 % (ref 0.0–0.2)

## 2022-05-28 LAB — PROTEIN / CREATININE RATIO, URINE
Creatinine, Urine: 91 mg/dL
Protein Creatinine Ratio: 0.18 mg/mg{Cre} — ABNORMAL HIGH (ref 0.00–0.15)
Total Protein, Urine: 16 mg/dL

## 2022-05-28 LAB — COMPREHENSIVE METABOLIC PANEL
ALT: <5 U/L (ref 0–44)
AST: 13 U/L — ABNORMAL LOW (ref 15–41)
Albumin: 2.8 g/dL — ABNORMAL LOW (ref 3.5–5.0)
Alkaline Phosphatase: 130 U/L — ABNORMAL HIGH (ref 38–126)
Anion gap: 8 (ref 5–15)
BUN: 5 mg/dL — ABNORMAL LOW (ref 6–20)
CO2: 23 mmol/L (ref 22–32)
Calcium: 9.1 mg/dL (ref 8.9–10.3)
Chloride: 106 mmol/L (ref 98–111)
Creatinine, Ser: 0.58 mg/dL (ref 0.44–1.00)
GFR, Estimated: >60 mL/min (ref >60)
Glucose, Bld: 83 mg/dL (ref 70–99)
Potassium: 3.9 mmol/L (ref 3.5–5.1)
Sodium: 137 mmol/L (ref 135–145)
Total Bilirubin: 0.3 mg/dL (ref 0.3–1.2)
Total Protein: 6 g/dL — ABNORMAL LOW (ref 6.5–8.1)

## 2022-05-28 LAB — RPR: RPR Ser Ql: NONREACTIVE

## 2022-05-28 MED ORDER — TETANUS-DIPHTH-ACELL PERTUSSIS 5-2.5-18.5 LF-MCG/0.5 IM SUSY: 0.5000 mL | PREFILLED_SYRINGE | Freq: Once | INTRAMUSCULAR | Status: DC

## 2022-05-28 MED ORDER — LACTATED RINGERS IV SOLN
500.0000 mL | Freq: Once | INTRAVENOUS | Status: AC
  Administered 2022-05-28: 500 mL via INTRAVENOUS

## 2022-05-28 MED ORDER — LIDOCAINE HCL (PF) 1 % IJ SOLN
INTRAMUSCULAR | Status: DC | PRN
  Administered 2022-05-28: 5 mL via EPIDURAL

## 2022-05-28 MED ORDER — LACTATED RINGERS IV SOLN: 500.0000 mL | INTRAVENOUS | Status: DC | PRN

## 2022-05-28 MED ORDER — ONDANSETRON HCL 4 MG PO TABS: 4.0000 mg | ORAL_TABLET | ORAL | Status: DC | PRN

## 2022-05-28 MED ORDER — DIPHENHYDRAMINE HCL 50 MG/ML IJ SOLN
12.5000 mg | INTRAMUSCULAR | Status: DC | PRN
  Administered 2022-05-28 (×2): 12.5 mg via INTRAVENOUS
  Filled 2022-05-28: qty 1

## 2022-05-28 MED ORDER — OXYTOCIN BOLUS FROM INFUSION
333.0000 mL | Freq: Once | INTRAVENOUS | Status: AC
  Administered 2022-05-28: 333 mL via INTRAVENOUS

## 2022-05-28 MED ORDER — ACETAMINOPHEN 325 MG PO TABS: 650.0000 mg | ORAL_TABLET | ORAL | Status: DC | PRN

## 2022-05-28 MED ORDER — SOD CITRATE-CITRIC ACID 500-334 MG/5ML PO SOLN: 30.0000 mL | ORAL | Status: DC | PRN

## 2022-05-28 MED ORDER — WITCH HAZEL-GLYCERIN EX PADS: 1.0000 | MEDICATED_PAD | CUTANEOUS | Status: DC | PRN

## 2022-05-28 MED ORDER — ONDANSETRON HCL 4 MG/2ML IJ SOLN
4.0000 mg | Freq: Four times a day (QID) | INTRAMUSCULAR | Status: DC | PRN
  Administered 2022-05-28 (×2): 4 mg via INTRAVENOUS
  Filled 2022-05-28 (×2): qty 2

## 2022-05-28 MED ORDER — COCONUT OIL OIL: 1.0000 | TOPICAL_OIL | Status: DC | PRN

## 2022-05-28 MED ORDER — OXYCODONE-ACETAMINOPHEN 5-325 MG PO TABS: 1.0000 | ORAL_TABLET | ORAL | Status: DC | PRN

## 2022-05-28 MED ORDER — FENTANYL-BUPIVACAINE-NACL 0.5-0.125-0.9 MG/250ML-% EP SOLN
12.0000 mL/h | EPIDURAL | Status: DC | PRN
  Filled 2022-05-28: qty 250

## 2022-05-28 MED ORDER — CEFAZOLIN SODIUM-DEXTROSE 2-4 GM/100ML-% IV SOLN
INTRAVENOUS | Status: AC
  Filled 2022-05-28: qty 100

## 2022-05-28 MED ORDER — FUROSEMIDE 20 MG PO TABS
20.0000 mg | ORAL_TABLET | Freq: Every day | ORAL | Status: DC
  Administered 2022-05-29 – 2022-05-30 (×3): 20 mg via ORAL
  Filled 2022-05-28 (×3): qty 1

## 2022-05-28 MED ORDER — FENTANYL-BUPIVACAINE-NACL 0.5-0.125-0.9 MG/250ML-% EP SOLN
EPIDURAL | Status: DC | PRN
  Administered 2022-05-28: 12 mL/h via EPIDURAL

## 2022-05-28 MED ORDER — FENTANYL CITRATE (PF) 100 MCG/2ML IJ SOLN
50.0000 ug | INTRAMUSCULAR | Status: DC | PRN
  Administered 2022-05-28: 100 ug via INTRAVENOUS

## 2022-05-28 MED ORDER — ACETAMINOPHEN 325 MG PO TABS
650.0000 mg | ORAL_TABLET | ORAL | Status: DC | PRN
  Administered 2022-05-28 – 2022-05-29 (×4): 650 mg via ORAL
  Filled 2022-05-28 (×4): qty 2

## 2022-05-28 MED ORDER — LACTATED RINGERS IV SOLN: INTRAVENOUS | Status: DC

## 2022-05-28 MED ORDER — VANCOMYCIN HCL IN DEXTROSE 1-5 GM/200ML-% IV SOLN
1000.0000 mg | Freq: Two times a day (BID) | INTRAVENOUS | Status: DC
  Administered 2022-05-28: 1000 mg via INTRAVENOUS
  Filled 2022-05-28: qty 200

## 2022-05-28 MED ORDER — TERBUTALINE SULFATE 1 MG/ML IJ SOLN
0.2500 mg | Freq: Once | INTRAMUSCULAR | Status: AC | PRN
  Administered 2022-05-28: 0.25 mg via SUBCUTANEOUS
  Filled 2022-05-28: qty 1

## 2022-05-28 MED ORDER — EPHEDRINE 5 MG/ML INJ: 10.0000 mg | INTRAVENOUS | Status: DC | PRN

## 2022-05-28 MED ORDER — PHENYLEPHRINE 80 MCG/ML (10ML) SYRINGE FOR IV PUSH (FOR BLOOD PRESSURE SUPPORT): 80.0000 ug | PREFILLED_SYRINGE | INTRAVENOUS | Status: DC | PRN

## 2022-05-28 MED ORDER — LIDOCAINE HCL (PF) 1 % IJ SOLN: 30.0000 mL | INTRAMUSCULAR | Status: DC | PRN

## 2022-05-28 MED ORDER — OXYCODONE-ACETAMINOPHEN 5-325 MG PO TABS: 2.0000 | ORAL_TABLET | ORAL | Status: DC | PRN

## 2022-05-28 MED ORDER — BENZOCAINE-MENTHOL 20-0.5 % EX AERO: 1.0000 | INHALATION_SPRAY | CUTANEOUS | Status: DC | PRN

## 2022-05-28 MED ORDER — SIMETHICONE 80 MG PO CHEW: 80.0000 mg | CHEWABLE_TABLET | ORAL | Status: DC | PRN

## 2022-05-28 MED ORDER — LACTATED RINGERS AMNIOINFUSION: INTRAVENOUS | Status: DC

## 2022-05-28 MED ORDER — LACTATED RINGERS IV BOLUS
1000.0000 mL | Freq: Once | INTRAVENOUS | Status: AC
  Administered 2022-05-28: 1000 mL via INTRAVENOUS

## 2022-05-28 MED ORDER — ONDANSETRON HCL 4 MG/2ML IJ SOLN: 4.0000 mg | INTRAMUSCULAR | Status: DC | PRN

## 2022-05-28 MED ORDER — OXYTOCIN-SODIUM CHLORIDE 30-0.9 UT/500ML-% IV SOLN: 2.5000 [IU]/h | INTRAVENOUS | Status: DC

## 2022-05-28 MED ORDER — FENTANYL CITRATE (PF) 100 MCG/2ML IJ SOLN
INTRAMUSCULAR | Status: AC
  Filled 2022-05-28: qty 2

## 2022-05-28 MED ORDER — OXYTOCIN-SODIUM CHLORIDE 30-0.9 UT/500ML-% IV SOLN
1.0000 m[IU]/min | INTRAVENOUS | Status: DC
  Filled 2022-05-28: qty 500

## 2022-05-28 MED ORDER — DIBUCAINE (PERIANAL) 1 % EX OINT: 1.0000 | TOPICAL_OINTMENT | CUTANEOUS | Status: DC | PRN

## 2022-05-28 MED ORDER — CEFAZOLIN SODIUM-DEXTROSE 2-4 GM/100ML-% IV SOLN
2.0000 g | Freq: Once | INTRAVENOUS | Status: AC
  Administered 2022-05-28: 2 g via INTRAVENOUS

## 2022-05-28 NOTE — H&P (Signed)
OBSTETRIC ADMISSION HISTORY AND PHYSICAL  Mallory Willis is a 26 y.o. female G2P1001 with IUP at [redacted]w[redacted]d by 12 wk [place] presenting for SOL. She reports +FMs, No LOF, no VB, no blurry vision, headaches or peripheral edema, and RUQ pain.  She plans on breast feeding. She requests nothing for birth control. She received her prenatal care at [facility]   Dating: By [place] --->  Estimated Date of Delivery: 06/08/22  Sono:   @[redacted]w[redacted]d , CWD, normal anatomy, cephalic presentation, 372g, 52% EFW   Prenatal History/Complications:  Patient Active Problem List   Diagnosis Date Noted   Normal labor 05/28/2022   Anemia in pregnancy 12/06/2021   History of gestational hypertension 12/05/2021   Encounter for supervision of normal pregnancy, antepartum 12/05/2021   History of cesarean section 12/05/2021   Chlamydia infection affecting pregnancy in second trimester 12/05/2021   Trichomonal vaginitis during pregnancy in second trimester 12/05/2021   Tobacco smoking affecting pregnancy 12/05/2021      FAMILY TREE  RESULTS  Language English Pap 12/05/21 NILM, +trich  Initiated care at 13wks GC/CT Initial: -/+, neg POC           36wks:  Dating by 12wks 12/07/21    Support person  Genetics NT/IT: too late    AFP:      Panorama: low risk female  BP cuff  Carrier Screen neg    [place]/Hgb Elec 08/14/16 neg  Rhogam n/a    TDaP vaccine 7/25 Blood Type O/Positive/-- (03/24 1011)  Flu vaccine  Antibody Negative (03/24 1011)  Covid vaccine  HBsAg Negative (03/24 1011)    RPR Non Reactive (03/24 1011)  Anatomy 02-23-1984 Nl girl 'Mallory Willis', LVEICF Rubella  1.74 (03/24 1011)  Feeding Plan breast HIV Non Reactive (03/24 1011)  Contraception Considering BTL Hep C neg  Circumcision n/a    Pediatrician [facility] A1C/GTT Early:      26-28wks: normal  Prenatal Classes discussed      GBS   POS  [ ]  PCN allergy  BTL Consent 04/07/22    VBAC Consent 04/07/22 PHQ9 & GAD7  [ ] New OB  [ ] 28wks   [ ] 36wks  Waterbirth [ ] Class [ ]  36wkCNM  visit/consent     Past Medical History: Past Medical History:  Diagnosis Date   Asthma    Chronic back pain    Gonorrhea    Nausea and vomiting during pregnancy 08/14/2016   Pregnancy induced hypertension    Supervision of normal pregnancy 08/14/2016    [facility] Initiated Care at   7+2 weeks FOB  Mallory Willis 26 yo bm 2nd Dating By  LMP and  Pap   NA GC/CT Initial:                36+wks: Genetic Screen NT/IT:  CF screen   Declined  Anatomic  Flu vaccine  Tdap Recommended ~ 28wks Glucose Screen  2 hr GBS  Feed Preference  Contraception  Circumcision  Childbirth Classes  Pediatrician      Past Surgical History: Past Surgical History:  Procedure Laterality Date   CESAREAN SECTION N/A 04/10/2017   Procedure: CESAREAN SECTION;  Surgeon: , MD;  Location: [facility];  Service: Obstetrics;  Laterality: N/A;    Obstetrical History: OB History     Gravida  2   Para  1   Term  1   Preterm      AB      Living  1  SAB      IAB      Ectopic      Multiple  0   Live Births  1           Social History Social History   Socioeconomic History   Marital status: Significant Other    Spouse name: Not on file   Number of children: Not on file   Years of education: Not on file   Highest education level: Not on file  Occupational History   Not on file  Tobacco Use   Smoking status: Every Day    Packs/day: 0.25    Years: 3.00    Total pack years: 0.75    Types: Cigarettes   Smokeless tobacco: Never  Vaping Use   Vaping Use: Never used  Substance and Sexual Activity   Alcohol use: No   Drug use: Yes    Types: Marijuana    Comment: a week ago   Sexual activity: Yes    Birth control/protection: None    Comment: last intercourse a week ago  Other Topics Concern   Not on file  Social History Narrative   Not on file   Social Determinants of Health   Financial Resource Strain: Medium Risk (03/24/2022)   Overall  Financial Resource Strain (CARDIA)    Difficulty of Paying Living Expenses: Somewhat hard  Food Insecurity: No Food Insecurity (03/24/2022)   Hunger Vital Sign    Worried About Running Out of Food in the Last Year: Never true    Ran Out of Food in the Last Year: Never true  Transportation Needs: Unmet Transportation Needs (03/24/2022)   PRAPARE - Transportation    Lack of Transportation (Medical): Yes    Lack of Transportation (Non-Medical): Yes  Physical Activity: Insufficiently Active (03/24/2022)   Exercise Vital Sign    Days of Exercise per Week: 4 days    Minutes of Exercise per Session: 10 min  Stress: No Stress Concern Present (03/24/2022)   [employer] of Occupational Health - Occupational Stress Questionnaire    Feeling of Stress : Not at all  Social Connections: Moderately Isolated (03/24/2022)   Social Connection and Isolation Panel [NHANES]    Frequency of Communication with Friends and Family: More than three times a week    Frequency of Social Gatherings with Friends and Family: More than three times a week    Attends Religious Services: 1 to 4 times per year    Active Member of [employer] or Organizations: No    Attends Banker Meetings: Never    Marital Status: Never married    Family History: Family History  Problem Relation Age of Onset   Other Mother        degenerative disc   Other Father        heart surgery   Cancer Maternal Grandfather     Allergies: Allergies  Allergen Reactions   Hydrocodone Hives   Amoxicillin Rash    Has patient had a PCN reaction causing immediate rash, facial/tongue/throat swelling, SOB or lightheadedness with hypotension: Yes Has patient had a PCN reaction causing severe rash involving mucus membranes or skin necrosis: No Has patient had a PCN reaction that required hospitalization: No Has patient had a PCN reaction occurring within the last 10 years: No If all of the above answers are "NO", then may proceed with  Cephalosporin use.   Ceclor [Cefaclor] Rash   Nsaids     Pt reports hx of ulcers   Red  Dye Rash   Ultram [Tramadol] Itching    Medications Prior to Admission  Medication Sig Dispense Refill Last Dose   aspirin 81 MG chewable tablet Chew 2 tablets (162 mg total) by mouth daily. 60 tablet 7 05/27/2022   ferrous fumarate (HEMOCYTE - 106 MG FE) 325 (106 Fe) MG TABS tablet Take 1 tablet (106 mg of iron total) by mouth every other day. 30 tablet 4 05/27/2022   Prenatal Vit-DSS-Fe Fum-FA (PRENATAL 19) 29-1 MG TABS Take 1 tablet by mouth daily. 100 tablet 11 05/27/2022   albuterol (VENTOLIN HFA) 108 (90 Base) MCG/ACT inhaler Inhale 1-2 puffs into the lungs every 6 (six) hours as needed for wheezing or shortness of breath. (Patient not taking: Reported on 02/20/2022)      Blood Pressure Monitor MISC For regular home bp monitoring during pregnancy 1 each 0    Doxylamine-Pyridoxine 10-10 MG TBEC Take 1 tablet by mouth every 8 (eight) hours as needed. (Patient not taking: Reported on 03/24/2022) 60 tablet 0    ondansetron (ZOFRAN) 4 MG tablet Take 1 tablet (4 mg total) by mouth every 6 (six) hours. (Patient not taking: Reported on 12/05/2021) 10 tablet 0    ondansetron (ZOFRAN-ODT) 4 MG disintegrating tablet Take 1 tablet (4 mg total) by mouth every 8 (eight) hours as needed for nausea or vomiting. (Patient not taking: Reported on 05/11/2022) 15 tablet 0    Prenatal Vit-Fe Fumarate-FA (PRENATAL 19) tablet Chew 1 tablet by mouth daily. (Patient not taking: Reported on 05/11/2022) 30 tablet 11      Review of Systems   All systems reviewed and negative except as stated in HPI  Blood pressure 135/82, pulse 91, temperature 98.1 F (36.7 C), temperature source Oral, resp. rate 18, height 5\' 9"  (1.753 m), weight 108.7 kg, last menstrual period 01/19/2021, SpO2 96 %. General appearance: alert and cooperative Lungs: clear to auscultation bilaterally Heart: regular rate and rhythm Abdomen: soft, non-tender; bowel  sounds normal Pelvic: SVE 4.5/50/-3 Extremities: Homans sign is negative, no sign of DVT Presentation: cephalic Fetal monitoringBaseline: 125 bpm, Variability: Good {> 6 bpm), Accelerations: Reactive, and Decelerations: Variable: mild Uterine activityFrequency: Every 4-5 minutes Dilation: 4.5 Effacement (%): 50 Station: -3 Exam by:: [email or web address], RN   Prenatal labs: ABO, Rh: --/--/PENDING (09/14 02-06-2005) Antibody: PENDING (09/14 0313) Rubella: 1.74 (03/24 1011) RPR: Non Reactive (07/11 0905)  HBsAg: Negative (03/24 1011)  HIV: Non Reactive (07/11 0905)  GBS: --10-29-1981 (08/28 1620)  2 hr Glucola nml Genetic screening  LR Anatomy 11-25-1972 LVEICF  Prenatal Transfer Tool  Maternal Diabetes: No Genetic Screening: Abnormal:  Results: Other: Maternal Ultrasounds/Referrals: Other:EIF Fetal Ultrasounds or other Referrals:  None Maternal Substance Abuse:  Yes:  Type: Smoker Significant Maternal Medications:  None Significant Maternal Lab Results:  Group B Strep positive Number of Prenatal Visits:greater than 3 verified prenatal visits Other Comments:  None  Results for orders placed or performed during the hospital encounter of 05/28/22 (from the past 24 hour(s))  Type and screen [facility]   Collection Time: 05/28/22  3:13 AM  Result Value Ref Range   ABO/RH(D) PENDING    Antibody Screen PENDING    Sample Expiration      05/31/2022,2359 Performed at [facility], [address], [place], [place]     Patient Active Problem List   Diagnosis Date Noted   Normal labor 05/28/2022   Anemia in pregnancy 12/06/2021   History of gestational hypertension 12/05/2021   Encounter for supervision  of normal pregnancy, antepartum 12/05/2021   History of cesarean section 12/05/2021   Chlamydia infection affecting pregnancy in second trimester 12/05/2021   Trichomonal vaginitis during pregnancy in second trimester 12/05/2021   Tobacco smoking affecting  pregnancy 12/05/2021    Assessment/Plan:  Kaysee Hergert is a 26 y.o. G2P1001 at [redacted]w[redacted]d here for SOL  #Labor:TOLAC, pit 1x1 max 1mu #Pain: Epidural upon req, IV pain meds #FWB: CAT 1 #ID:  GBS pos, PCN allergy, clinda resistant and higher risk for cephalosporin reaction-- start Vanc ppx #MOF: breast #MOC: BTL, signed consent on 04/07/22. However, now declines BTL- doesn't want contraception #tobacco use: 2 cigs per day  #chlamydia/trich infection: 04/27/22- treated, Swab collected here for TOC # anemia: on ferrous sulfate, CBC collected #h/o gHTN: no meds, baseline labs neg. Elevated BP here. Labs collected  Myrtie Hawk, DO  05/28/2022, 3:58 AM

## 2022-05-28 NOTE — MAU Note (Signed)
.  Mallory Willis is a 26 y.o. at [redacted]w[redacted]d here in MAU reporting: ctx for the last 3 hours, q71mins. Pt reports bloody show with mucus earlier today. Pt denies VB, LOF,  DFM, PIH s/s, and complications in the pregnancy. Recent intercourse  Denis HSV GBS pos SVE 2 weeks ago 0 closed/thin Onset of complaint: 2230 Pain score: 8/10 ctx Vitals:   05/28/22 0340 05/28/22 0401  BP:  (!) 145/94  Pulse:  95  Resp:  15  Temp:    SpO2: 100%      FHT:115  Lab orders placed from triage:

## 2022-05-28 NOTE — Progress Notes (Signed)
Labor Progress Note Mallory Willis is a 26 y.o. G2P1001 at [redacted]w[redacted]d presented for SOL  S: called to bedside for variables despite IVB and maternal change in positions.   O:  BP 137/67   Pulse 86   Temp 97.9 F (36.6 C) (Oral)   Resp 15   Ht 5\' 9"  (1.753 m)   Wt 108.7 kg   LMP 01/19/2021 (Approximate)   SpO2 100%   BMI 35.40 kg/m  EFM: 120bpm/Moderate variability/ 15x15 accels/ Variable decels   CVE: Dilation: 6 Effacement (%): 70 Cervical Position: Posterior Station: 0 Presentation: Vertex Exam by:: Dr. [email or web address]   A&P: 26 y.o. G2P1001 [redacted]w[redacted]d SOL   #Labor: Progressing well. IUPC placed and amnioinfusion started. Pt still had variable decelerations, so gave terbutaline x1 #Pain: Epidural #FWB: CAT 2 #GBS positive, Vanc   Mallory Willis Q Mercado-Ortiz, DO 7:14 AM

## 2022-05-28 NOTE — Anesthesia Preprocedure Evaluation (Addendum)
Anesthesia Evaluation  Patient identified by MRN, date of birth, ID band Patient awake    Reviewed: Allergy & Precautions, NPO status , Patient's Chart, lab work & pertinent test results  Airway Mallampati: II  TM Distance: >3 FB Neck ROM: Full    Dental no notable dental hx. (+) Teeth Intact   Pulmonary Current Smoker,    Pulmonary exam normal breath sounds clear to auscultation       Cardiovascular hypertension (gHtn), Normal cardiovascular exam Rhythm:Regular Rate:Normal     Neuro/Psych negative neurological ROS  negative psych ROS   GI/Hepatic negative GI ROS, Neg liver ROS,   Endo/Other  negative endocrine ROS  Renal/GU negative Renal ROS     Musculoskeletal   Abdominal (+) + obese (BMI 35.6),   Peds  Hematology Lab Results      Component                Value               Date                          HGB                      10.6 (L)            05/28/2022                HCT                      33.3 (L)            05/28/2022                     PLT                      284                 05/28/2022              Anesthesia Other Findings ALL A,oxicillin, ultram, Nsaids  Reproductive/Obstetrics (+) Pregnancy                            Anesthesia Physical Anesthesia Plan  ASA: 3  Anesthesia Plan: Epidural   Post-op Pain Management:    Induction:   PONV Risk Score and Plan:   Airway Management Planned:   Additional Equipment: None  Intra-op Plan:   Post-operative Plan:   Informed Consent:     Dental advisory given  Plan Discussed with:   Anesthesia Plan Comments: (38.3 wk G2P1)        Anesthesia Quick Evaluation

## 2022-05-28 NOTE — Discharge Summary (Signed)
Postpartum Discharge Summary  Date of Service updated***     Patient Name: Mallory Willis DOB: 01/16/1996 MRN: [id number]  Date of admission: 05/28/2022 Delivery date:05/28/2022  Delivering provider: Christin Fudge  Date of discharge: 05/28/2022  Admitting diagnosis: Normal labor [O80, Z37.9] Intrauterine pregnancy: [redacted]w[redacted]d    Secondary diagnosis:  Principal Problem:   Normal labor Active Problems:   History of gestational hypertension   Encounter for supervision of normal pregnancy, antepartum   History of cesarean section   Chlamydia infection affecting pregnancy in second trimester   Trichomonal vaginitis during pregnancy in second trimester   Tobacco smoking affecting pregnancy   Anemia in pregnancy   Shoulder (girdle) dystocia during labor and delivery  Additional problems: None     Discharge diagnosis: Term Pregnancy Delivered, VBAC, and Gestational Hypertension                                              Post partum procedures:{Postpartum procedures:23558} Augmentation: AROM and Pitocin Complications: None  Hospital course: Onset of Labor With Vaginal Delivery      26y.o. yo G2P1001 at 390w3das admitted in Latent Labor on 05/28/2022. Patient had an uncomplicated labor course as follows:  Membrane Rupture Time/Date: 5:52 AM ,05/28/2022   Delivery Method:VBAC, Spontaneous  Episiotomy: None  Lacerations:  None  Patient had an uncomplicated postpartum course.  She is ambulating, tolerating a regular diet, passing flatus, and urinating well. Patient is discharged home in stable condition on 05/28/22.  Newborn Data: Birth date:05/28/2022  Birth time:3:50 PM  Gender:Female  Living status:Living  Apgars:8 ,9  Weight:   Magnesium Sulfate received: No BMZ received: {BMZ received:30440023} Rhophylac:{Rhophylac received:30440032} MMKHT:{XHF:41423953}-DaP:{Tdap:23962} Flu: {F{UYE:33435}ransfusion:{Transfusion received:30440034}  Physical exam  Vitals:    05/28/22 1511 05/28/22 1515 05/28/22 1516 05/28/22 1629  BP: (!) 146/86  (!) 144/85 (!) 142/73  Pulse: 84  86 (!) 102  Resp: 17  17   Temp:      TempSrc:      SpO2:  100%    Weight:      Height:       General: {Exam; general:21111117} Lochia: {Desc; appropriate/inappropriate:30686::"appropriate"} Uterine Fundus: {Desc; firm/soft:30687} Incision: {Exam; incision:21111123} DVT Evaluation: {Exam; dvt:2111122} Labs: Lab Results  Component Value Date   WBC 12.0 (H) 05/28/2022   HGB 10.6 (L) 05/28/2022   HCT 33.3 (L) 05/28/2022   MCV 82.0 05/28/2022   PLT 284 05/28/2022      Latest Ref Rng & Units 05/28/2022    6:55 AM  CMP  Glucose 70 - 99 mg/dL 83   BUN 6 - 20 mg/dL 5   Creatinine 0.44 - 1.00 mg/dL 0.58   Sodium 135 - 145 mmol/L 137   Potassium 3.5 - 5.1 mmol/L 3.9   Chloride 98 - 111 mmol/L 106   CO2 22 - 32 mmol/L 23   Calcium 8.9 - 10.3 mg/dL 9.1   Total Protein 6.5 - 8.1 g/dL 6.0   Total Bilirubin 0.3 - 1.2 mg/dL 0.3   Alkaline Phos 38 - 126 U/L 130   AST 15 - 41 U/L 13   ALT 0 - 44 U/L <5    Edinburgh Score:    05/11/2017   11:12 AM  Edinburgh Postnatal Depression Scale Screening Tool  I have been able to laugh and see the funny side of things. 0  I have  looked forward with enjoyment to things. 0  I have blamed myself unnecessarily when things went wrong. 0  I have been anxious or worried for no good reason. 0  I have felt scared or panicky for no good reason. 0  Things have been getting on top of me. 0  I have been so unhappy that I have had difficulty sleeping. 0  I have felt sad or miserable. 0  I have been so unhappy that I have been crying. 0  The thought of harming myself has occurred to me. 0  Edinburgh Postnatal Depression Scale Total 0     After visit meds:  Allergies as of 05/28/2022       Reactions   Hydrocodone Hives   Amoxicillin Rash   Has patient had a PCN reaction causing immediate rash, facial/tongue/throat swelling, SOB or  lightheadedness with hypotension: Yes Has patient had a PCN reaction causing severe rash involving mucus membranes or skin necrosis: No Has patient had a PCN reaction that required hospitalization: No Has patient had a PCN reaction occurring within the last 10 years: No If all of the above answers are "NO", then may proceed with Cephalosporin use.   Ceclor [cefaclor] Rash   Nsaids    Pt reports hx of ulcers   Red Dye Rash   Ultram [tramadol] Itching     Med Rec must be completed prior to using this Foss***        Discharge home in stable condition Infant Feeding: {Baby feeding:23562} Infant Disposition:{CHL IP OB HOME WITH IWPYKD:98338} Discharge instruction: per After Visit Summary and Postpartum booklet. Activity: Advance as tolerated. Pelvic rest for 6 weeks.  Diet: {OB SNKN:39767341} Future Appointments:No future appointments. Follow up Visit:   Please schedule this patient for a {Visit type:23955} postpartum visit in {Postpartum visit:23953} with the following provider: {Provider type:23954}. Additional Postpartum F/U:{PP Procedure:23957}  {Risk PFXTK:24097} pregnancy complicated by: {DZHGDJMEQAST:41962} Delivery mode:  VBAC, Spontaneous  Anticipated Birth Control:  {Birth Control:23956}   05/28/2022 Concepcion Living, MD

## 2022-05-28 NOTE — Anesthesia Procedure Notes (Signed)
Epidural Patient location during procedure: OB Start time: 05/28/2022 4:38 AM End time: 05/28/2022 4:55 AM  Staffing Anesthesiologist: Trevor Iha, MD Performed: anesthesiologist   Preanesthetic Checklist Completed: patient identified, IV checked, site marked, risks and benefits discussed, surgical consent, monitors and equipment checked, pre-op evaluation and timeout performed  Epidural Patient position: sitting Prep: DuraPrep and site prepped and draped Patient monitoring: continuous pulse ox and blood pressure Approach: midline Location: L3-L4 Injection technique: LOR air  Needle:  Needle type: Tuohy  Needle gauge: 17 G Needle length: 9 cm and 9 Needle insertion depth: 7 cm Catheter type: closed end flexible Catheter size: 19 Gauge Catheter at skin depth: 14 cm Test dose: negative  Assessment Events: blood not aspirated, injection not painful, no injection resistance, no paresthesia and negative IV test  Additional Notes Patient identified. Risks/Benefits/Options discussed with patient including but not limited to bleeding, infection, nerve damage, paralysis, failed block, incomplete pain control, headache, blood pressure changes, nausea, vomiting, reactions to medication both or allergic, itching and postpartum back pain. Confirmed with bedside nurse the patient's most recent platelet count. Confirmed with patient that they are not currently taking any anticoagulation, have any bleeding history or any family history of bleeding disorders. Patient expressed understanding and wished to proceed. All questions were answered. Sterile technique was used throughout the entire procedure. Please see nursing notes for vital signs. Test dose was given through epidural needle and negative prior to continuing to dose epidural or start infusion. Warning signs of high block given to the patient including shortness of breath, tingling/numbness in hands, complete motor block, or any  concerning symptoms with instructions to call for help. Patient was given instructions on fall risk and not to get out of bed. All questions and concerns addressed with instructions to call with any issues.  1 Attempt (S) . Patient tolerated procedure well.

## 2022-05-29 ENCOUNTER — Encounter (HOSPITAL_COMMUNITY): Payer: Self-pay | Admitting: Obstetrics and Gynecology

## 2022-05-29 ENCOUNTER — Other Ambulatory Visit (HOSPITAL_COMMUNITY): Payer: Self-pay

## 2022-05-29 MED ORDER — IBUPROFEN 600 MG PO TABS
600.0000 mg | ORAL_TABLET | Freq: Four times a day (QID) | ORAL | 0 refills | Status: DC | PRN
  Filled 2022-05-29: qty 30, 8d supply, fill #0

## 2022-05-29 MED ORDER — FUROSEMIDE 20 MG PO TABS
20.0000 mg | ORAL_TABLET | Freq: Every day | ORAL | 0 refills | Status: DC
  Filled 2022-05-29: qty 3, 3d supply, fill #0

## 2022-05-29 MED ORDER — NIFEDIPINE ER 30 MG PO TB24
30.0000 mg | ORAL_TABLET | Freq: Every day | ORAL | 0 refills | Status: DC
  Filled 2022-05-29: qty 30, 30d supply, fill #0

## 2022-05-29 MED ORDER — ACETAMINOPHEN 325 MG PO TABS
650.0000 mg | ORAL_TABLET | ORAL | 0 refills | Status: DC | PRN
  Filled 2022-05-29: qty 30, 3d supply, fill #0

## 2022-05-29 MED ORDER — OXYCODONE HCL 5 MG PO TABS
5.0000 mg | ORAL_TABLET | Freq: Once | ORAL | Status: AC
  Administered 2022-05-29: 5 mg via ORAL
  Filled 2022-05-29: qty 1

## 2022-05-29 MED ORDER — IBUPROFEN 600 MG PO TABS
600.0000 mg | ORAL_TABLET | Freq: Four times a day (QID) | ORAL | Status: DC | PRN
  Administered 2022-05-29 – 2022-05-30 (×5): 600 mg via ORAL
  Filled 2022-05-29 (×5): qty 1

## 2022-05-29 MED ORDER — ACETAMINOPHEN 500 MG PO TABS
1000.0000 mg | ORAL_TABLET | Freq: Four times a day (QID) | ORAL | Status: DC | PRN
  Administered 2022-05-29 – 2022-05-30 (×3): 1000 mg via ORAL
  Filled 2022-05-29 (×4): qty 2

## 2022-05-29 MED ORDER — NIFEDIPINE ER OSMOTIC RELEASE 30 MG PO TB24
30.0000 mg | ORAL_TABLET | Freq: Every day | ORAL | Status: DC
  Administered 2022-05-29 – 2022-05-30 (×2): 30 mg via ORAL
  Filled 2022-05-29 (×2): qty 1

## 2022-05-29 NOTE — Anesthesia Postprocedure Evaluation (Signed)
Anesthesia Post Note  Patient: Mallory Willis  Procedure(s) Performed: AN AD HOC LABOR EPIDURAL     Patient location during evaluation: Mother Baby Anesthesia Type: Epidural Level of consciousness: awake and alert and oriented Pain management: satisfactory to patient Vital Signs Assessment: post-procedure vital signs reviewed and stable Respiratory status: respiratory function stable Cardiovascular status: stable Postop Assessment: no headache, no backache, epidural receding, patient able to bend at knees, no signs of nausea or vomiting, adequate PO intake and able to ambulate Anesthetic complications: no   No notable events documented.  Last Vitals:  Vitals:   05/29/22 0400 05/29/22 0830  BP: 120/62 131/88  Pulse: 61 72  Resp: 18 16  Temp: 36.7 C 36.9 C  SpO2:  100%    Last Pain:  Vitals:   05/29/22 0830  TempSrc: Oral  PainSc: 7    Pain Goal: Patients Stated Pain Goal: 0 (05/28/22 0355)                 Karleen Dolphin

## 2022-05-29 NOTE — Lactation Note (Signed)
This note was copied from a baby's chart. Lactation Consultation Note  Patient Name: Mallory Willis [id number] Date: 05/29/2022   Age:25 hours Per RN E[place] Noe), Birth Parent declined [facility] services tonight.  Maternal Data    Feeding    LATCH Score                    Lactation Tools Discussed/Used    Interventions    Discharge    Consult Status      Danelle Earthly 05/29/2022, 12:36 AM

## 2022-05-29 NOTE — Lactation Note (Signed)
This note was copied from a baby's chart. Lactation Consultation Note  Patient Name: Mallory Willis Date: 05/29/2022   Age:25 hours Per RN, Birth Parent declined LC services on [place].  Maternal Data    Feeding Nipple Type: Slow - flow  LATCH Score                    Lactation Tools Discussed/Used    Interventions    Discharge    Consult Status      Danelle Earthly 05/29/2022, 7:05 PM

## 2022-05-30 DIAGNOSIS — Z3481 Encounter for supervision of other normal pregnancy, first trimester: Secondary | ICD-10-CM | POA: Diagnosis not present

## 2022-05-30 NOTE — Discharge Summary (Signed)
Postpartum Discharge Summary     Patient Name: Mallory Willis DOB: [birth year] MRN: [id number]  Date of admission: 05/28/2022 Delivery date:05/28/2022  Delivering provider: Christin Fudge  Date of discharge: 05/30/2022  Admitting diagnosis: Normal labor [O80, Z37.9] Intrauterine pregnancy: [redacted]w[redacted]d    Secondary diagnosis:  Principal Problem:   Normal labor Active Problems:   History of gestational hypertension   Encounter for supervision of normal pregnancy, antepartum   History of cesarean section   Chlamydia infection affecting pregnancy in second trimester   Trichomonal vaginitis during pregnancy in second trimester   Tobacco smoking affecting pregnancy   Anemia in pregnancy   Shoulder (girdle) dystocia during labor and delivery   VBAC, delivered  Additional problems: None     Discharge diagnosis: Term Pregnancy Delivered, VBAC, and Gestational Hypertension                                              Post partum procedures: n/a Augmentation: AROM and Pitocin Complications: None  Hospital course: Onset of Labor With Vaginal Delivery      26y.o. yo G2P1001 at 364w3das admitted in Latent Labor on 05/28/2022. Patient had an uncomplicated labor course as follows:  Membrane Rupture Time/Date: 5:52 AM ,05/28/2022   Delivery Method:VBAC, Spontaneous  Episiotomy: None  Lacerations:  None  Patient had a postpartum course complicated by elevated BPs. She was started on procardia XL 30 mg daily in addition to lasix.  She is ambulating, tolerating a regular diet, passing flatus, and urinating well. Patient is discharged home in stable condition on 05/30/22.  Newborn Data: Birth date:05/28/2022  Birth time:3:50 PM  Gender:Female  Living status:Living  Apgars:8 ,9  Weight:3030 g   Magnesium Sulfate received: No BMZ received: No Rhophylac:No MMR:No T-DaP:Given prenatally Flu: N/A Transfusion:No  Physical exam  Vitals:   05/29/22 0830 05/29/22 1538 05/29/22 2100  05/30/22 0421  BP: 131/88 127/79 126/84 133/80  Pulse: 72 79 68 (P) 73  Resp: '16 16 18 ' (P) 18  Temp: 98.4 F (36.9 C) 98.2 F (36.8 C) 98.4 F (36.9 C) 97.9 F (36.6 C)  TempSrc: Oral Oral Oral Oral  SpO2: 100% 100%    Weight:      Height:       General: alert, cooperative, and no distress Lochia: appropriate Uterine Fundus: firm Incision: N/A DVT Evaluation: No evidence of DVT seen on physical exam. Labs: Lab Results  Component Value Date   WBC 12.0 (H) 05/28/2022   HGB 10.6 (L) 05/28/2022   HCT 33.3 (L) 05/28/2022   MCV 82.0 05/28/2022   PLT 284 05/28/2022      Latest Ref Rng & Units 05/28/2022    6:55 AM  CMP  Glucose 70 - 99 mg/dL 83   BUN 6 - 20 mg/dL 5   Creatinine 0.44 - 1.00 mg/dL 0.58   Sodium 135 - 145 mmol/L 137   Potassium 3.5 - 5.1 mmol/L 3.9   Chloride 98 - 111 mmol/L 106   CO2 22 - 32 mmol/L 23   Calcium 8.9 - 10.3 mg/dL 9.1   Total Protein 6.5 - 8.1 g/dL 6.0   Total Bilirubin 0.3 - 1.2 mg/dL 0.3   Alkaline Phos 38 - 126 U/L 130   AST 15 - 41 U/L 13   ALT 0 - 44 U/L <5    Edinburgh Score:    05/29/2022  6:09 PM  Edinburgh Postnatal Depression Scale Screening Tool  I have been able to laugh and see the funny side of things. 0  I have looked forward with enjoyment to things. 0  I have blamed myself unnecessarily when things went wrong. 0  I have been anxious or worried for no good reason. 0  I have felt scared or panicky for no good reason. 0  Things have been getting on top of me. 0  I have been so unhappy that I have had difficulty sleeping. 0  I have felt sad or miserable. 0  I have been so unhappy that I have been crying. 0  The thought of harming myself has occurred to me. 0  Edinburgh Postnatal Depression Scale Total 0     After visit meds:  Allergies as of 05/30/2022       Reactions   Hydrocodone Hives   Amoxicillin Rash   Has patient had a PCN reaction causing immediate rash, facial/tongue/throat swelling, SOB or  lightheadedness with hypotension: Yes Has patient had a PCN reaction causing severe rash involving mucus membranes or skin necrosis: No Has patient had a PCN reaction that required hospitalization: No Has patient had a PCN reaction occurring within the last 10 years: No If all of the above answers are "NO", then may proceed with Cephalosporin use.   Ceclor [cefaclor] Rash   Nsaids    Pt reports hx of ulcers   Red Dye Rash   Ultram [tramadol] Itching        Medication List     STOP taking these medications    aspirin 81 MG chewable tablet   Doxylamine-Pyridoxine 10-10 MG Tbec   ondansetron 4 MG disintegrating tablet Commonly known as: ZOFRAN-ODT   ondansetron 4 MG tablet Commonly known as: ZOFRAN   Prenatal 19 tablet       TAKE these medications    acetaminophen 325 MG tablet Commonly known as: Tylenol Take 2 tablets (650 mg total) by mouth every 4 (four) hours as needed (for pain scale < 4).   albuterol 108 (90 Base) MCG/ACT inhaler Commonly known as: VENTOLIN HFA Inhale 1-2 puffs into the lungs every 6 (six) hours as needed for wheezing or shortness of breath.   Blood Pressure Monitor Misc For regular home bp monitoring during pregnancy   ferrous fumarate 325 (106 Fe) MG Tabs tablet Commonly known as: HEMOCYTE - 106 mg FE Take 1 tablet (106 mg of iron total) by mouth every other day.   furosemide 20 MG tablet Commonly known as: LASIX Take 1 tablet (20 mg total) by mouth daily for 3 days.   ibuprofen 600 MG tablet Commonly known as: ADVIL Take 1 tablet (600 mg total) by mouth every 6 (six) hours as needed for cramping.   NIFEdipine 30 MG 24 hr tablet Commonly known as: ADALAT CC Take 1 tablet (30 mg total) by mouth daily.   Prenatal 19 29-1 MG Tabs Take 1 tablet by mouth daily.        Discharge home in stable condition Infant Feeding: Bottle and Breast Infant Disposition:home with mother Discharge instruction: per After Visit Summary and  Postpartum booklet. Activity: Advance as tolerated. Pelvic rest for 6 weeks.  Diet: routine diet Future Appointments: Future Appointments  Date Time Provider [place]  06/05/2022 11:50 AM [facility] NURSE [facility]  06/29/2022  2:10 PM Cresenzo-Dishmon, Joaquim Lai, CNM [facility]   Follow up Visit:  Message sent to [facility] by [place]-Lott on 05/30/2022  Please schedule this  patient for a In person postpartum visit in 4 weeks with the following provider: MD and APP. Additional Postpartum F/U:BP check 1 week  High risk pregnancy complicated by: HTN Delivery mode:  VBAC, Spontaneous  Anticipated Birth Control:   Declined   05/30/2022 Shelda Pal, DO

## 2022-05-30 NOTE — Plan of Care (Signed)
Discharge instructions given with after visit summary and Baby Scripts instructions with blood pressure cuff as ordered. Pt is receptive.

## 2022-05-31 ENCOUNTER — Encounter: Payer: Self-pay | Admitting: Family Medicine

## 2022-06-01 LAB — SURGICAL PATHOLOGY

## 2022-06-03 ENCOUNTER — Telehealth: Payer: Self-pay | Admitting: *Deleted

## 2022-06-03 NOTE — Telephone Encounter (Signed)
Mychart message sent regarding BP. Mallory Willis 

## 2022-06-04 ENCOUNTER — Encounter: Payer: Medicaid Other | Admitting: Advanced Practice Midwife

## 2022-06-06 ENCOUNTER — Telehealth (HOSPITAL_COMMUNITY): Payer: Self-pay | Admitting: *Deleted

## 2022-06-06 NOTE — Telephone Encounter (Signed)
Chart opened in error.  Odis Hollingshead, RN 06-06-2022 at 3:15pm

## 2022-06-08 ENCOUNTER — Telehealth (HOSPITAL_COMMUNITY): Payer: Self-pay | Admitting: *Deleted

## 2022-06-08 ENCOUNTER — Encounter: Payer: Self-pay | Admitting: *Deleted

## 2022-06-08 NOTE — Telephone Encounter (Signed)
Hospital Discharge Follow-Up Call:  Patient reports that her blood pressure has been high and that she has an appointment at [facility] office tomorrow to discuss medications.  She is currently taking nifedipine and she has a persistent headache unrelieved by Tylenol.  She thinks the headache is likely a side effect of the medication.  Denies blurred vision, scotoma, RUQ pain, or swelling in extremities.  Encouraged her to keep her appointment tomorrow and to come to [facility]U if she develops any of the other symptoms mentioned above.   She has no other concerns regarding her healing process from giving birth.  EPDS today was 2 and she endorses this accurately reflects that she is doing well emotionally.  She says that baby is well and she has no concerns about baby's health.  She reports that baby sleeps in a crib in patient's bedroom.  Reviewed ABCs of Safe Sleep.

## 2022-06-09 ENCOUNTER — Ambulatory Visit (INDEPENDENT_AMBULATORY_CARE_PROVIDER_SITE_OTHER): Payer: Medicaid Other | Admitting: *Deleted

## 2022-06-09 VITALS — BP 131/90 | HR 90

## 2022-06-09 DIAGNOSIS — Z8759 Personal history of other complications of pregnancy, childbirth and the puerperium: Secondary | ICD-10-CM

## 2022-06-09 DIAGNOSIS — O165 Unspecified maternal hypertension, complicating the puerperium: Secondary | ICD-10-CM | POA: Insufficient documentation

## 2022-06-09 MED ORDER — AMLODIPINE BESYLATE 5 MG PO TABS: 5.0000 mg | ORAL_TABLET | Freq: Every day | ORAL | 3 refills | Status: DC

## 2022-06-09 NOTE — Progress Notes (Addendum)
   NURSE VISIT- BLOOD PRESSURE CHECK  SUBJECTIVE:  Mallory Willis is a 26 y.o. [phone] female here for BP check. She is postpartum, delivery date 05/28/22     HYPERTENSION ROS:  Postpartum:  Severe headaches that don't go away with tylenol/other medicines: only after taking Procardia Visual changes (seeing spots/double/blurred vision) No  Severe pain under right breast breast or in center of upper chest No  Severe nausea/vomiting No  Taking medicines as instructed no. Stopped taking Procardia on Friday since it was causing her headaches and started taking her Aunt's HCTZ 25mg .     OBJECTIVE:  BP (!) 131/90 (BP Location: Right Arm, Patient Position: Sitting, Willis Size: Normal)   Pulse 90   Breastfeeding No   Appearance alert, well appearing, and in no distress and oriented to person, place, and time.  ASSESSMENT: Postpartum  blood pressure check  PLAN: Discussed with Knute Neu, CNM, [facility]   Recommendations: New medication will be sent in.  Advised to stop taking Aunt's HCTZ Follow-up:  return Friday for BP check with nurse    Alice Rieger  06/09/2022 11:51 AM  Chart reviewed for nurse visit. Agree with plan of care. Rx norvasc 5mg  Roma Schanz, [place] 06/09/2022 12:30 PM

## 2022-06-09 NOTE — Addendum Note (Signed)
Addended by: Roma Schanz on: 06/09/2022 12:31 PM   Modules accepted: Orders

## 2022-06-14 ENCOUNTER — Encounter: Payer: Self-pay | Admitting: Obstetrics and Gynecology

## 2022-06-19 ENCOUNTER — Ambulatory Visit: Payer: Medicaid Other | Admitting: Family

## 2022-06-29 ENCOUNTER — Ambulatory Visit: Payer: Medicaid Other | Admitting: Advanced Practice Midwife

## 2022-09-14 NOTE — L&D Delivery Note (Signed)
LABOR COURSE Patient presented to MAU at 40 weeks 1 day, for contractions. FHR tracing incidentally revealed variables, decision made to induce labor for favorable cervix at term.   Delivery Note Called to room and patient was complete and pushing. Head delivered easily. Loose nuchal cord present, somersaulted out. Shoulder and body delivered in usual fashion. At 1036 a viable female was delivered via Vaginal, Spontaneous OA to LOA.  Infant with spontaneous cry, placed on mother's abdomen, dried and stimulated. Cord clamped x 2 after 3 minute-minute delay, and cut by maternal grandmother. Cord blood drawn. Placenta delivered spontaneously with gentle cord traction. Appears intact. Fundus firm with massage and Pitocin. Labia, perineum, vagina, and cervix inspected with sterile gauze to reveal bilateral periurethral lacerations, approximate well at rest and hemostatic.    APGAR: 8, 9 ; weight  pending.   Cord: 3VC with the following complications: none.   Cord pH: not indicated  Anesthesia: epidural  Episiotomy: None Lacerations: bilateral periurethral  Suture Repair:  none Est. Blood Loss (mL): 48  Mom to postpartum.  Baby to Couplet care / Skin to Skin.  Richardson Landry, CNM @TODAY @ 10:55 AM

## 2022-11-21 ENCOUNTER — Emergency Department (HOSPITAL_COMMUNITY)
Admission: EM | Admit: 2022-11-21 | Discharge: 2022-11-21 | Disposition: A | Discharge status: Home / Self Care | Payer: Medicaid Other | Attending: Emergency Medicine | Admitting: Emergency Medicine

## 2022-11-21 ENCOUNTER — Encounter (HOSPITAL_COMMUNITY): Payer: Self-pay

## 2022-11-21 ENCOUNTER — Other Ambulatory Visit: Payer: Self-pay

## 2022-11-21 DIAGNOSIS — J45909 Unspecified asthma, uncomplicated: Secondary | ICD-10-CM | POA: Insufficient documentation

## 2022-11-21 DIAGNOSIS — X58XXXA Exposure to other specified factors, initial encounter: Secondary | ICD-10-CM | POA: Insufficient documentation

## 2022-11-21 DIAGNOSIS — S0502XA Injury of conjunctiva and corneal abrasion without foreign body, left eye, initial encounter: Secondary | ICD-10-CM | POA: Insufficient documentation

## 2022-11-21 DIAGNOSIS — F17210 Nicotine dependence, cigarettes, uncomplicated: Secondary | ICD-10-CM | POA: Diagnosis not present

## 2022-11-21 MED ORDER — ERYTHROMYCIN 5 MG/GM OP OINT: 1.0000 | TOPICAL_OINTMENT | Freq: Four times a day (QID) | OPHTHALMIC | 0 refills | Status: AC

## 2022-11-21 MED ORDER — FLUORESCEIN SODIUM 1 MG OP STRP
1.0000 | ORAL_STRIP | Freq: Once | OPHTHALMIC | Status: AC
  Administered 2022-11-21: 1 via OPHTHALMIC
  Filled 2022-11-21: qty 1

## 2022-11-21 MED ORDER — TETRACAINE HCL 0.5 % OP SOLN
2.0000 [drp] | Freq: Once | OPHTHALMIC | Status: AC
  Administered 2022-11-21: 2 [drp] via OPHTHALMIC
  Filled 2022-11-21: qty 4

## 2022-11-21 NOTE — ED Provider Notes (Signed)
[facility] Emergency Department Provider Note MRN:  [id number]  Arrival date & time: 11/21/22     Chief Complaint   Eye Pain   History of Present Illness   Mallory Willis is a 27 y.o. year-old female with no pertinent past medical history presenting to the ED with chief complaint of leg pain.  Foreign body sensation to the left eye.  Went to bed with the eyes feeling normal, woke up with this sensation.  Not sure if she scratched it while in her sleep.  No vision loss, no other complaints.  Review of Systems  A thorough review of systems was obtained and all systems are negative except as noted in the HPI and PMH.   Patient's Health History    Past Medical History:  Diagnosis Date   Asthma    Chronic back pain    Gonorrhea    Nausea and vomiting during pregnancy 08/14/2016   Pregnancy induced hypertension    Supervision of normal pregnancy 08/14/2016    [facility] Initiated Care at   7+2 weeks FOB  malick mcfarland 27 yo bm 2nd Dating By  LMP and [place]  Pap   NA GC/CT Initial:                36+wks: Genetic Screen NT/IT:  CF screen   Declined  Anatomic [place]  Flu vaccine  Tdap Recommended ~ 28wks Glucose Screen  2 hr GBS  Feed Preference  Contraception  Circumcision  Childbirth Classes  Pediatrician      Past Surgical History:  Procedure Laterality Date   CESAREAN SECTION N/A 04/10/2017   Procedure: CESAREAN SECTION;  Surgeon: Jonnie Kind, MD;  Location: [place];  Service: Obstetrics;  Laterality: N/A;    Family History  Problem Relation Age of Onset   Other Mother        degenerative disc   Other Father        heart surgery   Cancer Maternal Grandfather     Social History   Socioeconomic History   Marital status: Significant Other    Spouse name: Not on file   Number of children: Not on file   Years of education: Not on file   Highest education level: Not on file  Occupational History   Not on file  Tobacco Use   Smoking  status: Every Day    Packs/day: 0.25    Years: 3.00    Total pack years: 0.75    Types: Cigarettes   Smokeless tobacco: Never  Vaping Use   Vaping Use: Never used  Substance and Sexual Activity   Alcohol use: No   Drug use: Yes    Types: Marijuana    Comment: a week ago   Sexual activity: Yes    Birth control/protection: None    Comment: last intercourse a week ago  Other Topics Concern   Not on file  Social History Narrative   Not on file   Social Determinants of Health   Financial Resource Strain: Medium Risk (03/24/2022)   Overall Financial Resource Strain (CARDIA)    Difficulty of Paying Living Expenses: Somewhat hard  Food Insecurity: No Food Insecurity (05/28/2022)   Hunger Vital Sign    Worried About Running Out of Food in the Last Year: Never true    Ran Out of Food in the Last Year: Never true  Transportation Needs: No Transportation Needs (05/28/2022)   PRAPARE - Hydrologist (Medical):  No    Lack of Transportation (Non-Medical): No  Recent Concern: Transportation Needs - Unmet Transportation Needs (03/24/2022)   PRAPARE - Transportation    Lack of Transportation (Medical): Yes    Lack of Transportation (Non-Medical): Yes  Physical Activity: Insufficiently Active (03/24/2022)   Exercise Vital Sign    Days of Exercise per Week: 4 days    Minutes of Exercise per Session: 10 min  Stress: No Stress Concern Present (03/24/2022)   [place]    Feeling of Stress : Not at all  Social Connections: Moderately Isolated (03/24/2022)   Social Connection and Isolation Panel [NHANES]    Frequency of Communication with Friends and Family: More than three times a week    Frequency of Social Gatherings with Friends and Family: More than three times a week    Attends Religious Services: 1 to 4 times per year    Active Member of [employer] or Organizations: No    Attends Archivist  Meetings: Never    Marital Status: Never married  Intimate Partner Violence: Not At Risk (05/28/2022)   Humiliation, Afraid, Rape, and Kick questionnaire    Fear of Current or Ex-Partner: No    Emotionally Abused: No    Physically Abused: No    Sexually Abused: No     Physical Exam   Vitals:   11/21/22 2239  BP: (!) 152/98  Pulse: 70  Resp: 18  Temp: 98.8 F (37.1 C)  SpO2: 100%    CONSTITUTIONAL: Well-appearing, NAD NEURO/PSYCH:  Alert and oriented x 3, no focal deficits EYES:  eyes equal and reactive ENT/NECK:  no LAD, no JVD CARDIO: Regular rate, well-perfused, normal S1 and S2 PULM:  CTAB no wheezing or rhonchi GI/GU:  non-distended, non-tender MSK/SPINE:  No gross deformities, no edema SKIN:  no rash, atraumatic   *Additional and/or pertinent findings included in MDM below  Diagnostic and Interventional Summary    EKG Interpretation  Date/Time:    Ventricular Rate:    PR Interval:    QRS Duration:   QT Interval:    QTC Calculation:   R Axis:     Text Interpretation:         Labs Reviewed - No data to display  No orders to display    Medications  tetracaine (PONTOCAINE) 0.5 % ophthalmic solution 2 drop (2 drops Both Eyes Given 11/21/22 2313)  fluorescein ophthalmic strip 1 strip (1 strip Both Eyes Given 11/21/22 2313)     Procedures  /  Critical Care Procedures  ED Course and Medical Decision Making  Initial Impression and Ddx Normal extraocular movements, pupils are equal and reactive, the lids were flipped there is no retained foreign body.  Normal and intact visual acuity bilaterally.  With fluorescein there is small abrasion to the conjunctive of the inferiorly.  Given the intact visual acuity and lack of any significant pain, doubt acute angle-closure glaucoma or any other more emergent process.  Past medical/surgical history that increases complexity of ED encounter: None  Interpretation of Diagnostics Laboratory and/or imaging options to aid  in the diagnosis/care of the patient were considered.  After careful history and physical examination, it was determined that there was no indication for diagnostics at this time.  Patient Reassessment and Ultimate Disposition/Management     Discharge  Patient management required discussion with the following services or consulting groups:  None  Complexity of Problems Addressed Acute complicated illness or Injury  Additional Data Reviewed  and Analyzed Further history obtained from: None  Additional Factors Impacting ED Encounter Risk Prescriptions  Barth Kirks. [place] Small, [place] [email or web address]  Final Clinical Impressions(s) / ED Diagnoses     ICD-10-CM   1. Abrasion of left conjunctiva, initial encounter  [email or web address]       ED Discharge Orders          Ordered    erythromycin ophthalmic ointment  4 times daily        11/21/22 2338             Discharge Instructions Discussed with and Provided to Patient:   Discharge Instructions      You were evaluated in the Emergency Department and after careful evaluation, we did not find any emergent condition requiring admission or further testing in the hospital.  Your exam/testing today is overall reassuring.  Symptoms seem to be due to a small scratch to the white part of your eye.  This should heal and feel better over the next few days.  Use the erythromycin ointment as directed.  Follow-up with your eye doctor if not improving over the next few days.  Please return to the Emergency Department if you experience any worsening of your condition.   Thank you for allowing [place] to be a part of your care.      Maudie Flakes, MD 11/21/22 [phone]

## 2022-11-21 NOTE — Discharge Instructions (Signed)
You were evaluated in the Emergency Department and after careful evaluation, we did not find any emergent condition requiring admission or further testing in the hospital.  Your exam/testing today is overall reassuring.  Symptoms seem to be due to a small scratch to the white part of your eye.  This should heal and feel better over the next few days.  Use the erythromycin ointment as directed.  Follow-up with your eye doctor if not improving over the next few days.  Please return to the Emergency Department if you experience any worsening of your condition.   Thank you for allowing [place] to be a part of your care.

## 2022-11-21 NOTE — ED Triage Notes (Signed)
Pt arrived from home via POV c/o left eye pain. Pt awoke approx 1900 with left eye pain, feels like there is something in it.

## 2022-12-22 ENCOUNTER — Ambulatory Visit: Payer: Medicaid Other | Admitting: Family Medicine

## 2023-02-04 ENCOUNTER — Ambulatory Visit: Payer: Medicaid Other | Admitting: Family Medicine

## 2023-03-10 ENCOUNTER — Ambulatory Visit (INDEPENDENT_AMBULATORY_CARE_PROVIDER_SITE_OTHER): Payer: Medicaid Other | Admitting: *Deleted

## 2023-03-10 ENCOUNTER — Other Ambulatory Visit: Payer: Self-pay | Admitting: Adult Health

## 2023-03-10 VITALS — BP 120/81 | HR 70 | Ht 69.0 in | Wt 225.0 lb

## 2023-03-10 DIAGNOSIS — Z3201 Encounter for pregnancy test, result positive: Secondary | ICD-10-CM

## 2023-03-10 DIAGNOSIS — N926 Irregular menstruation, unspecified: Secondary | ICD-10-CM

## 2023-03-10 LAB — POCT URINE PREGNANCY: Preg Test, Ur: POSITIVE — AB

## 2023-03-10 MED ORDER — PRENATAL 19 29-1 MG PO TABS: 1.0000 | ORAL_TABLET | Freq: Every day | ORAL | 99 refills | Status: DC

## 2023-03-10 NOTE — Progress Notes (Signed)
Refilled prenatal vitamin

## 2023-03-10 NOTE — Progress Notes (Signed)
   NURSE VISIT- PREGNANCY CONFIRMATION   SUBJECTIVE:  Mallory Willis is a 27 y.o. G76P2002 female at [redacted]w[redacted]d by certain LMP of Patient's last menstrual period was 10/03/2022. Here for pregnancy confirmation.  Home pregnancy test: positive x 1 back in February.  States she has not made an appointment because she has been in denial since she had just had a baby back in September.   She reports no complaints but is feeling movement.   She is not taking prenatal vitamins.    OBJECTIVE:  BP 120/81 (BP Location: Left Arm, Patient Position: Sitting, Cuff Size: Normal)   Pulse 70   Ht 5\' 9"  (1.753 m)   Wt 225 lb (102.1 kg)   LMP 10/03/2022   Breastfeeding No   BMI 33.23 kg/m   Appears well, in no apparent distress  Results for orders placed or performed in visit on 03/10/23 (from the past 24 hour(s))  POCT urine pregnancy   Collection Time: 03/10/23 11:31 AM  Result Value Ref Range   Preg Test, Ur Positive (A) Negative    ASSESSMENT: Positive pregnancy test, [redacted]w[redacted]d by LMP    PLAN: Schedule for dating ultrasound ASAP Prenatal vitamins: note routed to Cyril Mourning, NP to send prescription   Nausea medicines: not currently needed   OB packet given: Yes  Jobe Marker  03/10/2023 11:35 AM

## 2023-03-25 ENCOUNTER — Other Ambulatory Visit: Payer: Self-pay | Admitting: Obstetrics & Gynecology

## 2023-03-25 DIAGNOSIS — Z363 Encounter for antenatal screening for malformations: Secondary | ICD-10-CM

## 2023-03-26 ENCOUNTER — Ambulatory Visit (INDEPENDENT_AMBULATORY_CARE_PROVIDER_SITE_OTHER): Payer: Medicaid Other | Admitting: Obstetrics & Gynecology

## 2023-03-26 ENCOUNTER — Ambulatory Visit (INDEPENDENT_AMBULATORY_CARE_PROVIDER_SITE_OTHER): Payer: Medicaid Other

## 2023-03-26 ENCOUNTER — Encounter: Payer: Self-pay | Admitting: Obstetrics & Gynecology

## 2023-03-26 VITALS — BP 131/78 | HR 75 | Wt 224.0 lb

## 2023-03-26 DIAGNOSIS — Z363 Encounter for antenatal screening for malformations: Secondary | ICD-10-CM

## 2023-03-26 DIAGNOSIS — Z3A24 24 weeks gestation of pregnancy: Secondary | ICD-10-CM

## 2023-03-26 DIAGNOSIS — Z3480 Encounter for supervision of other normal pregnancy, unspecified trimester: Secondary | ICD-10-CM

## 2023-03-26 DIAGNOSIS — O0932 Supervision of pregnancy with insufficient antenatal care, second trimester: Secondary | ICD-10-CM | POA: Diagnosis not present

## 2023-03-26 DIAGNOSIS — Z3490 Encounter for supervision of normal pregnancy, unspecified, unspecified trimester: Secondary | ICD-10-CM | POA: Insufficient documentation

## 2023-03-26 MED ORDER — ASPIRIN 81 MG PO TBEC: 162.0000 mg | DELAYED_RELEASE_TABLET | Freq: Every day | ORAL | 12 refills | Status: DC

## 2023-03-26 NOTE — Progress Notes (Signed)
  Subjective:    Mallory Willis is being seen today for her first obstetrical visit.  This is not a planned pregnancy. She is at [redacted]w[redacted]d gestation. Her obstetrical history is significant for  gHTN + ppHTN .  Patient does intend to breast feed. Pregnancy history fully reviewed.  Patient reports no complaints.  Review of Systems:   Review of Systems  N/V cyclical, instructed to try to stop her marijuana use which is probably contributing  Objective:     BP 131/78   Pulse 75   Wt 224 lb (101.6 kg)   LMP 10/03/2022   BMI 33.08 kg/m  Physical Exam  Exam Gen WDWN NAD    Assessment:    Pregnancy: [id number] Patient Active Problem List   Diagnosis Date Noted   Late prenatal care in second trimester 03/26/2023   Encounter for supervision of normal pregnancy, antepartum 03/26/2023   Postpartum hypertension 06/09/2022   Shoulder (girdle) dystocia during labor and delivery 05/28/2022   VBAC, delivered 05/28/2022   History of gestational hypertension 12/05/2021       Plan:     Initial labs drawn. Today Sonogram reviewed and is normal today, see report LV EICF isolated Prenatal vitamins. Problem list reviewed and updated. AFP3 discussed:  too late  . Role of ultrasound in pregnancy discussed; fetal survey: performed today.  Follow up in 3 weeks. PN2    Lazaro Arms 03/26/2023

## 2023-03-26 NOTE — Progress Notes (Signed)
[place] 24+1 wks,cephalic,cx 4.1 cm,posterior placenta gr 0,normal ovaries,SVP of fluid 6.3 cm,LVEICF,FHR 154 bpm,EFW 684 g,anatomy complete,no obvious abnormalities  EDD 07/15/2023

## 2023-04-19 ENCOUNTER — Encounter: Payer: Medicaid Other | Admitting: Obstetrics and Gynecology

## 2023-04-19 ENCOUNTER — Other Ambulatory Visit: Payer: Medicaid Other

## 2023-04-27 ENCOUNTER — Encounter: Payer: Medicaid Other | Admitting: Obstetrics & Gynecology

## 2023-04-27 ENCOUNTER — Other Ambulatory Visit: Payer: Medicaid Other

## 2023-04-30 ENCOUNTER — Other Ambulatory Visit: Payer: Self-pay | Admitting: Advanced Practice Midwife

## 2023-05-03 ENCOUNTER — Ambulatory Visit (INDEPENDENT_AMBULATORY_CARE_PROVIDER_SITE_OTHER): Payer: Medicaid Other | Admitting: Women's Health

## 2023-05-03 ENCOUNTER — Encounter: Payer: Self-pay | Admitting: Women's Health

## 2023-05-03 ENCOUNTER — Other Ambulatory Visit: Payer: Medicaid Other

## 2023-05-03 VITALS — BP 115/74 | HR 80 | Wt 226.8 lb

## 2023-05-03 DIAGNOSIS — Z3483 Encounter for supervision of other normal pregnancy, third trimester: Secondary | ICD-10-CM

## 2023-05-03 DIAGNOSIS — Z131 Encounter for screening for diabetes mellitus: Secondary | ICD-10-CM | POA: Diagnosis not present

## 2023-05-03 DIAGNOSIS — E876 Hypokalemia: Secondary | ICD-10-CM

## 2023-05-03 DIAGNOSIS — Z98891 History of uterine scar from previous surgery: Secondary | ICD-10-CM

## 2023-05-03 DIAGNOSIS — Z8759 Personal history of other complications of pregnancy, childbirth and the puerperium: Secondary | ICD-10-CM | POA: Diagnosis not present

## 2023-05-03 DIAGNOSIS — Z3A30 30 weeks gestation of pregnancy: Secondary | ICD-10-CM

## 2023-05-03 DIAGNOSIS — Z3480 Encounter for supervision of other normal pregnancy, unspecified trimester: Secondary | ICD-10-CM

## 2023-05-03 DIAGNOSIS — O165 Unspecified maternal hypertension, complicating the puerperium: Secondary | ICD-10-CM

## 2023-05-03 NOTE — Progress Notes (Signed)
LOW-RISK PREGNANCY VISIT Patient name: Mallory Willis MRN [id number]  Date of birth: [birth year] Chief Complaint:   Routine Prenatal Visit (PN2 today)  History of Present Illness:   Mallory Willis is a 27 y.o. G73P2002 female at [redacted]w[redacted]d with an Estimated Date of Delivery: 07/10/23 being seen today for ongoing management of a low-risk pregnancy.   Today she reports pelvic pressure. Contractions: Not present. Vag. Bleeding: None.  Movement: Present. denies leaking of fluid.     03/26/2023   12:05 PM 03/24/2022    9:36 AM 12/05/2021    9:13 AM 08/14/2016    1:27 PM  Depression screen PHQ 2/9  Decreased Interest 0 1 0 0  Down, Depressed, Hopeless 0 0 0 0  PHQ - 2 Score 0 1 0 0  Altered sleeping  0 0 0  Tired, decreased energy 0 1 1 0  Change in appetite 0 1 1 2   Feeling bad or failure about yourself  0 0 0 0  Trouble concentrating 0 0 0 0  Moving slowly or fidgety/restless 0 0 0 0  Suicidal thoughts 0 0 0 0  PHQ-9 Score  3 2 2         03/26/2023   12:05 PM 03/24/2022    9:36 AM 12/05/2021    9:14 AM  GAD 7 : Generalized Anxiety Score  Nervous, Anxious, on Edge 0 0 0  Control/stop worrying 0 0 0  Worry too much - different things 0 0 0  Trouble relaxing 0 1 0  Restless 0 0 0  Easily annoyed or irritable 0 1 1  Afraid - awful might happen 0 0 0  Total GAD 7 Score 0 2 1      Review of Systems:   Pertinent items are noted in HPI Denies abnormal vaginal discharge w/ itching/odor/irritation, headaches, visual changes, shortness of breath, chest pain, abdominal pain, severe nausea/vomiting, or problems with urination or bowel movements unless otherwise stated above. Pertinent History Reviewed:  Reviewed past medical,surgical, social, obstetrical and family history.  Reviewed problem list, medications and allergies. Physical Assessment:   Vitals:   05/03/23 1034  BP: 115/74  Pulse: 80  Weight: 226 lb 12.8 oz (102.9 kg)  Body mass index is 33.49 kg/m.        Physical  Examination:   General appearance: Well appearing, and in no distress  Mental status: Alert, oriented to person, place, and time  Skin: Warm & dry  Cardiovascular: Normal heart rate noted  Respiratory: Normal respiratory effort, no distress  Abdomen: Soft, gravid, nontender  Pelvic: Cervical exam deferred         Extremities: Edema: None  Fetal Status: Fetal Heart Rate (bpm): 134 Fundal Height: 30 cm Movement: Present    Chaperone: N/A   No results found for this or any previous visit (from the past 24 hour(s)).  Assessment & Plan:  1) Low-risk pregnancy G3P2002 at [redacted]w[redacted]d with an Estimated Date of Delivery: 07/10/23   2) H/O gHTN and PPHTN, not taking ASA, pharmacy said insurance wouldn't cover, can get OTC  3) H/O C/S then VBAC> wants TOLAC  4) H/O shoulder dystocia> plan EFW ~ 36wk   Meds: No orders of the defined types were placed in this encounter.  Labs/procedures today:  PN1 & PN2 and urine labs, tdap next visit  Plan:  Continue routine obstetrical care  Next visit: prefers in person    Reviewed: Preterm labor symptoms and general obstetric precautions including but not limited to vaginal bleeding,  contractions, leaking of fluid and fetal movement were reviewed in detail with the patient.  All questions were answered. Does have home bp cuff. Office bp cuff given: not applicable. Check bp weekly, let us know if consistently >140 and/or >90.  Follow-up: Return for As scheduled.  Future Appointments  Date Time Provider Department Center  05/18/2023 10:10 AM Cheral Marker, CNM [facility]  05/31/2023 10:10 AM Cheral Marker, CNM [facility]  06/14/2023 11:10 AM Cheral Marker, CNM [facility]    Orders Placed This Encounter  Procedures   Comprehensive metabolic panel   Rubella screen   Hepatitis C antibody   Hepatitis B Surface AntiGEN   Cheral Marker CNM, WHNP-BC 05/03/2023 11:04 AM

## 2023-05-03 NOTE — Addendum Note (Signed)
Addended by: Freddie Apley R on: 05/03/2023 11:50 AM   Modules accepted: Orders

## 2023-05-03 NOTE — Patient Instructions (Signed)
[name], thank you for choosing our office today! We appreciate the opportunity to meet your healthcare needs. You may receive a short survey by mail, e-mail, or through [employer]. If you are happy with your care we would appreciate if you could take just a few minutes to complete the survey questions. We read all of your comments and take your feedback very seriously. Thank you again for choosing our office.  Center for [employer] Team at [facility]  [facility] at [facility] ([address] [place], [place] [address]) Entrance C, located off of [address] Free 24/7 valet parking   CLASSES: Go to [email or web address] to register for classes (childbirth, breastfeeding, waterbirth, infant CPR, daddy bootcamp, etc.)  Call the office [phone]) or go to [facility] if: You begin to have strong, frequent contractions Your water breaks.  Sometimes it is a big gush of fluid, sometimes it is just a trickle that keeps getting your panties wet or running down your legs You have vaginal bleeding.  It is normal to have a small amount of spotting if your cervix was checked.  You don't feel your baby moving like normal.  If you don't, get you something to eat and drink and lay down and focus on feeling your baby move.   If your baby is still not moving like normal, you should call the office or go to [facility]  Call the office [phone]) or go to [facility] hospital for these signs of pre-eclampsia: Severe headache that does not go away with Tylenol Visual changes- seeing spots, double, blurred vision Pain under your right breast or upper abdomen that does not go away with Tums or heartburn medicine Nausea and/or vomiting Severe swelling in your hands, feet, and face   Tdap Vaccine It is recommended that you get the Tdap vaccine during the third trimester of EACH pregnancy to help protect your baby from getting pertussis (whooping cough) 27-36 weeks is the BEST time to do  this so that you can pass the protection on to your baby. During pregnancy is better than after pregnancy, but if you are unable to get it during pregnancy it will be offered at the hospital.  You can get this vaccine with [place], at the health department, your family doctor, or some local pharmacies Everyone who will be around your baby should also be up-to-date on their vaccines before the baby comes. Adults (who are not pregnant) only need 1 dose of Tdap during adulthood.   [facility] Pediatricians/Family Doctors [facility]): [address] Colette Ribas, [phone]           [facility]: [address], [phone]                [facility]): [address], [phone] (call to ask if accepting patients) [facility] Department: [address], [employer], [phone]    [facility] Pediatricians/Family Doctors [facility]): [phone] [address], [phone] [facility]: [address] [place], [phone] [facility]: [address], [phone]  [facility] Doctors  [facility]): [phone] [facility]: [address], [phone]   [facility] Doctors [facility]: [address] [phone]  [facility] Family Doctors  [facility]: [phone], [phone]  Home Blood Pressure Monitoring for Patients   Your provider has recommended that you check your  blood pressure (BP) at least once a week at home. If you do not have a blood pressure cuff at home, one will be provided for you. Contact your provider if you have not received your monitor within 1 week.   Helpful Tips for Accurate Home Blood Pressure Checks  Don't smoke, exercise, or drink caffeine 30 minutes before checking your BP Use the restroom before checking your BP (a full bladder can raise your  pressure) Relax in a comfortable upright chair Feet on the ground Left arm resting comfortably on a flat surface at the level of your heart Legs uncrossed Back supported Sit quietly and don't talk Place the cuff on your bare arm Adjust snuggly, so that only two fingertips can fit between your skin and the top of the cuff Check 2 readings separated by at least one minute Keep a log of your BP readings For a visual, please reference this diagram: http://ccnc.care/bpdiagram  Provider Name: [facility]     Phone: [phone]  Zone 1: ALL CLEAR  Continue to monitor your symptoms:  BP reading is less than 140 (top number) or less than 90 (bottom number)  No right upper stomach pain No headaches or seeing spots No feeling nauseated or throwing up No swelling in face and hands  Zone 2: CAUTION Call your doctor's office for any of the following:  BP reading is greater than 140 (top number) or greater than 90 (bottom number)  Stomach pain under your ribs in the middle or right side Headaches or seeing spots Feeling nauseated or throwing up Swelling in face and hands  Zone 3: EMERGENCY  Seek immediate medical care if you have any of the following:  BP reading is greater than160 (top number) or greater than 110 (bottom number) Severe headaches not improving with Tylenol Serious difficulty catching your breath Any worsening symptoms from Zone 2   Third Trimester of Pregnancy The third trimester is from week 29 through week 42, months 7 through 9. The third trimester is a time when the fetus is growing rapidly. At the end of the ninth month, the fetus is about 20 inches in length and weighs 6-10 pounds.  BODY CHANGES Your body goes through many changes during pregnancy. The changes vary from woman to woman.  Your weight will continue to increase. You can expect to gain 25-35 pounds (11-16 kg) by the end of the pregnancy. You may begin to get stretch marks on your hips, abdomen,  and breasts. You may urinate more often because the fetus is moving lower into your pelvis and pressing on your bladder. You may develop or continue to have heartburn as a result of your pregnancy. You may develop constipation because certain hormones are causing the muscles that push waste through your intestines to slow down. You may develop hemorrhoids or swollen, bulging veins (varicose veins). You may have pelvic pain because of the weight gain and pregnancy hormones relaxing your joints between the bones in your pelvis. Backaches may result from overexertion of the muscles supporting your posture. You may have changes in your hair. These can include thickening of your hair, rapid growth, and changes in texture. Some women also have hair loss during or after pregnancy, or hair that feels dry or thin. Your hair will most likely return to normal after your baby is born. Your breasts will continue to grow and be tender. A yellow discharge may leak from your breasts called colostrum. Your belly button may stick out. You may   feel short of breath because of your expanding uterus. You may notice the fetus "dropping," or moving lower in your abdomen. You may have a bloody mucus discharge. This usually occurs a few days to a week before labor begins. Your cervix becomes thin and soft (effaced) near your due date. WHAT TO EXPECT AT YOUR PRENATAL EXAMS  You will have prenatal exams every 2 weeks until week 36. Then, you will have weekly prenatal exams. During a routine prenatal visit: You will be weighed to make sure you and the fetus are growing normally. Your blood pressure is taken. Your abdomen will be measured to track your baby's growth. The fetal heartbeat will be listened to. Any test results from the previous visit will be discussed. You may have a cervical check near your due date to see if you have effaced. At around 36 weeks, your caregiver will check your cervix. At the same time, your  caregiver will also perform a test on the secretions of the vaginal tissue. This test is to determine if a type of bacteria, Group B streptococcus, is present. Your caregiver will explain this further. Your caregiver may ask you: What your birth plan is. How you are feeling. If you are feeling the baby move. If you have had any abnormal symptoms, such as leaking fluid, bleeding, severe headaches, or abdominal cramping. If you have any questions. Other tests or screenings that may be performed during your third trimester include: Blood tests that check for low iron levels (anemia). Fetal testing to check the health, activity level, and growth of the fetus. Testing is done if you have certain medical conditions or if there are problems during the pregnancy. FALSE LABOR You may feel small, irregular contractions that eventually go away. These are called Braxton Hicks contractions, or false labor. Contractions may last for hours, days, or even weeks before true labor sets in. If contractions come at regular intervals, intensify, or become painful, it is best to be seen by your caregiver.  SIGNS OF LABOR  Menstrual-like cramps. Contractions that are 5 minutes apart or less. Contractions that start on the top of the uterus and spread down to the lower abdomen and back. A sense of increased pelvic pressure or back pain. A watery or bloody mucus discharge that comes from the vagina. If you have any of these signs before the 37th week of pregnancy, call your caregiver right away. You need to go to the hospital to get checked immediately. HOME CARE INSTRUCTIONS  Avoid all smoking, herbs, alcohol, and unprescribed drugs. These chemicals affect the formation and growth of the baby. Follow your caregiver's instructions regarding medicine use. There are medicines that are either safe or unsafe to take during pregnancy. Exercise only as directed by your caregiver. Experiencing uterine cramps is a good sign to  stop exercising. Continue to eat regular, healthy meals. Wear a good support bra for breast tenderness. Do not use hot tubs, steam rooms, or saunas. Wear your seat belt at all times when driving. Avoid raw meat, uncooked cheese, cat litter boxes, and soil used by cats. These carry germs that can cause birth defects in the baby. Take your prenatal vitamins. Try taking a stool softener (if your caregiver approves) if you develop constipation. Eat more high-fiber foods, such as fresh vegetables or fruit and whole grains. Drink plenty of fluids to keep your urine clear or pale yellow. Take warm sitz baths to soothe any pain or discomfort caused by hemorrhoids. Use hemorrhoid cream if   your caregiver approves. If you develop varicose veins, wear support hose. Elevate your feet for 15 minutes, 3-4 times a day. Limit salt in your diet. Avoid heavy lifting, wear low heal shoes, and practice good posture. Rest a lot with your legs elevated if you have leg cramps or low back pain. Visit your dentist if you have not gone during your pregnancy. Use a soft toothbrush to brush your teeth and be gentle when you floss. A sexual relationship may be continued unless your caregiver directs you otherwise. Do not travel far distances unless it is absolutely necessary and only with the approval of your caregiver. Take prenatal classes to understand, practice, and ask questions about the labor and delivery. Make a trial run to the hospital. Pack your hospital bag. Prepare the baby's nursery. Continue to go to all your prenatal visits as directed by your caregiver. SEEK MEDICAL CARE IF: You are unsure if you are in labor or if your water has broken. You have dizziness. You have mild pelvic cramps, pelvic pressure, or nagging pain in your abdominal area. You have persistent nausea, vomiting, or diarrhea. You have a bad smelling vaginal discharge. You have pain with urination. SEEK IMMEDIATE MEDICAL CARE IF:  You  have a fever. You are leaking fluid from your vagina. You have spotting or bleeding from your vagina. You have severe abdominal cramping or pain. You have rapid weight loss or gain. You have shortness of breath with chest pain. You notice sudden or extreme swelling of your face, hands, ankles, feet, or legs. You have not felt your baby move in over an hour. You have severe headaches that do not go away with medicine. You have vision changes. Document Released: 08/25/2001 Document Revised: 09/05/2013 Document Reviewed: 11/01/2012 [facility] Patient Information 2015 [place], [place]. This information is not intended to replace advice given to you by your health care provider. Make sure you discuss any questions you have with your health care provider.

## 2023-05-04 ENCOUNTER — Other Ambulatory Visit: Payer: Self-pay | Admitting: Women's Health

## 2023-05-04 DIAGNOSIS — E876 Hypokalemia: Secondary | ICD-10-CM | POA: Insufficient documentation

## 2023-05-04 LAB — GLUCOSE TOLERANCE, 2 HOURS W/ 1HR
Glucose, 1 hour: 73 mg/dL (ref 70–179)
Glucose, 2 hour: 78 mg/dL (ref 70–152)
Glucose, Fasting: 79 mg/dL (ref 70–91)

## 2023-05-04 LAB — HIV ANTIBODY (ROUTINE TESTING W REFLEX): HIV Screen 4th Generation wRfx: NONREACTIVE

## 2023-05-04 LAB — CBC
Hematocrit: 32.5 % — ABNORMAL LOW (ref 34.0–46.6)
Hemoglobin: 10.5 g/dL — ABNORMAL LOW (ref 11.1–15.9)
MCH: 29.1 pg (ref 26.6–33.0)
MCHC: 32.3 g/dL (ref 31.5–35.7)
MCV: 90 fL (ref 79–97)
Platelets: 262 10*3/uL (ref 150–450)
RBC: 3.61 x10E6/uL — ABNORMAL LOW (ref 3.77–5.28)
RDW: 14.1 % (ref 11.7–15.4)
WBC: 6.5 10*3/uL (ref 3.4–10.8)

## 2023-05-04 LAB — COMPREHENSIVE METABOLIC PANEL
ALT: 4 IU/L (ref 0–32)
AST: 13 IU/L (ref 0–40)
Albumin: 3.5 g/dL — ABNORMAL LOW (ref 4.0–5.0)
Alkaline Phosphatase: 81 IU/L (ref 44–121)
BUN/Creatinine Ratio: 16 (ref 9–23)
BUN: 7 mg/dL (ref 6–20)
Bilirubin Total: <0.2 mg/dL (ref 0.0–1.2)
CO2: 22 mmol/L (ref 20–29)
Calcium: 8.6 mg/dL — ABNORMAL LOW (ref 8.7–10.2)
Chloride: 105 mmol/L (ref 96–106)
Creatinine, Ser: 0.45 mg/dL — ABNORMAL LOW (ref 0.57–1.00)
Globulin, Total: 2.4 g/dL (ref 1.5–4.5)
Glucose: 77 mg/dL (ref 70–99)
Potassium: 3.3 mmol/L — ABNORMAL LOW (ref 3.5–5.2)
Sodium: 139 mmol/L (ref 134–144)
Total Protein: 5.9 g/dL — ABNORMAL LOW (ref 6.0–8.5)
eGFR: 137 mL/min/{1.73_m2} (ref >59)

## 2023-05-04 LAB — PROTEIN / CREATININE RATIO, URINE
Creatinine, Urine: 267.3 mg/dL
Protein, Ur: 30.2 mg/dL
Protein/Creat Ratio: 113 mg/g{creat} (ref 0–200)

## 2023-05-04 LAB — HEPATITIS C ANTIBODY: Hep C Virus Ab: NONREACTIVE

## 2023-05-04 LAB — RPR: RPR Ser Ql: NONREACTIVE

## 2023-05-04 LAB — ANTIBODY SCREEN: Antibody Screen: NEGATIVE

## 2023-05-04 LAB — HEPATITIS B SURFACE ANTIGEN: Hepatitis B Surface Ag: NEGATIVE

## 2023-05-04 LAB — RUBELLA SCREEN: Rubella Antibodies, IGG: 1.7 {index} (ref >0.99)

## 2023-05-04 MED ORDER — FERROUS FUMARATE 325 (106 FE) MG PO TABS: 1.0000 | ORAL_TABLET | ORAL | 4 refills | Status: DC

## 2023-05-04 MED ORDER — POTASSIUM CHLORIDE CRYS ER 20 MEQ PO TBCR: 20.0000 meq | EXTENDED_RELEASE_TABLET | Freq: Every day | ORAL | 2 refills | Status: DC

## 2023-05-04 NOTE — Addendum Note (Signed)
Addended by: Cheral Marker on: 05/04/2023 02:47 PM   Modules accepted: Orders

## 2023-05-05 LAB — URINE CULTURE

## 2023-05-06 ENCOUNTER — Encounter: Payer: Self-pay | Admitting: Women's Health

## 2023-05-06 ENCOUNTER — Telehealth: Payer: Self-pay | Admitting: *Deleted

## 2023-05-06 DIAGNOSIS — A749 Chlamydial infection, unspecified: Secondary | ICD-10-CM | POA: Insufficient documentation

## 2023-05-06 LAB — GC/CHLAMYDIA PROBE AMP
Chlamydia trachomatis, NAA: POSITIVE — AB
Neisseria Gonorrhoeae by PCR: NEGATIVE

## 2023-05-06 MED ORDER — AZITHROMYCIN 500 MG PO TABS: 1000.0000 mg | ORAL_TABLET | Freq: Once | ORAL | 0 refills | Status: AC

## 2023-05-06 NOTE — Telephone Encounter (Signed)
Called patient to see if she has taken Azithromycin in the past. States she has taken it in 2018 and twice in 2023 when she was positive then and had no issues after taking it. Advised no sex for 7 days after taking medication and partner will need treatment. Patient states she would like for [place] to treat.  Mallory Willis DOB [birth year], [facility]  NKDA

## 2023-05-06 NOTE — Addendum Note (Signed)
Addended by: Cheral Marker on: 05/06/2023 11:35 AM   Modules accepted: Orders

## 2023-05-18 ENCOUNTER — Encounter: Payer: Medicaid Other | Admitting: Women's Health

## 2023-05-19 ENCOUNTER — Ambulatory Visit (INDEPENDENT_AMBULATORY_CARE_PROVIDER_SITE_OTHER): Payer: Medicaid Other | Admitting: Obstetrics and Gynecology

## 2023-05-19 ENCOUNTER — Encounter: Payer: Self-pay | Admitting: Obstetrics and Gynecology

## 2023-05-19 VITALS — BP 115/77 | HR 82 | Wt 229.0 lb

## 2023-05-19 DIAGNOSIS — Z8759 Personal history of other complications of pregnancy, childbirth and the puerperium: Secondary | ICD-10-CM

## 2023-05-19 DIAGNOSIS — O98813 Other maternal infectious and parasitic diseases complicating pregnancy, third trimester: Secondary | ICD-10-CM

## 2023-05-19 DIAGNOSIS — O99013 Anemia complicating pregnancy, third trimester: Secondary | ICD-10-CM

## 2023-05-19 DIAGNOSIS — Z98891 History of uterine scar from previous surgery: Secondary | ICD-10-CM

## 2023-05-19 DIAGNOSIS — A749 Chlamydial infection, unspecified: Secondary | ICD-10-CM

## 2023-05-19 DIAGNOSIS — Z3483 Encounter for supervision of other normal pregnancy, third trimester: Secondary | ICD-10-CM

## 2023-05-19 DIAGNOSIS — Z3A32 32 weeks gestation of pregnancy: Secondary | ICD-10-CM

## 2023-05-19 MED ORDER — FERROUS GLUCONATE 324 (38 FE) MG PO TABS: 324.0000 mg | ORAL_TABLET | Freq: Every day | ORAL | 3 refills | Status: DC

## 2023-05-19 NOTE — Progress Notes (Signed)
   PRENATAL VISIT NOTE  Subjective:  Mallory Willis is a 27 y.o. G3P2002 at [redacted]w[redacted]d being seen today for ongoing prenatal care.  She is currently monitored for the following issues for this low-risk pregnancy and has History of gestational hypertension & PPHTN; Previous cesarean section; History of shoulder dystocia in prior pregnancy; VBAC, delivered; Late prenatal care in second trimester; Encounter for supervision of normal pregnancy, antepartum; Hypokalemia; and Chlamydia on their problem list.  Patient reports no complaints.  Contractions: Not present. Vag. Bleeding: None.  Movement: Present. Denies leaking of fluid.   The following portions of the patient's history were reviewed and updated as appropriate: allergies, current medications, past family history, past medical history, past social history, past surgical history and problem list.   Objective:   Vitals:   05/19/23 1157  BP: 115/77  Pulse: 82  Weight: 229 lb (103.9 kg)    Fetal Status: Fetal Heart Rate (bpm): 141 Fundal Height: 33 cm Movement: Present     General:  Alert, oriented and cooperative. Patient is in no acute distress.  Skin: Skin is warm and dry. No rash noted.   Cardiovascular: Normal heart rate noted  Respiratory: Normal respiratory effort, no problems with respiration noted  Abdomen: Soft, gravid, appropriate for gestational age.  Pain/Pressure: Absent     Pelvic: Cervical exam deferred        Extremities: Normal range of motion.  Edema: None  Mental Status: Normal mood and affect. Normal behavior. Normal judgment and thought content.   Assessment and Plan:  Pregnancy: G3P2002 at [redacted]w[redacted]d 1. Encounter for supervision of other normal pregnancy in third trimester BP and FHR normal Feeling regular fetal movement Discussed TDAP and is okay with it, left before we administered, will offer next visit   2. History of cesarean section 2018, had successful vbac in 2023, desires TOLAC  3. History of gestational  hypertension & PPHTN Normotensive, no pec s&s, taking ASA  4. Chlamydia infection affecting pregnancy in third trimester Just finished abx, will plan for TOC next visit   5. Anemia affecting pregnancy in third trimester  - ferrous gluconate (FERGON) 324 MG tablet; Take 1 tablet (324 mg total) by mouth daily with breakfast.  Dispense: 30 tablet; Refill: 3  6. [redacted] weeks gestation of pregnancy Anticipatory guidance regarding upcoming appts  7. History of shoulder dystocia in prior pregnancy Plan efw 36 wks   Preterm labor symptoms and general obstetric precautions including but not limited to vaginal bleeding, contractions, leaking of fluid and fetal movement were reviewed in detail with the patient. Please refer to After Visit Summary for other counseling recommendations.   Return in two weeks for routine prenatal   Future Appointments  Date Time Provider Department Center  05/31/2023 10:10 AM Cheral Marker, CNM [facility]  06/14/2023 11:10 AM Cheral Marker, CNM [facility]    Albertine Grates, FNP

## 2023-05-31 ENCOUNTER — Encounter: Payer: Medicaid Other | Admitting: Women's Health

## 2023-06-14 ENCOUNTER — Encounter: Payer: Medicaid Other | Admitting: Women's Health

## 2023-06-14 ENCOUNTER — Other Ambulatory Visit (HOSPITAL_COMMUNITY)
Admission: RE | Admit: 2023-06-14 | Discharge: 2023-06-14 | Disposition: A | Discharge status: Home / Self Care | Payer: Medicaid Other | Source: Ambulatory Visit | Attending: Women's Health | Admitting: Women's Health

## 2023-06-14 ENCOUNTER — Encounter: Payer: Self-pay | Admitting: Women's Health

## 2023-06-14 ENCOUNTER — Ambulatory Visit (INDEPENDENT_AMBULATORY_CARE_PROVIDER_SITE_OTHER): Payer: Medicaid Other | Admitting: Women's Health

## 2023-06-14 VITALS — BP 134/87 | HR 76 | Wt 228.0 lb

## 2023-06-14 DIAGNOSIS — Z23 Encounter for immunization: Secondary | ICD-10-CM | POA: Diagnosis not present

## 2023-06-14 DIAGNOSIS — Z3483 Encounter for supervision of other normal pregnancy, third trimester: Secondary | ICD-10-CM

## 2023-06-14 DIAGNOSIS — Z3A36 36 weeks gestation of pregnancy: Secondary | ICD-10-CM

## 2023-06-14 DIAGNOSIS — Z3480 Encounter for supervision of other normal pregnancy, unspecified trimester: Secondary | ICD-10-CM

## 2023-06-14 DIAGNOSIS — A749 Chlamydial infection, unspecified: Secondary | ICD-10-CM

## 2023-06-14 DIAGNOSIS — Z8759 Personal history of other complications of pregnancy, childbirth and the puerperium: Secondary | ICD-10-CM

## 2023-06-14 NOTE — Patient Instructions (Addendum)
Mallory Willis, thank you for choosing our office today! We appreciate the opportunity to meet your healthcare needs. You may receive a short survey by mail, e-mail, or through [employer]. If you are happy with your care we would appreciate if you could take just a few minutes to complete the survey questions. We read all of your comments and take your feedback very seriously. Thank you again for choosing our office.  Center for [employer] Team at [facility]  [facility] at [facility] ([address] [place], [place] [address]) Entrance C, located off of [address] [employer] valet parking   [email or web address] for breast pump  CLASSES: Go to [email or web address] to register for classes (childbirth, breastfeeding, waterbirth, infant CPR, daddy bootcamp, etc.)  Call the office [phone]) or go to [facility] if: You begin to have strong, frequent contractions Your water breaks.  Sometimes it is a big gush of fluid, sometimes it is just a trickle that keeps getting your panties wet or running down your legs You have vaginal bleeding.  It is normal to have a small amount of spotting if your cervix was checked.  You don't feel your baby moving like normal.  If you don't, get you something to eat and drink and lay down and focus on feeling your baby move.   If your baby is still not moving like normal, you should call the office or go to [facility].  Call the office [phone]) or go to [facility] hospital for these signs of pre-eclampsia: Severe headache that does not go away with Tylenol Visual changes- seeing spots, double, blurred vision Pain under your right breast or upper abdomen that does not go away with Tums or heartburn medicine Nausea and/or vomiting Severe swelling in your hands, feet, and face   Tdap Vaccine It is recommended that you get the Tdap vaccine during the third trimester of EACH pregnancy to help protect your baby from getting pertussis (whooping cough) 27-36  weeks is the BEST time to do this so that you can pass the protection on to your baby. During pregnancy is better than after pregnancy, but if you are unable to get it during pregnancy it will be offered at the hospital.  You can get this vaccine with [place], at the health department, your family doctor, or some local pharmacies Everyone who will be around your baby should also be up-to-date on their vaccines before the baby comes. Adults (who are not pregnant) only need 1 dose of Tdap during adulthood.   [facility] Pediatricians/Family Doctors [facility]): [address] Colette Ribas, [phone]           [facility]: [address], [phone]                [facility] Medicine [facility]): [address], [phone] (call to ask if accepting patients) [facility] Department: [address], [place], [phone]    [facility] Pediatricians/Family Doctors [facility]): [phone] [address], [phone] [facility]: [address] [place], [phone] [facility] [place]: [address], [phone]  [facility] Doctors  [facility]): [phone] [facility]: [address], [phone]   [facility] Doctors [facility]: [address] [phone]  [facility] Doctors  [facility]: [phone], [phone]  Home Blood Pressure Monitoring for Patients   Your provider has  recommended that you check your blood pressure (BP) at least once a week at home. If you do not have a blood pressure cuff at home, one will be provided for you. Contact your provider if you have not received your monitor within 1 week.   Helpful Tips for Accurate Home Blood Pressure Checks  Don't smoke, exercise, or drink caffeine 30 minutes before checking your BP Use the restroom before checking your BP (a  full bladder can raise your pressure) Relax in a comfortable upright chair Feet on the ground Left arm resting comfortably on a flat surface at the level of your heart Legs uncrossed Back supported Sit quietly and don't talk Place the cuff on your bare arm Adjust snuggly, so that only two fingertips can fit between your skin and the top of the cuff Check 2 readings separated by at least one minute Keep a log of your BP readings For a visual, please reference this diagram: http://ccnc.care/bpdiagram  Provider Name: [facility]     Phone: [phone]  Zone 1: ALL CLEAR  Continue to monitor your symptoms:  BP reading is less than 140 (top number) or less than 90 (bottom number)  No right upper stomach pain No headaches or seeing spots No feeling nauseated or throwing up No swelling in face and hands  Zone 2: CAUTION Call your doctor's office for any of the following:  BP reading is greater than 140 (top number) or greater than 90 (bottom number)  Stomach pain under your ribs in the middle or right side Headaches or seeing spots Feeling nauseated or throwing up Swelling in face and hands  Zone 3: EMERGENCY  Seek immediate medical care if you have any of the following:  BP reading is greater than160 (top number) or greater than 110 (bottom number) Severe headaches not improving with Tylenol Serious difficulty catching your breath Any worsening symptoms from Zone 2  Preterm Labor and Birth Information  The normal length of a pregnancy is 39-41 weeks. Preterm labor is when labor starts before 37 completed weeks of pregnancy. What are the risk factors for preterm labor? Preterm labor is more likely to occur in women who: Have certain infections during pregnancy such as a bladder infection, sexually transmitted infection, or infection inside the uterus (chorioamnionitis). Have a shorter-than-normal cervix. Have gone into preterm labor before. Have had surgery on their  cervix. Are younger than age 41 or older than age 49. Are African American. Are pregnant with twins or multiple babies (multiple gestation). Take street drugs or smoke while pregnant. Do not gain enough weight while pregnant. Became pregnant shortly after having been pregnant. What are the symptoms of preterm labor? Symptoms of preterm labor include: Cramps similar to those that can happen during a menstrual period. The cramps may happen with diarrhea. Pain in the abdomen or lower back. Regular uterine contractions that may feel like tightening of the abdomen. A feeling of increased pressure in the pelvis. Increased watery or bloody mucus discharge from the vagina. Water breaking (ruptured amniotic sac). Why is it important to recognize signs of preterm labor? It is important to recognize signs of preterm labor because babies who are born prematurely may not be fully developed. This can put them at an increased risk for: Long-term (chronic) heart and lung problems. Difficulty immediately after birth with regulating body systems, including blood sugar, body temperature, heart rate, and breathing rate. Bleeding in the brain. Cerebral palsy. Learning difficulties. Death. These risks are highest for babies who  are born before 34 weeks of pregnancy. How is preterm labor treated? Treatment depends on the length of your pregnancy, your condition, and the health of your baby. It may involve: Having a stitch (suture) placed in your cervix to prevent your cervix from opening too early (cerclage). Taking or being given medicines, such as: Hormone medicines. These may be given early in pregnancy to help support the pregnancy. Medicine to stop contractions. Medicines to help mature the baby's lungs. These may be prescribed if the risk of delivery is high. Medicines to prevent your baby from developing cerebral palsy. If the labor happens before 34 weeks of pregnancy, you may need to stay in the  hospital. What should I do if I think I am in preterm labor? If you think that you are going into preterm labor, call your health care provider right away. How can I prevent preterm labor in future pregnancies? To increase your chance of having a full-term pregnancy: Do not use any tobacco products, such as cigarettes, chewing tobacco, and e-cigarettes. If you need help quitting, ask your health care provider. Do not use street drugs or medicines that have not been prescribed to you during your pregnancy. Talk with your health care provider before taking any herbal supplements, even if you have been taking them regularly. Make sure you gain a healthy amount of weight during your pregnancy. Watch for infection. If you think that you might have an infection, get it checked right away. Make sure to tell your health care provider if you have gone into preterm labor before. This information is not intended to replace advice given to you by your health care provider. Make sure you discuss any questions you have with your health care provider. Document Revised: 12/23/2018 Document Reviewed: 01/22/2016 Elsevier Patient Education  2020 [employer].

## 2023-06-14 NOTE — Progress Notes (Signed)
LOW-RISK PREGNANCY VISIT Patient name: Mallory Willis MRN [id number]  Date of birth: 05-05-1996 Chief Complaint:   Routine Prenatal Visit  History of Present Illness:   Mallory Willis is a 27 y.o. G70P2002 female at [redacted]w[redacted]d with an Estimated Date of Delivery: 07/10/23 being seen today for ongoing management of a low-risk pregnancy.   Today she reports  occ random bright red vaginal bleeding, had some yesterday, wasn't after sex . Denies abnormal discharge, itching/odor/irritation.  Recent +CT, took meds.  Contractions: Not present. Vag. Bleeding: Scant.  Movement: Present. denies leaking of fluid.     03/26/2023   12:05 PM 03/24/2022    9:36 AM 12/05/2021    9:13 AM 08/14/2016    1:27 PM  Depression screen PHQ 2/9  Decreased Interest 0 1 0 0  Down, Depressed, Hopeless 0 0 0 0  PHQ - 2 Score 0 1 0 0  Altered sleeping  0 0 0  Tired, decreased energy 0 1 1 0  Change in appetite 0 1 1 2   Feeling bad or failure about yourself  0 0 0 0  Trouble concentrating 0 0 0 0  Moving slowly or fidgety/restless 0 0 0 0  Suicidal thoughts 0 0 0 0  PHQ-9 Score  3 2 2         03/26/2023   12:05 PM 03/24/2022    9:36 AM 12/05/2021    9:14 AM  GAD 7 : Generalized Anxiety Score  Nervous, Anxious, on Edge 0 0 0  Control/stop worrying 0 0 0  Worry too much - different things 0 0 0  Trouble relaxing 0 1 0  Restless 0 0 0  Easily annoyed or irritable 0 1 1  Afraid - awful might happen 0 0 0  Total GAD 7 Score 0 2 1      Review of Systems:   Pertinent items are noted in HPI Denies abnormal vaginal discharge w/ itching/odor/irritation, headaches, visual changes, shortness of breath, chest pain, abdominal pain, severe nausea/vomiting, or problems with urination or bowel movements unless otherwise stated above. Pertinent History Reviewed:  Reviewed past medical,surgical, social, obstetrical and family history.  Reviewed problem list, medications and allergies. Physical Assessment:   Vitals:   06/14/23  1153  BP: 134/87  Pulse: 76  Weight: 228 lb (103.4 kg)  Body mass index is 33.67 kg/m.        Physical Examination:   General appearance: Well appearing, and in no distress  Mental status: Alert, oriented to person, place, and time  Skin: Warm & dry  Cardiovascular: Normal heart rate noted  Respiratory: Normal respiratory effort, no distress  Abdomen: Soft, gravid, nontender  Pelvic:  spec exam: no blood, CV and GBS obtained   Dilation: 1 Effacement (%): Thick Station: -3  Extremities: Edema: None  Fetal Status: Fetal Heart Rate (bpm): 143 Fundal Height: 35 cm Movement: Present Presentation: Vertex  Chaperone: Faith Rogue   No results found for this or any previous visit (from the past 24 hour(s)).  Assessment & Plan:  1) Low-risk pregnancy G3P2002 at [redacted]w[redacted]d with an Estimated Date of Delivery: 07/10/23   2) H/O gHTN/PPHTN, check bp daily, let us know if >140/90, reviewed pre-e s/s, reasons to seek care  3) Prev c/s then VBAC w/ shoulder dystocia> get EFW next visit, then decide route of delivery  4) Recent +CT> POC today  5) Occ random vb> none now, CV swab sent, reviewed warning s/s, reasons to seek care   Meds: No orders  of the defined types were placed in this encounter.  Labs/procedures today: spec exam, CV swab, GBS, and tdap  Plan:  Continue routine obstetrical care  Next visit: prefers will be in person for u/s     Reviewed: Preterm labor symptoms and general obstetric precautions including but not limited to vaginal bleeding, contractions, leaking of fluid and fetal movement were reviewed in detail with the patient.  All questions were answered. Does have home bp cuff. Office bp cuff given: not applicable. Check bp weekly, let us know if consistently >140 and/or >90.  Follow-up: Return in about 1 week (around 06/21/2023) for LROB, US:EFW, MD, in person; then weekly LROB.  No future appointments.  Orders Placed This Encounter  Procedures   Culture, beta strep  (group b only)   US OB Follow Up   Tdap vaccine greater than or equal to 7yo IM   Cheral Marker CNM, [facility] 06/14/2023 12:18 PM

## 2023-06-15 ENCOUNTER — Encounter: Payer: Self-pay | Admitting: Women's Health

## 2023-06-15 DIAGNOSIS — A599 Trichomoniasis, unspecified: Secondary | ICD-10-CM | POA: Insufficient documentation

## 2023-06-15 LAB — CERVICOVAGINAL ANCILLARY ONLY
Bacterial Vaginitis (gardnerella): NEGATIVE
Candida Glabrata: POSITIVE — AB
Candida Vaginitis: POSITIVE — AB
Chlamydia: POSITIVE — AB
Comment: NEGATIVE
Comment: NEGATIVE
Comment: NEGATIVE
Comment: NEGATIVE
Comment: NEGATIVE
Comment: NORMAL
Neisseria Gonorrhea: NEGATIVE
Trichomonas: POSITIVE — AB

## 2023-06-15 MED ORDER — AZITHROMYCIN 500 MG PO TABS: 1000.0000 mg | ORAL_TABLET | Freq: Once | ORAL | 0 refills | Status: AC

## 2023-06-15 MED ORDER — METRONIDAZOLE 500 MG PO TABS: 500.0000 mg | ORAL_TABLET | Freq: Two times a day (BID) | ORAL | 0 refills | Status: DC

## 2023-06-15 MED ORDER — TERCONAZOLE 0.4 % VA CREA: 1.0000 | TOPICAL_CREAM | Freq: Every day | VAGINAL | 0 refills | Status: DC

## 2023-06-15 NOTE — Addendum Note (Signed)
Addended by: Cheral Marker on: 06/15/2023 09:04 AM   Modules accepted: Orders

## 2023-06-17 LAB — CULTURE, BETA STREP (GROUP B ONLY): Strep Gp B Culture: POSITIVE — AB

## 2023-06-18 ENCOUNTER — Encounter: Payer: Self-pay | Admitting: Women's Health

## 2023-06-21 ENCOUNTER — Other Ambulatory Visit: Payer: Medicaid Other | Admitting: Radiology

## 2023-06-21 ENCOUNTER — Encounter: Payer: Medicaid Other | Admitting: Obstetrics & Gynecology

## 2023-06-23 ENCOUNTER — Ambulatory Visit (INDEPENDENT_AMBULATORY_CARE_PROVIDER_SITE_OTHER): Payer: Medicaid Other | Admitting: Radiology

## 2023-06-23 ENCOUNTER — Encounter: Payer: Self-pay | Admitting: Obstetrics & Gynecology

## 2023-06-23 ENCOUNTER — Ambulatory Visit (INDEPENDENT_AMBULATORY_CARE_PROVIDER_SITE_OTHER): Payer: Medicaid Other | Admitting: Obstetrics & Gynecology

## 2023-06-23 VITALS — BP 126/67 | HR 71 | Wt 228.6 lb

## 2023-06-23 DIAGNOSIS — Z3483 Encounter for supervision of other normal pregnancy, third trimester: Secondary | ICD-10-CM

## 2023-06-23 DIAGNOSIS — Z3A37 37 weeks gestation of pregnancy: Secondary | ICD-10-CM | POA: Diagnosis not present

## 2023-06-23 DIAGNOSIS — A749 Chlamydial infection, unspecified: Secondary | ICD-10-CM

## 2023-06-23 DIAGNOSIS — O98813 Other maternal infectious and parasitic diseases complicating pregnancy, third trimester: Secondary | ICD-10-CM

## 2023-06-23 DIAGNOSIS — Z8759 Personal history of other complications of pregnancy, childbirth and the puerperium: Secondary | ICD-10-CM | POA: Diagnosis not present

## 2023-06-23 DIAGNOSIS — Z98891 History of uterine scar from previous surgery: Secondary | ICD-10-CM

## 2023-06-23 MED ORDER — AZITHROMYCIN 500 MG PO TABS: 1000.0000 mg | ORAL_TABLET | Freq: Every day | ORAL | 1 refills | Status: AC

## 2023-06-23 NOTE — Progress Notes (Signed)
LOW-RISK PREGNANCY VISIT Patient name: Mallory Willis MRN [id number]  Date of birth: 07/06/1996 Chief Complaint:   Routine Prenatal Visit  History of Present Illness:   Mallory Willis is a 27 y.o. G82P2002 female at [redacted]w[redacted]d with an Estimated Date of Delivery: 07/10/23 being seen today for ongoing management of a low-risk pregnancy.   -Recent Chlamydia/Trich/BV- prescriptions sent in -Tobacco use -Prior C-section, desires TOLAC -GBS bacteruria     03/26/2023   12:05 PM 03/24/2022    9:36 AM 12/05/2021    9:13 AM 08/14/2016    1:27 PM  Depression screen PHQ 2/9  Decreased Interest 0 1 0 0  Down, Depressed, Hopeless 0 0 0 0  PHQ - 2 Score 0 1 0 0  Altered sleeping  0 0 0  Tired, decreased energy 0 1 1 0  Change in appetite 0 1 1 2   Feeling bad or failure about yourself  0 0 0 0  Trouble concentrating 0 0 0 0  Moving slowly or fidgety/restless 0 0 0 0  Suicidal thoughts 0 0 0 0  PHQ-9 Score  3 2 2     Today she reports no complaints. Contractions: Irritability. Vag. Bleeding: None.  Movement: Present. denies leaking of fluid. Review of Systems:   Pertinent items are noted in HPI Denies abnormal vaginal discharge w/ itching/odor/irritation, headaches, visual changes, shortness of breath, chest pain, abdominal pain, severe nausea/vomiting, or problems with urination or bowel movements unless otherwise stated above. Pertinent History Reviewed:  Reviewed past medical,surgical, social, obstetrical and family history.  Reviewed problem list, medications and allergies.  Physical Assessment:   Vitals:   06/23/23 1002  BP: 126/67  Pulse: 71  Weight: 228 lb 9.6 oz (103.7 kg)  Body mass index is 33.76 kg/m.        Physical Examination:   General appearance: Well appearing, and in no distress  Mental status: Alert, oriented to person, place, and time  Skin: Warm & dry  Respiratory: Normal respiratory effort, no distress  Abdomen: Soft, gravid, nontender  Pelvic: Cervical exam deferred          Extremities:  no edema  Psych:  mood and affect appropriate  Fetal Status:     Movement: Present   Cephalic   EFW 28%    FHR 127 bpm Posterior placenta,  AFI = 13.6 cm 50%   SVP = 4.1 cm No evidence of LV EIF,  normal 4 ch Ht,  no apparent abnormalities  Chaperone: n/a    No results found for this or any previous visit (from the past 24 hour(s)).   Assessment & Plan:  1) Low-risk pregnancy G3P2002 at [redacted]w[redacted]d with an Estimated Date of Delivery: 07/10/23   2) BV/Trich/Chlamydia -pt picking up Rx today, also discussed partner treatment  3) prior Csection and VBAC with shoulder dystocia -normal growth today - We discussed her history of c-section. Her previous c-section was due to   failure to progress- suspected CPD, 7#13 .  She has a history of  successful vaginal delivery - We discussed the risks associated with repeat c-section: bleeding, infection, injury to surrounding organs/tissues I.e. bowel/bladder, development of scar tissue, wound complications such as wound separation or infection, need for additional surgery and discussed potential for future complications such as placenta accreta spectrum  nm- We discussed the risks associated with TOLAC specifically focusing on the risk of uterine rupture. We discussed with the risk of uterine rupture that while rare it is not easily predicted, that it is a  surgical emergency, and it can be potentially catastrophic for mom and baby.  - After counseling, the patient was given the opportunity to ask questions and all questions answered.  - After considering her options, she would like to [facility] - Information provided to the patient -VBAC consent obtained   Meds: No orders of the defined types were placed in this encounter.  Labs/procedures today: growth scan  Plan:  Continue routine obstetrical care  Next visit: prefers in person    Reviewed: Term labor symptoms and general obstetric precautions including but not limited to vaginal  bleeding, contractions, leaking of fluid and fetal movement were reviewed in detail with the patient.  All questions were answered.   Follow-up: No follow-ups on file.  No orders of the defined types were placed in this encounter.   Myna Hidalgo, DO Attending Obstetrician & Gynecologist, [facility] for [employer], [facility]

## 2023-06-23 NOTE — Progress Notes (Signed)
[place] = 36+3 Single active fetus,  Cephalic   EFW 28%    FHR 127 bpm Posterior placenta,  AFI = 13.6 cm 50%   SVP = 4.1 cm No evidence of LV EIF,  normal 4 ch Ht,  no apparent abnormalities

## 2023-06-28 ENCOUNTER — Encounter: Payer: Medicaid Other | Admitting: Women's Health

## 2023-06-29 ENCOUNTER — Encounter: Payer: Self-pay | Admitting: Women's Health

## 2023-06-29 ENCOUNTER — Ambulatory Visit (INDEPENDENT_AMBULATORY_CARE_PROVIDER_SITE_OTHER): Payer: Medicaid Other | Admitting: Women's Health

## 2023-06-29 VITALS — BP 114/63 | HR 82 | Wt 234.0 lb

## 2023-06-29 DIAGNOSIS — Z3A38 38 weeks gestation of pregnancy: Secondary | ICD-10-CM | POA: Diagnosis not present

## 2023-06-29 DIAGNOSIS — Z3480 Encounter for supervision of other normal pregnancy, unspecified trimester: Secondary | ICD-10-CM

## 2023-06-29 DIAGNOSIS — O368330 Maternal care for abnormalities of the fetal heart rate or rhythm, third trimester, not applicable or unspecified: Secondary | ICD-10-CM | POA: Diagnosis not present

## 2023-06-29 DIAGNOSIS — O368390 Maternal care for abnormalities of the fetal heart rate or rhythm, unspecified trimester, not applicable or unspecified: Secondary | ICD-10-CM

## 2023-06-29 NOTE — Patient Instructions (Addendum)
Mallory Willis, thank you for choosing our office today! We appreciate the opportunity to meet your healthcare needs. You may receive a short survey by mail, e-mail, or through [employer]. If you are happy with your care we would appreciate if you could take just a few minutes to complete the survey questions. We read all of your comments and take your feedback very seriously. Thank you again for choosing our office.  Center for [employer] Team at [facility]  [facility] at [facility] ([address] [place], [place] [address]) Entrance C, located off of [address] Free 24/7 valet parking   ** CHECK YOUR BLOOD PRESSURE 2 TIMES DAILY, IF GREATER THAN 140 ON TOP OR GREATER THAN 90 ON BOTTOM LET us KNOW (BRING LOG TO NEXT APPOINTMENT). DON'T TAKE NORVASC (AMLODIPINE) ANYMORE  Call the office [phone]) or go to [facility] hospital for these signs of pre-eclampsia: Severe headache that does not go away with Tylenol Visual changes- seeing spots, double, blurred vision Pain under your right breast or upper abdomen that does not go away with Tums or heartburn medicine Nausea and/or vomiting Severe swelling in your hands, feet, and face     CLASSES: Go to [email or web address] to register for classes (childbirth, breastfeeding, waterbirth, infant CPR, daddy bootcamp, etc.)  Call the office [phone]) or go to [facility] if: You begin to have strong, frequent contractions Your water breaks.  Sometimes it is a big gush of fluid, sometimes it is just a trickle that keeps getting your panties wet or running down your legs You have vaginal bleeding.  It is normal to have a small amount of spotting if your cervix was checked.  You don't feel your baby moving like normal.  If you don't, get you something to eat and drink and lay down and focus on feeling your baby move.   If your baby is still not moving like normal, you should call the office or go to [facility].  Call the office  [phone]) or go to [facility] hospital for these signs of pre-eclampsia: Severe headache that does not go away with Tylenol Visual changes- seeing spots, double, blurred vision Pain under your right breast or upper abdomen that does not go away with Tums or heartburn medicine Nausea and/or vomiting Severe swelling in your hands, feet, and face   [facility] Pediatricians/Family Doctors [facility]): [address] Colette Ribas, [phone]           [facility]: [id number], [phone]                [facility]): [address], [phone] (call to ask if accepting patients) [facility] Department: [address], [place], [phone]    [facility] Pediatricians/Family Doctors [facility]): [address], [phone] [facility]: [address] [place], [phone] [facility]: [address], [phone]  [facility] Doctors  [facility]): [phone] [facility]: [address], [phone]   [facility] Doctors [facility]: [address] [phone]  [facility] Doctors  [facility]: [phone], [phone]  Home Blood Pressure Monitoring for Patients   Your provider has recommended that you check your blood pressure (BP) at least once a week at home. If you do not have a blood pressure cuff at home, one will be provided for you.  Contact your provider if you have not received your monitor within 1 week.   Helpful Tips for Accurate Home Blood Pressure Checks  Don't smoke, exercise, or drink caffeine 30 minutes before checking your BP Use the restroom before checking your BP (a full bladder can raise your pressure) Relax in a comfortable upright chair Feet on the ground Left arm resting comfortably on a flat surface at the level of  your heart Legs uncrossed Back supported Sit quietly and don't talk Place the cuff on your bare arm Adjust snuggly, so that only two fingertips can fit between your skin and the top of the cuff Check 2 readings separated by at least one minute Keep a log of your BP readings For a visual, please reference this diagram: http://ccnc.care/bpdiagram  Provider Name: [facility]     Phone: [phone]  Zone 1: ALL CLEAR  Continue to monitor your symptoms:  BP reading is less than 140 (top number) or less than 90 (bottom number)  No right upper stomach pain No headaches or seeing spots No feeling nauseated or throwing up No swelling in face and hands  Zone 2: CAUTION Call your doctor's office for any of the following:  BP reading is greater than 140 (top number) or greater than 90 (bottom number)  Stomach pain under your ribs in the middle or right side Headaches or seeing spots Feeling nauseated or throwing up Swelling in face and hands  Zone 3: EMERGENCY  Seek immediate medical care if you have any of the following:  BP reading is greater than160 (top number) or greater than 110 (bottom number) Severe headaches not improving with Tylenol Serious difficulty catching your breath Any worsening symptoms from Zone 2   Braxton Hicks Contractions Contractions of the uterus can occur throughout pregnancy, but they are not always a sign that you are in labor. You may have practice contractions called Braxton Hicks contractions. These false labor contractions are sometimes confused with true labor. What are Deberah Pelton contractions? Braxton Hicks contractions are tightening movements that occur in the muscles of the uterus before labor. Unlike true labor contractions, these contractions do not result in opening (dilation) and thinning of the cervix. Toward the end of pregnancy (32-34 weeks), Braxton Hicks contractions can happen more often and may become stronger. These contractions  are sometimes difficult to tell apart from true labor because they can be very uncomfortable. You should not feel embarrassed if you go to the hospital with false labor. Sometimes, the only way to tell if you are in true labor is for your health care provider to look for changes in the cervix. The health care provider will do a physical exam and may monitor your contractions. If you are not in true labor, the exam should show that your cervix is not dilating and your water has not broken. If there are no other health problems associated with your pregnancy, it is completely safe for you to be sent home with false labor. You may continue to have Braxton Hicks contractions until you go into true labor. How to tell the difference between true labor and false labor True labor Contractions last 30-70 seconds. Contractions become very regular. Discomfort is usually felt in the top of the uterus, and it spreads to the lower abdomen and low back. Contractions do not go away with walking. Contractions usually become more intense and increase in frequency. The cervix dilates and gets thinner. False labor Contractions are usually shorter and not as strong  as true labor contractions. Contractions are usually irregular. Contractions are often felt in the front of the lower abdomen and in the groin. Contractions may go away when you walk around or change positions while lying down. Contractions get weaker and are shorter-lasting as time goes on. The cervix usually does not dilate or become thin. Follow these instructions at home:  Take over-the-counter and prescription medicines only as told by your health care provider. Keep up with your usual exercises and follow other instructions from your health care provider. Eat and drink lightly if you think you are going into labor. If Braxton Hicks contractions are making you uncomfortable: Change your position from lying down or resting to walking, or change  from walking to resting. Sit and rest in a tub of warm water. Drink enough fluid to keep your urine pale yellow. Dehydration may cause these contractions. Do slow and deep breathing several times an hour. Keep all follow-up prenatal visits as told by your health care provider. This is important. Contact a health care provider if: You have a fever. You have continuous pain in your abdomen. Get help right away if: Your contractions become stronger, more regular, and closer together. You have fluid leaking or gushing from your vagina. You pass blood-tinged mucus (bloody show). You have bleeding from your vagina. You have low back pain that you never had before. You feel your baby's head pushing down and causing pelvic pressure. Your baby is not moving inside you as much as it used to. Summary Contractions that occur before labor are called Braxton Hicks contractions, false labor, or practice contractions. Braxton Hicks contractions are usually shorter, weaker, farther apart, and less regular than true labor contractions. True labor contractions usually become progressively stronger and regular, and they become more frequent. Manage discomfort from [facility] contractions by changing position, resting in a warm bath, drinking plenty of water, or practicing deep breathing. This information is not intended to replace advice given to you by your health care provider. Make sure you discuss any questions you have with your health care provider. Document Revised: 08/13/2017 Document Reviewed: 01/14/2017 Elsevier Patient Education  2020 [employer].

## 2023-06-29 NOTE — Progress Notes (Signed)
LOW-RISK PREGNANCY VISIT Patient name: Mallory Willis MRN [id number]  Date of birth: 1996-02-04 Chief Complaint:   Routine Prenatal Visit (Not finish taking flagyl or terazol cream)  History of Present Illness:   Mallory Willis is a 27 y.o. G38P2002 female at [redacted]w[redacted]d with an Estimated Date of Delivery: 07/10/23 being seen today for ongoing management of a low-risk pregnancy.   Today she reports  still finishing up flagyl and terazol. Partner hasn't gotten his meds yet . Noticed amlodipine on her med list today for 1st time, states she has been taking since after delivery of her last baby and her PCP continued. Only takes maybe 1x/wk when feels like bp may be up.  Contractions: Not present.  .  Movement: Present. denies leaking of fluid.     03/26/2023   12:05 PM 03/24/2022    9:36 AM 12/05/2021    9:13 AM 08/14/2016    1:27 PM  Depression screen PHQ 2/9  Decreased Interest 0 1 0 0  Down, Depressed, Hopeless 0 0 0 0  PHQ - 2 Score 0 1 0 0  Altered sleeping  0 0 0  Tired, decreased energy 0 1 1 0  Change in appetite 0 1 1 2   Feeling bad or failure about yourself  0 0 0 0  Trouble concentrating 0 0 0 0  Moving slowly or fidgety/restless 0 0 0 0  Suicidal thoughts 0 0 0 0  PHQ-9 Score  3 2 2         03/26/2023   12:05 PM 03/24/2022    9:36 AM 12/05/2021    9:14 AM  GAD 7 : Generalized Anxiety Score  Nervous, Anxious, on Edge 0 0 0  Control/stop worrying 0 0 0  Worry too much - different things 0 0 0  Trouble relaxing 0 1 0  Restless 0 0 0  Easily annoyed or irritable 0 1 1  Afraid - awful might happen 0 0 0  Total GAD 7 Score 0 2 1      Review of Systems:   Pertinent items are noted in HPI Denies abnormal vaginal discharge w/ itching/odor/irritation, headaches, visual changes, shortness of breath, chest pain, abdominal pain, severe nausea/vomiting, or problems with urination or bowel movements unless otherwise stated above. Pertinent History Reviewed:  Reviewed past  medical,surgical, social, obstetrical and family history.  Reviewed problem list, medications and allergies. Physical Assessment:   Vitals:   06/29/23 1450  BP: 114/63  Pulse: 82  Weight: 234 lb (106.1 kg)  Body mass index is 34.56 kg/m.        Physical Examination:   General appearance: Well appearing, and in no distress  Mental status: Alert, oriented to person, place, and time  Skin: Warm & dry  Cardiovascular: Normal heart rate noted  Respiratory: Normal respiratory effort, no distress  Abdomen: Soft, gravid, nontender  Pelvic: Cervical exam deferred         Extremities: Edema: None  Fetal Status: Fetal Heart Rate (bpm): 115 Fundal Height: 38 cm Movement: Present   Put on EFM d/t 115 via doppler and ?HTN (see note below) NST: FHR baseline 135 bpm, Variability: moderate, Accelerations:present, Decelerations:  Absent= Cat 1/reactive Toco: irregular   Chaperone: N/A   No results found for this or any previous visit (from the past 24 hour(s)).  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 States pcp kept her on it d/t elevated bp's, and she has been taking maybe 1x/wk entire pregnancy when she feels like bp may be high (just noted today). BP great today and hasn't taken amlodipine in 3d. Discussed w/ Dr. Despina Hidden, do not need to treat like CHTN. Consider IOL @ 40wks. Recommended checking bp's BID, if >140/90 let us know. Do not take anymore amlodipine. Reviewed pre-e s/s, reasons to seek care.   3) Recent +CT, trich and candida> finishing up her meds. Partner needs treatment. Azithromycin 1gm po x 1 w/ 0RF and metronidazole 2gm po x 1w/ 0RF called into [facility] for [facility], NKDA, DOB [birth year] (no ETOH w/ flagyl) and no sex until at least 7d from both finishing meds  4) Previous  C/S> wants TOLAC (consent 10/9)  5) H/O shoulder dystocia> EFW 28% @ 36.3wk   Meds: No orders of the defined types were placed in this encounter.  Labs/procedures today: NST  Plan:  Continue routine obstetrical care  Next visit: prefers in person    Reviewed: Term labor symptoms and general obstetric precautions including but not limited to vaginal bleeding, contractions, leaking of fluid and fetal movement were reviewed in detail with the patient.  All questions were answered. Does have home bp cuff. Office bp cuff given: not applicable. Check bp twice daily, let us know if consistently >140 and/or >90.  Follow-up: Return for As scheduled.  Future Appointments  Date Time Provider Department Center  07/05/2023  9:10 AM Lazaro Arms, MD [facility]  07/12/2023  9:10 AM Jacklyn Shell, CNM [facility]  07/12/2023  9:10 AM [facility] NURSE [facility]    No orders of the defined types were placed in this encounter.  Cheral Marker CNM, [facility] 06/29/2023 4:48 PM

## 2023-07-05 ENCOUNTER — Encounter: Payer: Medicaid Other | Admitting: Obstetrics & Gynecology

## 2023-07-07 ENCOUNTER — Encounter: Payer: Medicaid Other | Admitting: Women's Health

## 2023-07-10 ENCOUNTER — Inpatient Hospital Stay (HOSPITAL_COMMUNITY)
Admission: AD | Admit: 2023-07-10 | Discharge: 2023-07-13 | DRG: 807 | Disposition: A | Discharge status: Home / Self Care | Payer: Medicaid Other | Attending: Obstetrics & Gynecology | Admitting: Obstetrics & Gynecology

## 2023-07-10 DIAGNOSIS — F17210 Nicotine dependence, cigarettes, uncomplicated: Secondary | ICD-10-CM | POA: Diagnosis present

## 2023-07-10 DIAGNOSIS — O134 Gestational [pregnancy-induced] hypertension without significant proteinuria, complicating childbirth: Secondary | ICD-10-CM | POA: Diagnosis present

## 2023-07-10 DIAGNOSIS — Z3A40 40 weeks gestation of pregnancy: Secondary | ICD-10-CM

## 2023-07-10 DIAGNOSIS — O368390 Maternal care for abnormalities of the fetal heart rate or rhythm, unspecified trimester, not applicable or unspecified: Principal | ICD-10-CM

## 2023-07-10 DIAGNOSIS — Z5986 Financial insecurity: Secondary | ICD-10-CM

## 2023-07-10 DIAGNOSIS — O99334 Smoking (tobacco) complicating childbirth: Secondary | ICD-10-CM | POA: Diagnosis present

## 2023-07-10 DIAGNOSIS — A599 Trichomoniasis, unspecified: Secondary | ICD-10-CM

## 2023-07-10 DIAGNOSIS — Z98891 History of uterine scar from previous surgery: Secondary | ICD-10-CM

## 2023-07-10 DIAGNOSIS — Z3490 Encounter for supervision of normal pregnancy, unspecified, unspecified trimester: Secondary | ICD-10-CM

## 2023-07-10 DIAGNOSIS — Z79899 Other long term (current) drug therapy: Secondary | ICD-10-CM

## 2023-07-10 DIAGNOSIS — A749 Chlamydial infection, unspecified: Secondary | ICD-10-CM | POA: Diagnosis present

## 2023-07-10 DIAGNOSIS — Z8759 Personal history of other complications of pregnancy, childbirth and the puerperium: Secondary | ICD-10-CM

## 2023-07-10 DIAGNOSIS — E876 Hypokalemia: Secondary | ICD-10-CM | POA: Diagnosis present

## 2023-07-10 DIAGNOSIS — Z885 Allergy status to narcotic agent status: Secondary | ICD-10-CM

## 2023-07-10 DIAGNOSIS — O480 Post-term pregnancy: Principal | ICD-10-CM | POA: Diagnosis present

## 2023-07-10 DIAGNOSIS — O99824 Streptococcus B carrier state complicating childbirth: Secondary | ICD-10-CM | POA: Diagnosis present

## 2023-07-10 DIAGNOSIS — Z880 Allergy status to penicillin: Secondary | ICD-10-CM

## 2023-07-10 DIAGNOSIS — O139 Gestational [pregnancy-induced] hypertension without significant proteinuria, unspecified trimester: Secondary | ICD-10-CM | POA: Diagnosis present

## 2023-07-10 DIAGNOSIS — Z3480 Encounter for supervision of other normal pregnancy, unspecified trimester: Secondary | ICD-10-CM

## 2023-07-10 DIAGNOSIS — O0932 Supervision of pregnancy with insufficient antenatal care, second trimester: Secondary | ICD-10-CM

## 2023-07-10 DIAGNOSIS — O34219 Maternal care for unspecified type scar from previous cesarean delivery: Secondary | ICD-10-CM

## 2023-07-10 DIAGNOSIS — Z7982 Long term (current) use of aspirin: Secondary | ICD-10-CM

## 2023-07-10 NOTE — MAU Note (Signed)
.  Mallory Willis is a 27 y.o. at [redacted]w[redacted]d here in MAU reporting: contractions that began at 1900 this evening. Denies SROM, vaginal bleeding or bloody show. Endorses + fetal movement. Plans to TOLAC Hx C/S with first child-successful VBAC with 2nd   Onset of complaint: 1900 Pain score: 6 lower abdomen Vitals:   07/10/23 2321  BP: 132/85  Pulse: 81  Resp: 17  Temp: 97.8 F (36.6 C)  SpO2: 100%     FHT:125bpm Lab orders placed from triage:  mau labor

## 2023-07-11 ENCOUNTER — Other Ambulatory Visit: Payer: Self-pay

## 2023-07-11 ENCOUNTER — Inpatient Hospital Stay (HOSPITAL_COMMUNITY): Payer: Medicaid Other | Admitting: Anesthesiology

## 2023-07-11 ENCOUNTER — Encounter (HOSPITAL_COMMUNITY): Payer: Self-pay | Admitting: Obstetrics and Gynecology

## 2023-07-11 DIAGNOSIS — Z79899 Other long term (current) drug therapy: Secondary | ICD-10-CM | POA: Diagnosis not present

## 2023-07-11 DIAGNOSIS — O134 Gestational [pregnancy-induced] hypertension without significant proteinuria, complicating childbirth: Secondary | ICD-10-CM | POA: Diagnosis not present

## 2023-07-11 DIAGNOSIS — Z880 Allergy status to penicillin: Secondary | ICD-10-CM | POA: Diagnosis not present

## 2023-07-11 DIAGNOSIS — O34219 Maternal care for unspecified type scar from previous cesarean delivery: Secondary | ICD-10-CM | POA: Diagnosis not present

## 2023-07-11 DIAGNOSIS — O480 Post-term pregnancy: Secondary | ICD-10-CM | POA: Diagnosis not present

## 2023-07-11 DIAGNOSIS — Z885 Allergy status to narcotic agent status: Secondary | ICD-10-CM | POA: Diagnosis not present

## 2023-07-11 DIAGNOSIS — Z5986 Financial insecurity: Secondary | ICD-10-CM | POA: Diagnosis not present

## 2023-07-11 DIAGNOSIS — O99820 Streptococcus B carrier state complicating pregnancy: Secondary | ICD-10-CM | POA: Diagnosis not present

## 2023-07-11 DIAGNOSIS — Z7982 Long term (current) use of aspirin: Secondary | ICD-10-CM | POA: Diagnosis not present

## 2023-07-11 DIAGNOSIS — O99334 Smoking (tobacco) complicating childbirth: Secondary | ICD-10-CM | POA: Diagnosis not present

## 2023-07-11 DIAGNOSIS — Z3A40 40 weeks gestation of pregnancy: Secondary | ICD-10-CM | POA: Diagnosis not present

## 2023-07-11 DIAGNOSIS — F17210 Nicotine dependence, cigarettes, uncomplicated: Secondary | ICD-10-CM | POA: Diagnosis not present

## 2023-07-11 DIAGNOSIS — O99824 Streptococcus B carrier state complicating childbirth: Secondary | ICD-10-CM | POA: Diagnosis not present

## 2023-07-11 DIAGNOSIS — O09823 Supervision of pregnancy with history of in utero procedure during previous pregnancy, third trimester: Secondary | ICD-10-CM | POA: Diagnosis not present

## 2023-07-11 LAB — CREATININE, SERUM
Creatinine, Ser: 0.55 mg/dL (ref 0.44–1.00)
GFR, Estimated: >60 mL/min (ref >60)

## 2023-07-11 LAB — URINALYSIS, ROUTINE W REFLEX MICROSCOPIC
Bacteria, UA: NONE SEEN
Bilirubin Urine: NEGATIVE
Glucose, UA: NEGATIVE mg/dL
Hgb urine dipstick: NEGATIVE
Ketones, ur: NEGATIVE mg/dL
Nitrite: NEGATIVE
Protein, ur: NEGATIVE mg/dL
Specific Gravity, Urine: 1.011 (ref 1.005–1.030)
pH: 6 (ref 5.0–8.0)

## 2023-07-11 LAB — CBC
HCT: 35.3 % — ABNORMAL LOW (ref 36.0–46.0)
Hemoglobin: 11.2 g/dL — ABNORMAL LOW (ref 12.0–15.0)
MCH: 28.2 pg (ref 26.0–34.0)
MCHC: 31.7 g/dL (ref 30.0–36.0)
MCV: 88.9 fL (ref 80.0–100.0)
Platelets: 262 10*3/uL (ref 150–400)
RBC: 3.97 MIL/uL (ref 3.87–5.11)
RDW: 14.6 % (ref 11.5–15.5)
WBC: 7.8 10*3/uL (ref 4.0–10.5)
nRBC: 0 % (ref 0.0–0.2)

## 2023-07-11 LAB — COMPREHENSIVE METABOLIC PANEL
ALT: 9 U/L (ref 0–44)
AST: 17 U/L (ref 15–41)
Albumin: 2.3 g/dL — ABNORMAL LOW (ref 3.5–5.0)
Alkaline Phosphatase: 174 U/L — ABNORMAL HIGH (ref 38–126)
Anion gap: 8 (ref 5–15)
BUN: 5 mg/dL — ABNORMAL LOW (ref 6–20)
CO2: 22 mmol/L (ref 22–32)
Calcium: 8.6 mg/dL — ABNORMAL LOW (ref 8.9–10.3)
Chloride: 106 mmol/L (ref 98–111)
Creatinine, Ser: 0.52 mg/dL (ref 0.44–1.00)
GFR, Estimated: >60 mL/min (ref >60)
Glucose, Bld: 90 mg/dL (ref 70–99)
Potassium: 3.5 mmol/L (ref 3.5–5.1)
Sodium: 136 mmol/L (ref 135–145)
Total Bilirubin: 0.3 mg/dL (ref 0.3–1.2)
Total Protein: 5.3 g/dL — ABNORMAL LOW (ref 6.5–8.1)

## 2023-07-11 LAB — TYPE AND SCREEN
ABO/RH(D): O POS
Antibody Screen: NEGATIVE

## 2023-07-11 LAB — RPR: RPR Ser Ql: NONREACTIVE

## 2023-07-11 MED ORDER — OXYCODONE-ACETAMINOPHEN 5-325 MG PO TABS: 1.0000 | ORAL_TABLET | ORAL | Status: DC | PRN

## 2023-07-11 MED ORDER — LACTATED RINGERS AMNIOINFUSION: INTRAVENOUS | Status: DC

## 2023-07-11 MED ORDER — ACETAMINOPHEN 325 MG PO TABS: 650.0000 mg | ORAL_TABLET | ORAL | Status: DC | PRN

## 2023-07-11 MED ORDER — SOD CITRATE-CITRIC ACID 500-334 MG/5ML PO SOLN: 30.0000 mL | ORAL | Status: DC | PRN

## 2023-07-11 MED ORDER — ONDANSETRON HCL 4 MG/2ML IJ SOLN: 4.0000 mg | Freq: Four times a day (QID) | INTRAMUSCULAR | Status: DC | PRN

## 2023-07-11 MED ORDER — OXYTOCIN-SODIUM CHLORIDE 30-0.9 UT/500ML-% IV SOLN: 2.5000 [IU]/h | INTRAVENOUS | Status: DC

## 2023-07-11 MED ORDER — HYDRALAZINE HCL 20 MG/ML IJ SOLN: 10.0000 mg | INTRAMUSCULAR | Status: DC | PRN

## 2023-07-11 MED ORDER — FENTANYL-BUPIVACAINE-NACL 0.5-0.125-0.9 MG/250ML-% EP SOLN
12.0000 mL/h | EPIDURAL | Status: DC | PRN
  Administered 2023-07-11: 12 mL/h via EPIDURAL
  Filled 2023-07-11: qty 250

## 2023-07-11 MED ORDER — TERBUTALINE SULFATE 1 MG/ML IJ SOLN: 0.2500 mg | Freq: Once | INTRAMUSCULAR | Status: DC | PRN

## 2023-07-11 MED ORDER — IBUPROFEN 600 MG PO TABS: 600.0000 mg | ORAL_TABLET | Freq: Four times a day (QID) | ORAL | Status: DC | PRN

## 2023-07-11 MED ORDER — OXYCODONE-ACETAMINOPHEN 5-325 MG PO TABS: 2.0000 | ORAL_TABLET | ORAL | Status: DC | PRN

## 2023-07-11 MED ORDER — LABETALOL HCL 5 MG/ML IV SOLN: 80.0000 mg | INTRAVENOUS | Status: DC | PRN

## 2023-07-11 MED ORDER — LACTATED RINGERS IV SOLN: INTRAVENOUS | Status: DC

## 2023-07-11 MED ORDER — EPHEDRINE 5 MG/ML INJ: 10.0000 mg | INTRAVENOUS | Status: DC | PRN

## 2023-07-11 MED ORDER — OXYTOCIN-SODIUM CHLORIDE 30-0.9 UT/500ML-% IV SOLN
1.0000 m[IU]/min | INTRAVENOUS | Status: DC
  Administered 2023-07-11: 2 m[IU]/min via INTRAVENOUS

## 2023-07-11 MED ORDER — VANCOMYCIN HCL 10 G IV SOLR
2000.0000 mg | Freq: Three times a day (TID) | INTRAVENOUS | Status: DC
  Administered 2023-07-11 (×2): 2000 mg via INTRAVENOUS
  Filled 2023-07-11: qty 2000
  Filled 2023-07-11: qty 20
  Filled 2023-07-11: qty 2000
  Filled 2023-07-11: qty 20

## 2023-07-11 MED ORDER — LIDOCAINE-EPINEPHRINE (PF) 2 %-1:200000 IJ SOLN
INTRAMUSCULAR | Status: DC | PRN
  Administered 2023-07-11: 5 mL via EPIDURAL

## 2023-07-11 MED ORDER — DIPHENHYDRAMINE HCL 50 MG/ML IJ SOLN: 12.5000 mg | INTRAMUSCULAR | Status: DC | PRN

## 2023-07-11 MED ORDER — LABETALOL HCL 5 MG/ML IV SOLN: 20.0000 mg | INTRAVENOUS | Status: DC | PRN

## 2023-07-11 MED ORDER — DIPHENHYDRAMINE HCL 25 MG PO CAPS
25.0000 mg | ORAL_CAPSULE | Freq: Four times a day (QID) | ORAL | Status: DC | PRN
Start: 2023-07-11 — End: 2023-07-11

## 2023-07-11 MED ORDER — COCONUT OIL OIL: 1.0000 | TOPICAL_OIL | Status: DC | PRN

## 2023-07-11 MED ORDER — OXYCODONE HCL 5 MG PO TABS
5.0000 mg | ORAL_TABLET | Freq: Four times a day (QID) | ORAL | Status: DC | PRN
  Administered 2023-07-11 – 2023-07-13 (×7): 10 mg via ORAL
  Filled 2023-07-11 (×8): qty 2

## 2023-07-11 MED ORDER — LACTATED RINGERS IV SOLN
500.0000 mL | Freq: Once | INTRAVENOUS | Status: AC
  Administered 2023-07-11: 500 mL via INTRAVENOUS

## 2023-07-11 MED ORDER — FENTANYL CITRATE (PF) 100 MCG/2ML IJ SOLN
50.0000 ug | INTRAMUSCULAR | Status: DC | PRN
  Administered 2023-07-11: 100 ug via INTRAVENOUS
  Filled 2023-07-11: qty 2

## 2023-07-11 MED ORDER — LIDOCAINE HCL (PF) 1 % IJ SOLN: 30.0000 mL | INTRAMUSCULAR | Status: DC | PRN

## 2023-07-11 MED ORDER — DIPHENHYDRAMINE HCL 50 MG/ML IJ SOLN: 25.0000 mg | Freq: Four times a day (QID) | INTRAMUSCULAR | Status: DC | PRN

## 2023-07-11 MED ORDER — TETANUS-DIPHTH-ACELL PERTUSSIS 5-2.5-18.5 LF-MCG/0.5 IM SUSY: 0.5000 mL | PREFILLED_SYRINGE | Freq: Once | INTRAMUSCULAR | Status: DC

## 2023-07-11 MED ORDER — PHENYLEPHRINE 80 MCG/ML (10ML) SYRINGE FOR IV PUSH (FOR BLOOD PRESSURE SUPPORT): 80.0000 ug | PREFILLED_SYRINGE | INTRAVENOUS | Status: DC | PRN

## 2023-07-11 MED ORDER — ACETAMINOPHEN 325 MG PO TABS
650.0000 mg | ORAL_TABLET | Freq: Four times a day (QID) | ORAL | Status: DC
  Administered 2023-07-11 – 2023-07-12 (×4): 650 mg via ORAL
  Filled 2023-07-11 (×4): qty 2

## 2023-07-11 MED ORDER — NIFEDIPINE ER OSMOTIC RELEASE 30 MG PO TB24
30.0000 mg | ORAL_TABLET | Freq: Every day | ORAL | Status: DC
  Administered 2023-07-11: 30 mg via ORAL
  Filled 2023-07-11: qty 1

## 2023-07-11 MED ORDER — ONDANSETRON HCL 4 MG/2ML IJ SOLN: 4.0000 mg | INTRAMUSCULAR | Status: DC | PRN

## 2023-07-11 MED ORDER — FUROSEMIDE 20 MG PO TABS
20.0000 mg | ORAL_TABLET | Freq: Two times a day (BID) | ORAL | Status: DC
  Administered 2023-07-11 – 2023-07-13 (×4): 20 mg via ORAL
  Filled 2023-07-11 (×4): qty 1

## 2023-07-11 MED ORDER — ONDANSETRON HCL 4 MG PO TABS: 4.0000 mg | ORAL_TABLET | ORAL | Status: DC | PRN

## 2023-07-11 MED ORDER — SENNOSIDES-DOCUSATE SODIUM 8.6-50 MG PO TABS: 2.0000 | ORAL_TABLET | Freq: Every day | ORAL | Status: DC

## 2023-07-11 MED ORDER — OXYTOCIN BOLUS FROM INFUSION
333.0000 mL | Freq: Once | INTRAVENOUS | Status: AC
  Administered 2023-07-11: 333 mL via INTRAVENOUS

## 2023-07-11 MED ORDER — PRENATAL MULTIVITAMIN CH
1.0000 | ORAL_TABLET | Freq: Every day | ORAL | Status: DC
  Administered 2023-07-11 – 2023-07-12 (×2): 1 via ORAL
  Filled 2023-07-11 (×2): qty 1

## 2023-07-11 MED ORDER — LABETALOL HCL 5 MG/ML IV SOLN: 40.0000 mg | INTRAVENOUS | Status: DC | PRN

## 2023-07-11 MED ORDER — SIMETHICONE 80 MG PO CHEW: 80.0000 mg | CHEWABLE_TABLET | ORAL | Status: DC | PRN

## 2023-07-11 MED ORDER — BENZOCAINE-MENTHOL 20-0.5 % EX AERO
1.0000 | INHALATION_SPRAY | CUTANEOUS | Status: DC | PRN
  Administered 2023-07-11 – 2023-07-12 (×2): 1 via TOPICAL
  Filled 2023-07-11 (×3): qty 56

## 2023-07-11 MED ORDER — LACTATED RINGERS IV SOLN: 500.0000 mL | INTRAVENOUS | Status: DC | PRN

## 2023-07-11 MED ORDER — WITCH HAZEL-GLYCERIN EX PADS
1.0000 | MEDICATED_PAD | CUTANEOUS | Status: DC | PRN
  Administered 2023-07-11: 1 via TOPICAL

## 2023-07-11 MED ORDER — DIBUCAINE (PERIANAL) 1 % EX OINT: 1.0000 | TOPICAL_OINTMENT | CUTANEOUS | Status: DC | PRN

## 2023-07-11 NOTE — Progress Notes (Signed)
Labor Progress Note Mallory Willis is a 27 y.o. G3P2002 at [redacted]w[redacted]d presented for ctx, subsequent decision for IOL due to variable in MAU evaluation at 40 weeks, 1 day gestation.   S:  Comfortable s/p epidural.   O:  BP (!) 138/91   Pulse 72   Temp 99.1 F (37.3 C) (Oral)   Resp 16   Ht 5\' 9"  (1.753 m)   Wt 98 kg   LMP 10/03/2022   SpO2 100%   BMI 31.91 kg/m  EFM: baseline 125 bpm/ moderate variability/ 15x15 accels/ variable decels  Toco: 2-3 moderate   SVE: Dilation: 6 Effacement (%): 80 Station: -2 Presentation: Vertex Exam by:: Dr. Earlene Plater Pitocin: 10 mu/min  A/P: 27 y.o. G3P2002 [redacted]w[redacted]d   1. Labor: Progressing well in active phase of labor, s/p AROM with clear fluid. Anticipate continued progress to complete.  2. FWB: Variables noted, but with good variability and accels. Cat 2 3. Pain: Well controlled s/p epidural.  4. GBS + tx with Vanc   Anticipate NVSB.  Richardson Landry, CNM 9:01 AM

## 2023-07-11 NOTE — Progress Notes (Signed)
Called MD concerning pt's bp levels. Informed MD that pt was having elevated bp levels of 149/89, 159/90 and stating that she was having a headache. Doctor prescribed procardia 30 mg. Orders was given to call MD if pt's bp was 160/110.

## 2023-07-11 NOTE — Progress Notes (Signed)
BP (!) 144/89   Pulse 67   Temp 99.1 F (37.3 C) (Oral)   Resp 16   Ht 5\' 9"  (1.753 m)   Wt 98 kg   LMP 10/03/2022   SpO2 99%   BMI 31.91 kg/m   Dilation: 8 Effacement (%): 80, 90 Station: -1 Presentation: Vertex Exam by:: Lamont Snowball, CNM  Called to room by RN for repetitive variables. Pitocin infusion initially decreased from 10 to 5 and subsequently discontinued. IUPC placed with amnioinfusion of followed by 100/hr to relieve cord compression. FSE additionally placed due to difficulty tracing FHR. Patient placed in far left lateral lunge with peanut ball. Cervical change from 6 to 8 in less than 1 hr, FHR decels likely due to rapid cervical change and labor progress. Anticipate progression to complete and pushing, anticipate SVB. Attending OB aware of FHR tracing and plan.

## 2023-07-11 NOTE — Anesthesia Preprocedure Evaluation (Signed)
Anesthesia Evaluation  Patient identified by MRN, date of birth, ID band Patient awake    Reviewed: Allergy & Precautions, NPO status , Patient's Chart, lab work & pertinent test results  Airway Mallampati: II  TM Distance: >3 FB Neck ROM: Full    Dental no notable dental hx.    Pulmonary asthma , Current SmokerPatient did not abstain from smoking.   Pulmonary exam normal breath sounds clear to auscultation       Cardiovascular hypertension, Pt. on medications Normal cardiovascular exam Rhythm:Regular Rate:Normal     Neuro/Psych negative neurological ROS  negative psych ROS   GI/Hepatic negative GI ROS, Neg liver ROS,,,  Endo/Other  negative endocrine ROS    Renal/GU negative Renal ROS  negative genitourinary   Musculoskeletal negative musculoskeletal ROS (+)    Abdominal   Peds  Hematology negative hematology ROS (+)   Anesthesia Other Findings TOLAC  Reproductive/Obstetrics (+) Pregnancy                             Anesthesia Physical Anesthesia Plan  ASA: 3  Anesthesia Plan: Epidural   Post-op Pain Management:    Induction:   PONV Risk Score and Plan: Treatment may vary due to age or medical condition  Airway Management Planned: Natural Airway  Additional Equipment:   Intra-op Plan:   Post-operative Plan:   Informed Consent: I have reviewed the patients History and Physical, chart, labs and discussed the procedure including the risks, benefits and alternatives for the proposed anesthesia with the patient or authorized representative who has indicated his/her understanding and acceptance.       Plan Discussed with: Anesthesiologist  Anesthesia Plan Comments: (Patient identified. Risks, benefits, options discussed with patient including but not limited to bleeding, infection, nerve damage, paralysis, failed block, incomplete pain control, headache, blood pressure  changes, nausea, vomiting, reactions to medication, itching, and post partum back pain. Confirmed with bedside nurse the patient's most recent platelet count. Confirmed with the patient that they are not taking any anticoagulation, have any bleeding history or any family history of bleeding disorders. Patient expressed understanding and wishes to proceed. All questions were answered. )       Anesthesia Quick Evaluation

## 2023-07-11 NOTE — Progress Notes (Signed)
ANTIBIOTIC CONSULT NOTE  Pharmacy Consult for Vancomycin Indication: GBS Prophylaxis   Allergies  Allergen Reactions   Hydrocodone Hives   Amoxicillin Rash    Has patient had a PCN reaction causing immediate rash, facial/tongue/throat swelling, SOB or lightheadedness with hypotension: Yes Has patient had a PCN reaction causing severe rash involving mucus membranes or skin necrosis: No Has patient had a PCN reaction that required hospitalization: No Has patient had a PCN reaction occurring within the last 10 years: No If all of the above answers are "NO", then may proceed with Cephalosporin use.   Ceclor [Cefaclor] Rash   Nsaids     Pt reports hx of ulcers   Red Dye #40 (Allura Red) Rash   Ultram [Tramadol] Itching    Patient Measurements: Height: 5\' 9"  (175.3 cm) Weight: 98 kg (216 lb 0.8 oz) IBW/kg (Calculated) : 66.2 kg  Vital Signs: Temp: 99.1 F (37.3 C) (10/27 0745) Temp Source: Oral (10/27 0745) BP: 136/96 (10/27 0800) Pulse Rate: 79 (10/27 0800)  Labs: Recent Labs    07/11/23 0033 07/11/23 0159  WBC 7.8  --   HGB 11.2*  --   PLT 262  --   CREATININE  --  0.55      Microbiology: Recent Results (from the past 720 hour(s))  Culture, beta strep (group b only)     Status: Abnormal   Collection Time: 06/14/23  2:46 AM   Specimen: Vaginal/Rectal; Genital   VR  Result Value Ref Range Status   Strep Gp B Culture Positive (A) Negative Final    Comment: Centers for Disease Control and Prevention (CDC) and American Congress of Obstetricians and Gynecologists (ACOG) guidelines for prevention of perinatal group B streptococcal (GBS) disease specify co-collection of a vaginal and rectal swab specimen to maximize sensitivity of GBS detection. Per the CDC and ACOG, swabbing both the lower vagina and rectum substantially increases the yield of detection compared with sampling the vagina alone. Penicillin G, ampicillin, or cefazolin are indicated for  intrapartum prophylaxis of perinatal GBS colonization. Reflex susceptibility testing should be performed prior to use of clindamycin only on GBS isolates from penicillin-allergic women who are considered a high risk for anaphylaxis. Treatment with vancomycin without additional testing is warranted if resistance to clindamycin is noted.     Assessment: 27 y.o. female G3P2002 at [redacted]w[redacted]d GBS + with PCN allergy  Goal of Therapy:  Vancomycin Trough 15-20 mg/L  Plan:  Vancomycin 2000 mg IV every 8 hrs  Check Scr with next labs if vancomycin continued beyond 48 hours. Will check vancomycin trough level prior to 4th or 5th dose.   Natasha Bence 07/11/2023,8:19 AM

## 2023-07-11 NOTE — H&P (Signed)
LABOR AND DELIVERY ADMISSION HISTORY AND PHYSICAL NOTE  Mallory Willis is a 27 y.o. female G3P2002 with IUP at [redacted]w[redacted]d presenting for ctx. Found to have a variable deceleration on monitoring in MAU, so decision was made to admit for IOL. Pt has a hx of C/S, followed by VBAC. Pregnancy c/b hx shoulder dystocia, late start to prenatal care, hx of g/PPHTN, chlamydia and trichomonas infections during pregnancy.   Notably pt was started on amlodipine PP last pregnancy and has continued on it. However only takes it when she feels her blood pressure is high. Is not currently classified as cHTN per prenatal records.  Patient reports the fetal movement as active. Patient reports uterine contraction activity as regular, every 7 minutes. Patient reports  vaginal bleeding as none. Patient describes fluid per vagina as None.   Patient denies headache, vision changes, chest pain, shortness of breath, right upper quadrant pain, or LE edema.  She plans on breast feeding and bottle feeding feeding. Her contraception plan is:  POPs vs Depo .  Prenatal History/Complications: PNC at [facility]  Sono:  @[redacted]w[redacted]d , CWD, normal anatomy, cephalic presentation, posterior placenta, 28%ile, EFW projected 3400 g  Pregnancy complications:  Patient Active Problem List   Diagnosis Date Noted   Trichomonas infection 06/15/2023   Chlamydia 05/06/2023   Hypokalemia 05/04/2023   Late prenatal care in second trimester 03/26/2023   Encounter for supervision of normal pregnancy, antepartum 03/26/2023   History of shoulder dystocia in prior pregnancy 05/28/2022   VBAC, delivered 05/28/2022   History of gestational hypertension & PPHTN 12/05/2021   Previous cesarean section 12/05/2021    Past Medical History: Past Medical History:  Diagnosis Date   Asthma    Chronic back pain    Gonorrhea    Nausea and vomiting during pregnancy 08/14/2016   Pregnancy induced hypertension    Supervision of normal pregnancy 08/14/2016     [facility] at   7+2 weeks FOB  malick mcfarland 27 yo bm 2nd Dating By  LMP and [place]  Pap   NA GC/CT Initial:                36+wks: Genetic Screen NT/IT:  CF screen   Declined  Anatomic [place]  Flu vaccine  Tdap Recommended ~ 28wks Glucose Screen  2 hr GBS  Feed Preference  Contraception  Circumcision  Childbirth Classes  Pediatrician      Past Surgical History: Past Surgical History:  Procedure Laterality Date   CESAREAN SECTION N/A 04/10/2017   Procedure: CESAREAN SECTION;  Surgeon: Tilda Burrow, MD;  [facility];  Service: Obstetrics;  Laterality: N/A;    Obstetrical History: OB History     Gravida  3   Para  2   Term  2   Preterm      AB      Living  2      SAB      IAB      Ectopic      Multiple  0   Live Births  2           Social History: Social History   Socioeconomic History   Marital status: Significant Other    Spouse name: Not on file   Number of children: Not on file   Years of education: Not on file   Highest education level: Not on file  Occupational History   Not on file  Tobacco Use   Smoking status: Every Day  Current packs/day: 0.25    Average packs/day: 0.3 packs/day for 3.0 years (0.8 ttl pk-yrs)    Types: Cigarettes   Smokeless tobacco: Never  Vaping Use   Vaping status: Never Used  Substance and Sexual Activity   Alcohol use: No   Drug use: Yes    Types: Marijuana    Comment: a week ago   Sexual activity: Yes    Birth control/protection: None  Other Topics Concern   Not on file  Social History Narrative   Not on file   Social Determinants of Health   Financial Resource Strain: Medium Risk (03/24/2022)   Overall Financial Resource Strain (CARDIA)    Difficulty of Paying Living Expenses: Somewhat hard  Food Insecurity: No Food Insecurity (07/11/2023)   Hunger Vital Sign    Worried About Running Out of Food in the Last Year: Never true    Ran Out of Food in the Last Year: Never  true  Transportation Needs: No Transportation Needs (07/11/2023)   PRAPARE - Administrator, Civil Service (Medical): No    Lack of Transportation (Non-Medical): No  Physical Activity: Insufficiently Active (03/24/2022)   Exercise Vital Sign    Days of Exercise per Week: 4 days    Minutes of Exercise per Session: 10 min  Stress: No Stress Concern Present (03/24/2022)   [employer] of Occupational Health - Occupational Stress Questionnaire    Feeling of Stress : Not at all  Social Connections: Moderately Isolated (03/24/2022)   Social Connection and Isolation Panel [NHANES]    Frequency of Communication with Friends and Family: More than three times a week    Frequency of Social Gatherings with Friends and Family: More than three times a week    Attends Religious Services: 1 to 4 times per year    Active Member of [employer] or Organizations: No    Attends Banker Meetings: Never    Marital Status: Never married    Family History: Family History  Problem Relation Age of Onset   Other Mother        degenerative disc   Other Father        heart surgery   Cancer Maternal Grandfather     Allergies: Allergies  Allergen Reactions   Hydrocodone Hives   Amoxicillin Rash    Has patient had a PCN reaction causing immediate rash, facial/tongue/throat swelling, SOB or lightheadedness with hypotension: Yes Has patient had a PCN reaction causing severe rash involving mucus membranes or skin necrosis: No Has patient had a PCN reaction that required hospitalization: No Has patient had a PCN reaction occurring within the last 10 years: No If all of the above answers are "NO", then may proceed with Cephalosporin use.   Ceclor [Cefaclor] Rash   Nsaids     Pt reports hx of ulcers   Red Dye #40 (Allura Red) Rash   Ultram [Tramadol] Itching    Medications Prior to Admission  Medication Sig Dispense Refill Last Dose   aspirin EC 81 MG tablet Take 2 tablets (162 mg  total) by mouth daily. Swallow whole. 60 tablet 12 Past Week   ferrous gluconate (FERGON) 324 MG tablet Take 1 tablet (324 mg total) by mouth daily with breakfast. 30 tablet 3 Past Week   potassium chloride SA (KLOR-CON M) 20 MEQ tablet Take 1 tablet (20 mEq total) by mouth daily. 30 tablet 2 Past Week   Prenatal Vit-DSS-Fe Fum-FA (PRENATAL 19) 29-1 MG TABS Take 1 tablet by mouth  daily. 90 tablet PRN Past Week   acetaminophen (TYLENOL) 325 MG tablet Take 2 tablets (650 mg total) by mouth every 4 (four) hours as needed (for pain scale < 4). 30 tablet 0    albuterol (VENTOLIN HFA) 108 (90 Base) MCG/ACT inhaler Inhale 1-2 puffs into the lungs every 6 (six) hours as needed for wheezing or shortness of breath.      amLODipine (NORVASC) 5 MG tablet Take 1 tablet (5 mg total) by mouth daily. 30 tablet 3    Blood Pressure Monitor MISC For regular home bp monitoring during pregnancy 1 each 0    metroNIDAZOLE (FLAGYL) 500 MG tablet Take 1 tablet (500 mg total) by mouth 2 (two) times daily. (Patient not taking: Reported on 06/23/2023) 14 tablet 0    terconazole (TERAZOL 7) 0.4 % vaginal cream Place 1 applicator vaginally at bedtime. 45 g 0      Review of Systems  All systems reviewed and negative except as stated in HPI  Physical Exam BP 132/85   Pulse 73   Temp 97.8 F (36.6 C) (Oral)   Resp 17   Ht 5\' 9"  (1.753 m)   Wt 98.9 kg   LMP 10/03/2022   SpO2 100%   BMI 32.19 kg/m   Physical Exam Constitutional:      General: She is not in acute distress.    Appearance: She is not ill-appearing.  Cardiovascular:     Rate and Rhythm: Normal rate and regular rhythm.  Pulmonary:     Effort: Pulmonary effort is normal.  Abdominal:     Comments: Gravid  Musculoskeletal:        General: No swelling.  Skin:    General: Skin is warm and dry.  Neurological:     General: No focal deficit present.  Psychiatric:        Mood and Affect: Mood normal.   Presentation: cephalic by check  Fetal  monitoring: Baseline: 125 bpm, Variability: Good {> 6 bpm), Accelerations: Reactive, and Decelerations: Absent (One variable >1 hour prior to my assessment) Uterine activity: Irregular  Dilation: 2.5 Effacement (%): 50 Station: -3 Presentation: Vertex Exam by:: Dr Earlene Plater  Prenatal labs: ABO, Rh: --/--/O POS (10/27 0028) Antibody: NEG (10/27 0028) Rubella: 1.70 (08/19 1104) RPR: Non Reactive (08/19 0856)  HBsAg: Negative (08/19 1104)  HIV: Non Reactive (08/19 0856)  GC/Chlamydia:  Neisseria Gonorrhea  Date Value Ref Range Status  06/14/2023 Negative  Final   Chlamydia  Date Value Ref Range Status  06/14/2023 Positive (A)  Final   GBS: Positive/-- (09/30 0246)    Prenatal Transfer Tool  Maternal Diabetes: No Genetic Screening: Declined Maternal Ultrasounds/Referrals: Normal Fetal Ultrasounds or other Referrals:  None Maternal Substance Abuse:  No Significant Maternal Medications: amlodipine if pt feels her BP is high Significant Maternal Lab Results: Group B Strep positive, chlamydia, trich positive, both treated  Results for orders placed or performed during the hospital encounter of 07/10/23 (from the past 24 hour(s))  Type and screen [facility]   Collection Time: 07/11/23 12:28 AM  Result Value Ref Range   ABO/RH(D) O POS    Antibody Screen NEG    Sample Expiration      07/14/2023,2359 Performed at [facility], [address], [place], [place] [address]   Urinalysis, Routine w reflex microscopic -Urine, Clean Catch   Collection Time: 07/11/23 12:33 AM  Result Value Ref Range   Color, Urine STRAW (A) YELLOW   APPearance CLEAR CLEAR  Specific Gravity, Urine 1.011 1.005 - 1.030   pH 6.0 5.0 - 8.0   Glucose, UA NEGATIVE NEGATIVE mg/dL   Hgb urine dipstick NEGATIVE NEGATIVE   Bilirubin Urine NEGATIVE NEGATIVE   Ketones, ur NEGATIVE NEGATIVE mg/dL   Protein, ur NEGATIVE NEGATIVE mg/dL   Nitrite NEGATIVE NEGATIVE   Leukocytes,Ua TRACE (A)  NEGATIVE   RBC / HPF 0-5 0 - 5 RBC/hpf   WBC, UA 0-5 0 - 5 WBC/hpf   Bacteria, UA NONE SEEN NONE SEEN   Squamous Epithelial / HPF 0-5 0 - 5 /HPF   Mucus PRESENT   CBC   Collection Time: 07/11/23 12:33 AM  Result Value Ref Range   WBC 7.8 4.0 - 10.5 K/uL   RBC 3.97 3.87 - 5.11 MIL/uL   Hemoglobin 11.2 (L) 12.0 - 15.0 g/dL   HCT 33.2 (L) 95.1 - 88.4 %   MCV 88.9 80.0 - 100.0 fL   MCH 28.2 26.0 - 34.0 pg   MCHC 31.7 30.0 - 36.0 g/dL   RDW 16.6 06.3 - 01.6 %   Platelets 262 150 - 400 K/uL   nRBC 0.0 0.0 - 0.2 %    Assessment: Mallory Willis is a 27 y.o. G3P2002 at [redacted]w[redacted]d here for IOL for variable deceleration on monitoring. TOLAC with hx successful VBAC.  #Labor: Pitocin, AROM as able #Pain: IV pain meds PRN, epidural upon request #FHT: Category I #GBS/ID: Positive > vanc #MOF: breast feeding and bottle feeding #MOC:  POPs vs depo  #Hx g/PP HTN: Lasix after delivery. Bps wnl at admit #Chlamydia, trichomonas: Tested positive 9/30. Both treated. Will swab at admit as patient has not had TOC #Hx SD: ~50 seconds with 3030 g babe. Per delivery note, appeared to be a compound hand presentation vs a true SD. Shoulder precautions at delivery  Joanne Gavel, MD [facility] Fellow Center for [facility], [facility]  07/11/2023, 1:36 AM

## 2023-07-11 NOTE — Discharge Summary (Addendum)
Postpartum Discharge Summary     Patient Name: Mallory Willis DOB: May 24, 1996 MRN: [id number]  Date of admission: 07/10/2023 Delivery date:07/11/2023 Delivering provider: Lamont Snowball A Date of discharge: 07/13/2023  Admitting diagnosis: Antepartum variable deceleration [O36.8390] Intrauterine pregnancy: [redacted]w[redacted]d     Secondary diagnosis:  Active Problems:   Gestational hypertension   History of gestational hypertension & PPHTN   Previous cesarean section   History of shoulder dystocia in prior pregnancy   VBAC, delivered   Late prenatal care in second trimester   Encounter for supervision of normal pregnancy, antepartum   Hypokalemia   Chlamydia   Trichomonas infection  Additional problems: None    Discharge diagnosis: Term Pregnancy Delivered and Gestational Hypertension                                              Post partum procedures: none Augmentation: AROM and Pitocin Complications: None  Hospital course: Induction of Labor With Vaginal Delivery   27 y.o. yo G3P3003 at [redacted]w[redacted]d was admitted to the hospital 07/10/2023 for induction of labor.  Indication for induction: Favorable cervix at term and Gestational hypertension.  Patient had an labor course complicated by none Membrane Rupture Time/Date: 7:45 AM,07/11/2023  Delivery Method:VBAC, Spontaneous Operative Delivery:N/A Episiotomy: None Lacerations:  1st degree;Periurethral Details of delivery can be found in separate delivery note.  Patient had a postpartum course complicated by requiring Procardia XL 60mg  for BP control, as well as Lasix x 5d. Patient is discharged home 07/13/23. She expressed a desire for her son to have a circumcision prior to d/c- a consent note is already on his chart and it will be done soon.  Newborn Data: Birth date:07/11/2023 Birth time:10:36 AM Gender:Female Living status:Living Apgars:8 ,9  Weight:3110 g (6lb 13.7oz)  Magnesium Sulfate received: No BMZ received:  No Rhophylac:N/A MMR:No T-DaP:Given prenatally Flu: No RSV Vaccine received: No Transfusion:No  Immunizations received: Immunization History  Administered Date(s) Administered   Tdap 04/07/2022, 06/14/2023    Physical exam  Vitals:   07/12/23 1747 07/12/23 2024 07/12/23 2124 07/13/23 0416  BP: 123/81 137/89 130/81 133/78  Pulse: 79 75 77 84  Resp: 17 16 17 17   Temp: 98.1 F (36.7 C) 99 F (37.2 C) 99 F (37.2 C) 98 F (36.7 C)  TempSrc: Oral Oral Oral Oral  SpO2: 99% 99% 99% 100%  Weight:      Height:       General: alert and cooperative Lochia: appropriate Uterine Fundus: firm Incision: N/A DVT Evaluation: No evidence of DVT seen on physical exam. Labs: Lab Results  Component Value Date   WBC 10.0 07/12/2023   HGB 10.5 (L) 07/12/2023   HCT 32.2 (L) 07/12/2023   MCV 86.3 07/12/2023   PLT 252 07/12/2023      Latest Ref Rng & Units 07/11/2023    2:07 PM  CMP  Glucose 70 - 99 mg/dL 90   BUN 6 - 20 mg/dL 5   Creatinine 6.57 - 8.46 mg/dL 9.62   Sodium 952 - 841 mmol/L 136   Potassium 3.5 - 5.1 mmol/L 3.5   Chloride 98 - 111 mmol/L 106   CO2 22 - 32 mmol/L 22   Calcium 8.9 - 10.3 mg/dL 8.6   Total Protein 6.5 - 8.1 g/dL 5.3   Total Bilirubin 0.3 - 1.2 mg/dL 0.3   Alkaline Phos 38 - 126  U/L 174   AST 15 - 41 U/L 17   ALT 0 - 44 U/L 9    Edinburgh Score:    07/11/2023    9:27 PM  Edinburgh Postnatal Depression Scale Screening Tool  I have been able to laugh and see the funny side of things. 0  I have looked forward with enjoyment to things. 0  I have blamed myself unnecessarily when things went wrong. 0  I have been anxious or worried for no good reason. 0  I have felt scared or panicky for no good reason. 0  Things have been getting on top of me. 0  I have been so unhappy that I have had difficulty sleeping. 0  I have felt sad or miserable. 0  I have been so unhappy that I have been crying. 0  The thought of harming myself has occurred to me. 0   Edinburgh Postnatal Depression Scale Total 0   No data recorded  After visit meds:  Allergies as of 07/13/2023       Reactions   Hydrocodone Hives   Amoxil [amoxicillin] Rash   Ceclor [cefaclor] Rash   Nsaids Other (See Comments)   Pt reports hx of ulcers   Red Dye #40 (allura Red) Rash   Ultram [tramadol] Itching        Medication List     STOP taking these medications    aspirin EC 81 MG tablet   ferrous gluconate 324 MG tablet Commonly known as: FERGON   potassium chloride SA 20 MEQ tablet Commonly known as: KLOR-CON M       TAKE these medications    furosemide 20 MG tablet Commonly known as: LASIX Take 1 tablet (20 mg total) by mouth 2 (two) times daily.   ibuprofen 600 MG tablet Commonly known as: ADVIL Take 1 tablet (600 mg total) by mouth every 6 (six) hours as needed for headache.   NIFEdipine 60 MG 24 hr tablet Commonly known as: ADALAT CC Take 1 tablet (60 mg total) by mouth daily. Start taking on: July 14, 2023   PRENATAL PO Take 1 tablet by mouth daily.         Discharge home in stable condition Infant Feeding: Bottle and Breast Infant Disposition:home with mother Discharge instruction: per After Visit Summary and Postpartum booklet. Activity: Advance as tolerated. Pelvic rest for 6 weeks.  Diet: routine diet Future Appointments: Future Appointments  Date Time Provider Department Center  07/19/2023 10:50 AM [facility] NURSE [facility]  08/19/2023  1:30 PM Cheral Marker, CNM [facility]   Follow up Visit:   Please schedule this patient for a In person postpartum visit in 6 weeks with the following provider: Any provider. Additional Postpartum F/U: 2 week BP check   High risk pregnancy complicated by: HTN Delivery mode:  VBAC, Spontaneous Anticipated Birth Control:   POPs vs Depo   07/13/2023 Arabella Merles, CNM 10:33 AM

## 2023-07-11 NOTE — MAU Provider Note (Signed)
Chief Complaint:  Contractions   Event Date/Time   First Provider Initiated Contact with Patient 07/11/23 0002      HPI: Mallory Willis is a 27 y.o. G3P2002 at [redacted]w[redacted]d by LMP with hx C/S followed by VBAC with mild shoulder dystocia who presents to maternity admissions for labor evaluation with contractions every 10 minutes. She desires TOLAC with this pregnancy. She reports good fetal movement, denies LOF, vaginal bleeding, vaginal itching/burning, urinary symptoms, h/a, dizziness, n/v, or fever/chills.    HPI  Past Medical History: Past Medical History:  Diagnosis Date   Asthma    Chronic back pain    Gonorrhea    Nausea and vomiting during pregnancy 08/14/2016   Pregnancy induced hypertension    Supervision of normal pregnancy 08/14/2016    [facility] at   7+2 weeks FOB  malick mcfarland 27 yo bm 2nd Dating By  LMP and [place]  Pap   NA GC/CT Initial:                36+wks: Genetic Screen NT/IT:  CF screen   Declined  Anatomic [place]  Flu vaccine  Tdap Recommended ~ 28wks Glucose Screen  2 hr GBS  Feed Preference  Contraception  Circumcision  Childbirth Classes  Pediatrician      Past obstetric history: OB History  Gravida Para Term Preterm AB Living  3 2 2     2   SAB IAB Ectopic Multiple Live Births        0 2    # Outcome Date GA Lbr Len/2nd Weight Sex Type Anes PTL Lv  3 Current           2 Term 05/28/22 [redacted]w[redacted]d 17:08 / 00:12 3030 g F VBAC EPI  LIV  1 Term 04/10/17 [redacted]w[redacted]d  3565 g F CS-LTranv EPI  LIV     Complications: Failure to Progress in First Stage, Gestational hypertension    Past Surgical History: Past Surgical History:  Procedure Laterality Date   CESAREAN SECTION N/A 04/10/2017   Procedure: CESAREAN SECTION;  Surgeon: Tilda Burrow, MD;  Location: [facility] BIRTHING SUITES;  Service: Obstetrics;  Laterality: N/A;    Family History: Family History  Problem Relation Age of Onset   Other Mother        degenerative disc   Other Father        heart surgery    Cancer Maternal Grandfather     Social History: Social History   Tobacco Use   Smoking status: Every Day    Current packs/day: 0.25    Average packs/day: 0.3 packs/day for 3.0 years (0.8 ttl pk-yrs)    Types: Cigarettes   Smokeless tobacco: Never  Vaping Use   Vaping status: Never Used  Substance Use Topics   Alcohol use: No   Drug use: Yes    Types: Marijuana    Comment: a week ago    Allergies:  Allergies  Allergen Reactions   Hydrocodone Hives   Amoxicillin Rash    Has patient had a PCN reaction causing immediate rash, facial/tongue/throat swelling, SOB or lightheadedness with hypotension: Yes Has patient had a PCN reaction causing severe rash involving mucus membranes or skin necrosis: No Has patient had a PCN reaction that required hospitalization: No Has patient had a PCN reaction occurring within the last 10 years: No If all of the above answers are "NO", then may proceed with Cephalosporin use.   Ceclor [Cefaclor] Rash   Nsaids     Pt  reports hx of ulcers   Red Dye #40 (Allura Red) Rash   Ultram [Tramadol] Itching    Meds:  Medications Prior to Admission  Medication Sig Dispense Refill Last Dose   acetaminophen (TYLENOL) 325 MG tablet Take 2 tablets (650 mg total) by mouth every 4 (four) hours as needed (for pain scale < 4). 30 tablet 0    albuterol (VENTOLIN HFA) 108 (90 Base) MCG/ACT inhaler Inhale 1-2 puffs into the lungs every 6 (six) hours as needed for wheezing or shortness of breath.      amLODipine (NORVASC) 5 MG tablet Take 1 tablet (5 mg total) by mouth daily. 30 tablet 3    aspirin EC 81 MG tablet Take 2 tablets (162 mg total) by mouth daily. Swallow whole. 60 tablet 12    Blood Pressure Monitor MISC For regular home bp monitoring during pregnancy 1 each 0    ferrous gluconate (FERGON) 324 MG tablet Take 1 tablet (324 mg total) by mouth daily with breakfast. 30 tablet 3    metroNIDAZOLE (FLAGYL) 500 MG tablet Take 1 tablet (500 mg total) by mouth 2  (two) times daily. (Patient not taking: Reported on 06/23/2023) 14 tablet 0    potassium chloride SA (KLOR-CON M) 20 MEQ tablet Take 1 tablet (20 mEq total) by mouth daily. (Patient not taking: Reported on 06/14/2023) 30 tablet 2    Prenatal Vit-DSS-Fe Fum-FA (PRENATAL 19) 29-1 MG TABS Take 1 tablet by mouth daily. 90 tablet PRN    terconazole (TERAZOL 7) 0.4 % vaginal cream Place 1 applicator vaginally at bedtime. 45 g 0     ROS:  Review of Systems  Constitutional:  Negative for chills, fatigue and fever.  Eyes:  Negative for visual disturbance.  Respiratory:  Negative for shortness of breath.   Cardiovascular:  Negative for chest pain.  Gastrointestinal:  Positive for abdominal pain. Negative for nausea and vomiting.  Genitourinary:  Negative for difficulty urinating, dysuria, flank pain, pelvic pain, vaginal bleeding, vaginal discharge and vaginal pain.  Musculoskeletal:  Positive for back pain.  Neurological:  Negative for dizziness and headaches.  Psychiatric/Behavioral: Negative.       I have reviewed patient's Past Medical Hx, Surgical Hx, Family Hx, Social Hx, medications and allergies.   Physical Exam  Patient Vitals for the past 24 hrs:  BP Temp Temp src Pulse Resp SpO2 Height Weight  07/10/23 2322 132/85 -- -- 73 -- -- -- --  07/10/23 2321 132/85 97.8 F (36.6 C) Oral 81 17 100 % 5\' 9"  (1.753 m) 98.9 kg   Constitutional: Well-developed, well-nourished female in no acute distress.  Cardiovascular: normal rate Respiratory: normal effort GI: Abd soft, non-tender, gravid appropriate for gestational age.  MS: Extremities nontender, no edema, normal ROM Neurologic: Alert and oriented x 4.  GU: Neg CVAT.  PELVIC EXAM:  Dilation: Closed Effacement (%): Thick Station: -3  FHT:  Baseline 120 , moderate variability, accelerations present, variable deceleration x 1 Contractions: irregular, mild to palpation   Labs: No results found for this or any previous visit (from the  past 24 hour(s)).    Imaging:   MAU Course/MDM: Orders Placed This Encounter  Procedures   Contraction - monitoring   External fetal heart monitoring   Vaginal exam    No orders of the defined types were placed in this encounter.    NST reviewed, variable deceleration x 1 so nonreactive No evidence of active labor with irregular contractions and cervix closed by RN exam (exam in office  on 06/14/23 was 1/thick/-3)  Consult Dr Berton Lan with presentation and FHR tracing Admit to L&D for IOL/augmentation of early labor for nonreassuring fetal surveillance with postterm pregnancy    Assessment:  1. Antepartum variable deceleration   2. History of gestational hypertension & PPHTN   3. Previous cesarean section   4. History of shoulder dystocia in prior pregnancy   5. VBAC, delivered   6. Late prenatal care in second trimester   7. Supervision of other normal pregnancy, antepartum   8. Chlamydia   9. Trichomonas infection     Plan: Discharge home Labor precautions and fetal kick counts    Sharen Counter Certified Nurse-Midwife 07/11/2023 12:18 AM

## 2023-07-11 NOTE — Anesthesia Procedure Notes (Signed)
Epidural Patient location during procedure: OB Start time: 07/11/2023 4:45 AM End time: 07/11/2023 4:55 AM  Staffing Anesthesiologist: Elmer Picker, MD Performed: anesthesiologist   Preanesthetic Checklist Completed: patient identified, IV checked, risks and benefits discussed, monitors and equipment checked, pre-op evaluation and timeout performed  Epidural Patient position: sitting Prep: DuraPrep and site prepped and draped Patient monitoring: continuous pulse ox, blood pressure, heart rate and cardiac monitor Approach: midline Location: L3-L4 Injection technique: LOR air  Needle:  Needle type: Tuohy  Needle gauge: 17 G Needle length: 9 cm Needle insertion depth: 6 cm Catheter type: closed end flexible Catheter size: 19 Gauge Catheter at skin depth: 11 cm Test dose: negative  Assessment Sensory level: T8 Events: blood not aspirated, no cerebrospinal fluid, injection not painful, no injection resistance, no paresthesia and negative IV test  Additional Notes Patient identified. Risks/Benefits/Options discussed with patient including but not limited to bleeding, infection, nerve damage, paralysis, failed block, incomplete pain control, headache, blood pressure changes, nausea, vomiting, reactions to medication both or allergic, itching and postpartum back pain. Confirmed with bedside nurse the patient's most recent platelet count. Confirmed with patient that they are not currently taking any anticoagulation, have any bleeding history or any family history of bleeding disorders. Patient expressed understanding and wished to proceed. All questions were answered. Sterile technique was used throughout the entire procedure. Please see nursing notes for vital signs. Test dose was given through epidural catheter and negative prior to continuing to dose epidural or start infusion. Warning signs of high block given to the patient including shortness of breath, tingling/numbness in  hands, complete motor block, or any concerning symptoms with instructions to call for help. Patient was given instructions on fall risk and not to get out of bed. All questions and concerns addressed with instructions to call with any issues or inadequate analgesia.  Reason for block:procedure for pain

## 2023-07-11 NOTE — Lactation Note (Signed)
This note was copied from a baby's chart. Lactation Consultation Note  Patient Name: Mallory Willis [id number] Date: 07/11/2023 Age:27 years Reason for consult: Initial assessment;Term (DAT+)  P3- MOB's feeding plan is to do both breast and bottle. MOB stated that infant has not eaten since birth due to being spitty. MOB asked LC to assist with formula feeding infant. LC used pace feeding with a white Nfant nipple to get infant to eat 9 mL in 20 minutes. Infant was spitting up and tongue thrusting. LC had to place a finger on the side of infant's cheek and one under infant's  chin to assist him with nutritive sucking. LC reassured MOB that once infant gets the fluid out of his belly, he will start eating a little better.  LC reviewed pace feeding, feeding infant on cue 8-12x in 24 hrs, not allowing infant to go over 3 hrs without a feeding, CDC milk storage guidelines and LC services handout. MOB does not have a pump at the moment and requested LC to send a referral to the [facility]. LC encouraged MOB to call for further assistance as needed.  Maternal Data Has patient been taught Hand Expression?: No Does the patient have breastfeeding experience prior to this delivery?: Yes How long did the patient breastfeed?: 3 months with one child and 5 months with the other child  Feeding Mother's Current Feeding Choice: Breast Milk and Formula Nipple Type: Nfant Standard Flow (white)  Lactation Tools Discussed/Used Pump Education: Milk Storage  Interventions Interventions: Breast feeding basics reviewed;Education;Pace feeding;LC Services brochure  Discharge Discharge Education: Warning signs for feeding baby WIC Program: Yes  Consult Status Consult Status: Follow-up Date: 07/12/23 Follow-up type: In-patient    Dema Severin BS, IBCLC 07/11/2023, 6:42 PM

## 2023-07-11 NOTE — Progress Notes (Signed)
LABOR PROGRESS NOTE  Patient Name: Mallory Willis, female   DOB: 04/08/1996, 27 y.o.  MRN: [id number]  [id number] at [redacted]w[redacted]d admitted for variable decel  S: Feeling comfortable s/p epidural  O:  BP 124/71   Pulse 71   Temp 97.8 F (36.6 C) (Oral)   Resp 16   Ht 5\' 9"  (1.753 m)   Wt 98 kg   LMP 10/03/2022   SpO2 100%   BMI 31.91 kg/m  EFM:130 bpm/Moderate variability/ 15x15 accels/ Variable decels CAT: 2 Toco: regular, every 2-3 minutes   CVE: Dilation: 6 Effacement (%): 80 Station: -2 Presentation: Vertex Exam by:: Dr. Earlene Plater   A&P:   #Labor: Progressing well. Risks/benefits of AROM discussed with patient, and verbal consent obtained. Obtained small amount of clear fluid. Mom and babe tolerated well. Continue pit #Pain: Epidural #FWB: CAT 2 - reassuring variability and accels #GBS positive > vanc #Anticipate vaginal delivery  Joanne Gavel, MD FMOB Fellow, Faculty practice [facility], Center for [facility] 07/11/23  7:52 AM

## 2023-07-12 ENCOUNTER — Other Ambulatory Visit: Payer: Medicaid Other

## 2023-07-12 ENCOUNTER — Encounter: Payer: Medicaid Other | Admitting: Advanced Practice Midwife

## 2023-07-12 LAB — GC/CHLAMYDIA PROBE AMP (~~LOC~~) NOT AT ARMC
Chlamydia: NEGATIVE
Comment: NEGATIVE
Comment: NORMAL
Neisseria Gonorrhea: NEGATIVE

## 2023-07-12 LAB — CBC
HCT: 32.2 % — ABNORMAL LOW (ref 36.0–46.0)
Hemoglobin: 10.5 g/dL — ABNORMAL LOW (ref 12.0–15.0)
MCH: 28.2 pg (ref 26.0–34.0)
MCHC: 32.6 g/dL (ref 30.0–36.0)
MCV: 86.3 fL (ref 80.0–100.0)
Platelets: 252 10*3/uL (ref 150–400)
RBC: 3.73 MIL/uL — ABNORMAL LOW (ref 3.87–5.11)
RDW: 14.6 % (ref 11.5–15.5)
WBC: 10 10*3/uL (ref 4.0–10.5)
nRBC: 0 % (ref 0.0–0.2)

## 2023-07-12 MED ORDER — NIFEDIPINE ER OSMOTIC RELEASE 30 MG PO TB24
60.0000 mg | ORAL_TABLET | Freq: Every day | ORAL | Status: DC
Start: 2023-07-12 — End: 2023-07-13
  Administered 2023-07-12 – 2023-07-13 (×2): 60 mg via ORAL
  Filled 2023-07-12 (×2): qty 2

## 2023-07-12 MED ORDER — CYCLOBENZAPRINE HCL 10 MG PO TABS
10.0000 mg | ORAL_TABLET | Freq: Once | ORAL | Status: AC
  Administered 2023-07-12: 10 mg via ORAL
  Filled 2023-07-12: qty 1

## 2023-07-12 MED ORDER — ACETAMINOPHEN 325 MG PO TABS
650.0000 mg | ORAL_TABLET | ORAL | Status: DC
  Administered 2023-07-12 – 2023-07-13 (×5): 650 mg via ORAL
  Filled 2023-07-12 (×5): qty 2

## 2023-07-12 NOTE — Anesthesia Postprocedure Evaluation (Signed)
Anesthesia Post Note  Patient: Mallory Willis  Procedure(s) Performed: AN AD HOC LABOR EPIDURAL     Patient location during evaluation: Mother Baby Anesthesia Type: Epidural Level of consciousness: awake and alert and oriented Pain management: satisfactory to patient Vital Signs Assessment: post-procedure vital signs reviewed and stable Respiratory status: respiratory function stable Cardiovascular status: stable Postop Assessment: no headache, no backache, epidural receding, patient able to bend at knees, no signs of nausea or vomiting, adequate PO intake and able to ambulate Anesthetic complications: no   No notable events documented.  Last Vitals:  Vitals:   07/12/23 0008 07/12/23 0638  BP: (!) 139/96 136/84  Pulse: 68 66  Resp:    Temp: 36.9 C 36.8 C  SpO2: 100% 99%    Last Pain:  Vitals:   07/12/23 0907  TempSrc:   PainSc: 7    Pain Goal:                   Karleen Dolphin

## 2023-07-12 NOTE — Progress Notes (Signed)
POSTPARTUM PROGRESS NOTE  Post Partum Day 1  Subjective:  Mallory Willis is a 27 y.o. G3P3003 s/p SVD at [redacted]w[redacted]d.  She reports she is doing well. No acute events overnight. She denies any problems with ambulating, voiding or po intake. Denies nausea or vomiting.  Pain is moderately controlled, patient refusing motrin due to hx of GI intolerance.  Lochia is appropriate.  Objective: Blood pressure 136/84, pulse 66, temperature 98.2 F (36.8 C), temperature source Oral, resp. rate 16, height 5\' 9"  (1.753 m), weight 98 kg, last menstrual period 10/03/2022, SpO2 99%, unknown if currently breastfeeding.  Physical Exam:  General: alert, cooperative and no distress Chest: no respiratory distress Heart:regular rate, distal pulses intact Uterine Fundus: firm, appropriately tender DVT Evaluation: No calf swelling or tenderness Extremities: trace edema Skin: warm, dry  Recent Labs    07/11/23 0033 07/12/23 0459  HGB 11.2* 10.5*  HCT 35.3* 32.2*    Assessment/Plan: Mallory Willis is a 27 y.o. G3P3003 s/p SVD at [redacted]w[redacted]d   PPD#1 - Doing well  Routine postpartum care  Contraception: undecided POPs vs Depo Feeding: Bottle and breast Dispo: Plan for discharge later 10/28 or 10/29, once pain controlled.   LOS: 1 day   Wyn Forster, MD OB Fellow  07/12/2023, 7:26 AM

## 2023-07-12 NOTE — Lactation Note (Signed)
This note was copied from a baby's chart. Lactation Consultation Note  Patient Name: Mallory Willis [id number] Date: 07/12/2023 Age:26 hours Reason for consult: Follow-up assessment;Term (weight loss -3.57%).  Per MOB, infant had a lot of emesis 1st day of life and had 3 episodes today, MOB would like latch assistance, time for a feeding, LC gave support pillows, assisted with latching infant at the breast, infant latched on MOB's left breast using the football hold position, infant BF for 15 minutes, afterwards infant was being supplement with formula had take 13 mls when LC left the room, FOB was pace feeding infant. LC set MOB up with DEBP at her request with 21 mm breast flange, MOB had expressed 8 mls and was still pumping when LC left the room. MOB plans to latch infant 1st at the next feeding then afterwards supplement infant with her EBM first before offering formula. MOB goal is to breastfeed infant. WIC referral for DEBP was sent earlier by [facility].  MOB knows that her EBM is safe for 4 hours at room temperature whereas formula once opened must be used within 1 hour.  Today's current feeding plan: 1- Continue to latch infant 1st for every feeding, BF by cues, every 2-3 hours, skin to skin with pillow support. 2- Ask latch assistance if needed. 3- Continue to use DEBP every 3 hours for 15 minutes on initial setting, MOB will offer her EBM first before supplement infant with formula. Maternal Data    Feeding Mother's Current Feeding Choice: Breast Milk and Formula Nipple Type: Nfant Standard Flow (white)  LATCH Score Latch: Grasps breast easily, tongue down, lips flanged, rhythmical sucking.  Audible Swallowing: Spontaneous and intermittent  Type of Nipple: Everted at rest and after stimulation  Comfort (Breast/Nipple): Soft / non-tender  Hold (Positioning): Assistance needed to correctly position infant at breast and maintain latch.  LATCH Score: 9   Lactation Tools  Discussed/Used Tools: Pump;Flanges Flange Size: 21 Breast pump type: Double-Electric Breast Pump Pump Education: Setup, frequency, and cleaning;Milk Storage Reason for Pumping: Mom's request, infant previously having alot of emesis and was not latching well at the breast. Pumping frequency: MOB will continue to use DEBP every 3 hours for 15 minutes on inital setting. Pumped volume: 8 mL (Still pumping when LC left the room.)  Interventions Interventions: Skin to skin;Assisted with latch;Breast compression;Adjust position;Support pillows;Position options;DEBP;Education;Pace feeding;Guidelines for Milk Supply and Pumping Schedule Handout;CDC Guidelines for Breast Pump Cleaning  Discharge    Consult Status Consult Status: Follow-up Date: 07/13/23 Follow-up type: In-patient    Frederico Hamman 07/12/2023, 8:01 PM

## 2023-07-13 ENCOUNTER — Other Ambulatory Visit (HOSPITAL_COMMUNITY): Payer: Self-pay

## 2023-07-13 MED ORDER — FUROSEMIDE 20 MG PO TABS
20.0000 mg | ORAL_TABLET | Freq: Two times a day (BID) | ORAL | 0 refills | Status: DC
  Filled 2023-07-13: qty 2, 1d supply, fill #0

## 2023-07-13 MED ORDER — IBUPROFEN 600 MG PO TABS
600.0000 mg | ORAL_TABLET | Freq: Four times a day (QID) | ORAL | 0 refills | Status: DC | PRN
  Filled 2023-07-13: qty 30, 8d supply, fill #0

## 2023-07-13 MED ORDER — NIFEDIPINE ER 60 MG PO TB24
60.0000 mg | ORAL_TABLET | Freq: Every day | ORAL | 3 refills | Status: DC
  Filled 2023-07-13: qty 30, 30d supply, fill #0

## 2023-07-13 NOTE — Clinical Social Work Maternal (Signed)
CLINICAL SOCIAL WORK MATERNAL/CHILD NOTE  Patient Details  Name: Mallory Willis MRN: [id number] Date of Birth: 27-Oct-1995  Date:  07/13/2023  Clinical Social Worker Initiating Note:  Enos Fling Date/Time: Initiated:  07/13/23/1200     Child's Name:  Mallory Willis   Biological Parents:  Mother, Father Mallory Willis November 26, 1995 [facility] 06-14-1995)   Need for Interpreter:  None   Reason for Referral:  Current Substance Use/Substance Use During Pregnancy     Address:  [address] [place] [address]    Phone number:  [phone] (home)     Additional phone number:   Household Members/Support Persons (HM/SP):   Household Member/Support Person 1, Household Member/Support Person 2, Household Member/Support Person 3   HM/SP Name Relationship DOB or Age  HM/SP -1 Mallory Willis [place] 06-14-1996  HM/SP -2 Mallory Willis MOB's mom 04-10-2017  HM/SP -3 Mallory Willis MOB's mom 05-28-2022  HM/SP -4        HM/SP -5        HM/SP -6        HM/SP -7        HM/SP -8          Natural Supports (not living in the home):  Immediate Family, Spouse/significant other   Professional Supports: None   Employment: Unemployed   Type of Work:     Education:  9 to 11 years   Homebound arranged:    Surveyor, quantity Resources:  [employer]   Other Resources:  [employer], Sales executive     Cultural/Religious Considerations Which May Impact Care:    Strengths:  Ability to meet basic needs  , Merchandiser, retail, Home prepared for child     Psychotropic Medications:         Pediatrician:    [place]  Pediatrician List:   [facility] [place]  ([facility])  [facility]      Pediatrician Fax Number:    Risk Factors/Current Problems:  Substance Use     Cognitive State:  Able to Concentrate  , Alert  , Insightful  , Goal Oriented  , Linear Thinking     Mood/Affect:  Interested   , Comfortable  , Calm  , Relaxed     CSW Assessment: CSW received a consult for THC use during pregnancy, and met MOB at bedside to complete a full psychosocial assessment. CSW entered the room, introduced herself and explained the reason for the visit. MOB was polite, easy to engage, receptive to meeting with CSW, and appeared forthcoming.  CSW collected MOB's demographic information and inquired about her mental health history. MOB denied any mental health history. CSW provided education regarding the baby blues period vs. perinatal mood disorders, discussed treatment and gave resources for mental health follow up if concerns arise.  CSW recommends self-evaluation during the postpartum time period using the New Mom Checklist from Postpartum Progress and encouraged MOB to contact a medical professional if symptoms are noted at any time. CSW assessed for safety with MOB SI/HI/DV;MOB denied all.   CSW asked MOB does she receive support resources; MOB said yes(WIC and food stamps). MOB reported having all essential items for the infant including a carseat, bassinet and crib for safe sleeping. CSW provided review of Sudden Infant Death Syndrome (SIDS) precautions.    CSW informed MOB due to [facility] during her pregnancy; the hospital will perform a UDS and  CDS on the infant. If the screenings return with positive results a report to CPS will be made; MOB was understanding. MOB reported using THC during the first 6 months of her pregnancy; due to feeling sick. MOB reported prior to her pregnancy she used THC every 3-4 days and denied the use of any illicit substances.     CSW Plan/Description:   No Further Intervention Required/No Barriers to Discharge, Sudden Infant Death Syndrome (SIDS) Education, Perinatal Mood and Anxiety Disorder (PMADs) Education, Hospital Drug Screen Policy Information, Other Information/Referral to [facility], CSW Will Continue to Monitor Umbilical Cord Tissue Drug Screen  Results and Make Report if Joanie Coddington, LCSW 07/13/2023, 12:16 PM

## 2023-07-13 NOTE — Lactation Note (Signed)
This note was copied from a baby's chart. Lactation Consultation Note  Patient Name: Mallory Willis [id number] Date: 07/13/2023 Age:26 hours  Reason for consult: Follow-up assessment;Term  P3, [redacted]w[redacted]d, 5% weight loss  Follow up visit with P3 mother. She is breast and formula feeding per choice. Mother reports she is breastfeeding between bottle feedings. Mother has experience with breastfeeding for 4 moths with other children. She has experience engorgement. Reviewed prevention and management of engorgement. Submitted a request for a stork pump.    Discussed the process of milk production, "supply and demand" and the importance of breast stimulation and milk removal in order to make an optimal milk supply.  Discussed with mother to breastfeed 8-12 times in 24 hours, skin to skin and breast feed before formula feeding.  If missed feedings at breast or substituting feeding with formula, advised to hand express and/or pump to remove milk from the breast.    Feeding Mother's Current Feeding Choice: Breast Milk and Formula   Discharge Discharge Education: Engorgement and breast care;Warning signs for feeding baby Pump: Stork Pump  Consult Status Consult Status: Complete Date: 07/13/23    Omar Person 07/13/2023, 11:55 AM

## 2023-07-16 ENCOUNTER — Encounter: Payer: Self-pay | Admitting: Family Medicine

## 2023-07-19 ENCOUNTER — Ambulatory Visit (INDEPENDENT_AMBULATORY_CARE_PROVIDER_SITE_OTHER): Payer: Medicaid Other

## 2023-07-19 VITALS — BP 124/77 | HR 88 | Ht 69.0 in | Wt 217.0 lb

## 2023-07-19 DIAGNOSIS — Z8759 Personal history of other complications of pregnancy, childbirth and the puerperium: Secondary | ICD-10-CM

## 2023-07-19 DIAGNOSIS — Z0130 Encounter for examination of blood pressure without abnormal findings: Secondary | ICD-10-CM

## 2023-07-19 NOTE — Progress Notes (Signed)
   NURSE VISIT- BLOOD PRESSURE CHECK  SUBJECTIVE:  Mallory Willis is a 27 y.o. [phone] female here for BP check. She is postpartum, delivery date 10 /27/24 HYPERTENSION ROS:  Pregnant/postpartum:  Severe headaches that don't go away with tylenol/other medicines: No  Visual changes (seeing spots/double/blurred vision) No  Severe pain under right breast breast or in center of upper chest No  Severe nausea/vomiting No  Taking medicines as instructed yes   OBJECTIVE:  BP 124/77 (BP Location: Right Arm, Patient Position: Sitting, Cuff Size: Normal)   Pulse 88   Ht 5\' 9"  (1.753 m)   Wt 217 lb (98.4 kg)   LMP 10/03/2022   Breastfeeding Yes   BMI 32.05 kg/m   Appearance alert, well appearing, and in no distress.  ASSESSMENT: Postpartum  blood pressure check  PLAN: Discussed with Joellyn Haff, CNM, [facility]   Recommendations: stop medicine 2 days before next visit   Follow-up: as scheduled 08/19/23 Post partum appointment   Betzabe Bevans R Felicia Both  07/19/2023 12:15 PM

## 2023-07-26 ENCOUNTER — Encounter: Payer: Self-pay | Admitting: *Deleted

## 2023-08-19 ENCOUNTER — Ambulatory Visit: Payer: Medicaid Other | Admitting: Women's Health

## 2023-08-25 ENCOUNTER — Ambulatory Visit: Payer: Medicaid Other | Admitting: Advanced Practice Midwife

## 2023-08-26 ENCOUNTER — Telehealth: Payer: Self-pay | Admitting: Women's Health

## 2023-08-26 NOTE — Telephone Encounter (Signed)
Spoke with patient she reports headache since yesterday. Has tried tylenol but hasn't helped much. Her BP is 153/89. Discussed with Dr. Charlotta Newton patient is to try taking ibuprofen and tylenol together and drinking plenty of water. Also will take another dose of nifedipine. If sx worsen patient aware to go to [facility].

## 2023-08-26 NOTE — Telephone Encounter (Signed)
Patient called rescheduled postpartum visit that she missed yesterday, wants someone to call her states that she has not stop bleeding since she has been home from hospital and has a headache .

## 2023-09-01 ENCOUNTER — Ambulatory Visit: Payer: Medicaid Other | Admitting: Advanced Practice Midwife

## 2023-09-02 DIAGNOSIS — K047 Periapical abscess without sinus: Secondary | ICD-10-CM | POA: Diagnosis not present

## 2024-01-27 ENCOUNTER — Emergency Department
Admission: EM | Admit: 2024-01-27 | Discharge: 2024-01-27 | Disposition: A | Discharge status: Home / Self Care | Attending: Emergency Medicine | Admitting: Emergency Medicine

## 2024-01-27 ENCOUNTER — Other Ambulatory Visit: Payer: Self-pay

## 2024-01-27 ENCOUNTER — Encounter: Payer: Self-pay | Admitting: Emergency Medicine

## 2024-01-27 DIAGNOSIS — I10 Essential (primary) hypertension: Secondary | ICD-10-CM | POA: Insufficient documentation

## 2024-01-27 DIAGNOSIS — M545 Low back pain, unspecified: Secondary | ICD-10-CM | POA: Diagnosis present

## 2024-01-27 DIAGNOSIS — J45909 Unspecified asthma, uncomplicated: Secondary | ICD-10-CM | POA: Diagnosis not present

## 2024-01-27 DIAGNOSIS — M5459 Other low back pain: Secondary | ICD-10-CM | POA: Diagnosis not present

## 2024-01-27 DIAGNOSIS — N390 Urinary tract infection, site not specified: Secondary | ICD-10-CM | POA: Diagnosis not present

## 2024-01-27 LAB — URINALYSIS, ROUTINE W REFLEX MICROSCOPIC
Bilirubin Urine: NEGATIVE
Glucose, UA: NEGATIVE mg/dL
Hgb urine dipstick: NEGATIVE
Ketones, ur: NEGATIVE mg/dL
Nitrite: NEGATIVE
Protein, ur: 30 mg/dL — AB
Specific Gravity, Urine: 1.027 (ref 1.005–1.030)
pH: 8 (ref 5.0–8.0)

## 2024-01-27 LAB — POC URINE PREG, ED: Preg Test, Ur: NEGATIVE

## 2024-01-27 MED ORDER — KETOROLAC TROMETHAMINE 15 MG/ML IJ SOLN
15.0000 mg | Freq: Once | INTRAMUSCULAR | Status: AC
  Administered 2024-01-27: 15 mg via INTRAVENOUS
  Filled 2024-01-27: qty 1

## 2024-01-27 MED ORDER — SULFAMETHOXAZOLE-TRIMETHOPRIM 800-160 MG PO TABS
1.0000 | ORAL_TABLET | Freq: Two times a day (BID) | ORAL | 0 refills | Status: AC
Start: 2024-01-27 — End: ?

## 2024-01-27 MED ORDER — SULFAMETHOXAZOLE-TRIMETHOPRIM 800-160 MG PO TABS: 1.0000 | ORAL_TABLET | Freq: Two times a day (BID) | ORAL | 0 refills | Status: DC

## 2024-01-27 MED ORDER — CELECOXIB 100 MG PO CAPS: 100.0000 mg | ORAL_CAPSULE | Freq: Every day | ORAL | 0 refills | Status: AC

## 2024-01-27 NOTE — Discharge Instructions (Signed)
 Follow-up with your primary care provider if any continued problems or concerns.  Prescription was sent to the pharmacy for antibiotics and also for inflammation and pain.  You may use ice or heat to your back as needed for discomfort.  Increase fluids to stay hydrated.  Return to the emergency department if any severe worsening of your symptoms in 2 to 3 days.  You may also follow-up with the [facility] urgent care.

## 2024-01-27 NOTE — ED Notes (Signed)
 See triage note  Presents with lower back pain  States pain started couple of days ago w/o known injury  ambulates well to treatment room

## 2024-01-27 NOTE — ED Triage Notes (Signed)
 Patient to ED via POV for back pain. States hurts in mid back all the way down. Started this AM. Denies injury.

## 2024-01-27 NOTE — ED Provider Notes (Signed)
 [facility] Provider Note    Event Date/Time   First MD Initiated Contact with Patient 01/27/24 1015     (approximate)   History   Back Pain   HPI  Mallory Willis is a 28 y.o. female   presents to the ED with complaint of back pain when waking up's morning.  Patient states she took 2 ibuprofen  at home without any relief.  She denies any injury to her back.  She states she has had 3 epidurals in the past and has a history of scoliosis.  Patient has history of gestational hypertension, hypokalemia, asthma and chronic back pain.      Physical Exam   Triage Vital Signs: ED Triage Vitals  Encounter Vitals Group     BP 01/27/24 1003 (!) 154/101     Systolic BP Percentile --      Diastolic BP Percentile --      Pulse Rate 01/27/24 1003 74     Resp 01/27/24 1003 17     Temp 01/27/24 1003 98.7 F (37.1 C)     Temp Source 01/27/24 1003 Oral     SpO2 01/27/24 1003 100 %     Weight 01/27/24 1002 190 lb (86.2 kg)     Height 01/27/24 1002 5\' 9"  (1.753 m)     Head Circumference --      Peak Flow --      Pain Score 01/27/24 1002 8     Pain Loc --      Pain Education --      Exclude from Growth Chart --     Most recent vital signs: Vitals:   01/27/24 1003  BP: (!) 154/101  Pulse: 74  Resp: 17  Temp: 98.7 F (37.1 C)  SpO2: 100%     General: Awake, no distress.  CV:  Good peripheral perfusion.  Resp:  Normal effort.  Abd:  No distention.  Other:  Mild tenderness on palpation of the right CVA area.  No point tenderness on palpation of the vertebral bodies.  Mild tenderness on palpation of the lumbar paravertebral muscles.  Patient is able to stand and ambulate without any assistance.  Good muscle strength bilaterally.   ED Results / Procedures / Treatments   Labs (all labs ordered are listed, but only abnormal results are displayed) Labs Reviewed  URINALYSIS, ROUTINE W REFLEX MICROSCOPIC - Abnormal; Notable for the following components:       Result Value   Color, Urine YELLOW (*)    APPearance HAZY (*)    Protein, ur 30 (*)    Leukocytes,Ua SMALL (*)    Bacteria, UA FEW (*)    All other components within normal limits  POC URINE PREG, ED      PROCEDURES:  Critical Care performed:   Procedures   MEDICATIONS ORDERED IN ED: Medications  ketorolac  (TORADOL ) 15 MG/ML injection 15 mg (15 mg Intravenous Given 01/27/24 1207)     IMPRESSION / MDM / ASSESSMENT AND PLAN / ED COURSE  I reviewed the triage vital signs and the nursing notes.   Differential diagnosis includes, but is not limited to, muscle skeletal pain, muscle skeletal strain, chronic pain secondary to prior epidurals, urinary tract infection, pyelonephritis.  28 year old female presents to the ED with complaint of back pain and on physical exam patient was slightly tender paravertebral muscles lumbar area.  Urinalysis did show 11-20 RBCs and 21-50 WBCs.  Patient was given injection of Toradol  15 mg prior to discharge.  A prescription for Septra  DS and Celebrex was sent to the pharmacy for her to begin taking twice a day for the next 10 days.  Patient is to follow-up with urgent care or the emergency department if any continued problems.  She is return to the emergency department if any severe worsening of her symptoms or urgent concerns.      Patient's presentation is most consistent with acute complicated illness / injury requiring diagnostic workup.  FINAL CLINICAL IMPRESSION(S) / ED DIAGNOSES   Final diagnoses:  Acute urinary tract infection  Acute bilateral low back pain without sciatica     Rx / DC Orders   ED Discharge Orders          Ordered    sulfamethoxazole -trimethoprim  (BACTRIM  DS) 800-160 MG tablet  2 times daily,   Status:  Discontinued        01/27/24 1159    sulfamethoxazole -trimethoprim  (BACTRIM  DS) 800-160 MG tablet  2 times daily        01/27/24 1201    celecoxib (CELEBREX) 100 MG capsule  Daily        01/27/24 1201              Note:  This document was prepared using Dragon voice recognition software and may include unintentional dictation errors.   Stafford Eagles, PA-C 01/27/24 1504    Arline Bennett, MD 01/28/24 [phone]

## 2024-05-27 ENCOUNTER — Other Ambulatory Visit: Payer: Self-pay

## 2024-05-27 ENCOUNTER — Emergency Department: Payer: Self-pay

## 2024-05-27 ENCOUNTER — Emergency Department
Admission: EM | Admit: 2024-05-27 | Discharge: 2024-05-27 | Disposition: A | Discharge status: Home / Self Care | Payer: Self-pay | Attending: Emergency Medicine | Admitting: Emergency Medicine

## 2024-05-27 DIAGNOSIS — O209 Hemorrhage in early pregnancy, unspecified: Secondary | ICD-10-CM | POA: Insufficient documentation

## 2024-05-27 DIAGNOSIS — D649 Anemia, unspecified: Secondary | ICD-10-CM | POA: Insufficient documentation

## 2024-05-27 DIAGNOSIS — Z3A09 9 weeks gestation of pregnancy: Secondary | ICD-10-CM | POA: Insufficient documentation

## 2024-05-27 DIAGNOSIS — O99011 Anemia complicating pregnancy, first trimester: Secondary | ICD-10-CM | POA: Insufficient documentation

## 2024-05-27 DIAGNOSIS — N939 Abnormal uterine and vaginal bleeding, unspecified: Secondary | ICD-10-CM

## 2024-05-27 DIAGNOSIS — O3680X0 Pregnancy with inconclusive fetal viability, not applicable or unspecified: Secondary | ICD-10-CM

## 2024-05-27 LAB — TYPE AND SCREEN
ABO/RH(D): O POS
Antibody Screen: NEGATIVE

## 2024-05-27 LAB — URINALYSIS, ROUTINE W REFLEX MICROSCOPIC
Bacteria, UA: NONE SEEN
Bilirubin Urine: NEGATIVE
Glucose, UA: NEGATIVE mg/dL
Ketones, ur: NEGATIVE mg/dL
Leukocytes,Ua: NEGATIVE
Nitrite: NEGATIVE
Protein, ur: NEGATIVE mg/dL
RBC / HPF: >50 RBC/hpf (ref 0–5)
Specific Gravity, Urine: 1.026 (ref 1.005–1.030)
pH: 5 (ref 5.0–8.0)

## 2024-05-27 LAB — CBC WITH DIFFERENTIAL/PLATELET
Abs Immature Granulocytes: 0.02 K/uL (ref 0.00–0.07)
Basophils Absolute: 0.1 K/uL (ref 0.0–0.1)
Basophils Relative: 1 %
Eosinophils Absolute: 0.2 K/uL (ref 0.0–0.5)
Eosinophils Relative: 3 %
HCT: 30.2 % — ABNORMAL LOW (ref 36.0–46.0)
Hemoglobin: 9 g/dL — ABNORMAL LOW (ref 12.0–15.0)
Immature Granulocytes: 0 %
Lymphocytes Relative: 30 %
Lymphs Abs: 2.2 K/uL (ref 0.7–4.0)
MCH: 22 pg — ABNORMAL LOW (ref 26.0–34.0)
MCHC: 29.8 g/dL — ABNORMAL LOW (ref 30.0–36.0)
MCV: 73.7 fL — ABNORMAL LOW (ref 80.0–100.0)
Monocytes Absolute: 0.5 K/uL (ref 0.1–1.0)
Monocytes Relative: 7 %
Neutro Abs: 4.2 K/uL (ref 1.7–7.7)
Neutrophils Relative %: 59 %
Platelets: 321 K/uL (ref 150–400)
RBC: 4.1 MIL/uL (ref 3.87–5.11)
RDW: 19 % — ABNORMAL HIGH (ref 11.5–15.5)
WBC: 7.1 K/uL (ref 4.0–10.5)
nRBC: 0 % (ref 0.0–0.2)

## 2024-05-27 LAB — BASIC METABOLIC PANEL WITH GFR
Anion gap: 9 (ref 5–15)
BUN: 13 mg/dL (ref 6–20)
CO2: 24 mmol/L (ref 22–32)
Calcium: 8.5 mg/dL — ABNORMAL LOW (ref 8.9–10.3)
Chloride: 106 mmol/L (ref 98–111)
Creatinine, Ser: 0.68 mg/dL (ref 0.44–1.00)
GFR, Estimated: >60 mL/min (ref >60)
Glucose, Bld: 82 mg/dL (ref 70–99)
Potassium: 3.6 mmol/L (ref 3.5–5.1)
Sodium: 139 mmol/L (ref 135–145)

## 2024-05-27 LAB — HCG, QUANTITATIVE, PREGNANCY: hCG, Beta Chain, Quant, S: 58 m[IU]/mL — ABNORMAL HIGH (ref <5)

## 2024-05-27 LAB — POC URINE PREG, ED: Preg Test, Ur: POSITIVE — AB

## 2024-05-27 MED ORDER — OXYCODONE HCL 5 MG PO TABS: 5.0000 mg | ORAL_TABLET | Freq: Three times a day (TID) | ORAL | 0 refills | Status: DC | PRN

## 2024-05-27 MED ORDER — ACETAMINOPHEN 325 MG PO TABS
650.0000 mg | ORAL_TABLET | Freq: Once | ORAL | Status: AC
  Administered 2024-05-27: 650 mg via ORAL
  Filled 2024-05-27: qty 2

## 2024-05-27 NOTE — ED Provider Notes (Signed)
 [facility] Provider Note    Event Date/Time   First MD Initiated Contact with Patient 05/27/24 [id number]     (approximate)   History   Vaginal Bleeding   HPI  Mallory Willis is a 28 y.o. female G4, P3 presents emergency department with vaginal cramping, bleeding.  Patient had positive pregnancy test last week.  Last LMP was in July.  Patient states she is usually very regular.  States spotting/light bleeding started on Wednesday, today its gotten heavier.  States having some pain with the cramping.  Denies fever, chills      Physical Exam   Triage Vital Signs: ED Triage Vitals  Encounter Vitals Group     BP 05/27/24 0820 (!) 143/95     Girls Systolic BP Percentile --      Girls Diastolic BP Percentile --      Boys Systolic BP Percentile --      Boys Diastolic BP Percentile --      Pulse Rate 05/27/24 0820 72     Resp 05/27/24 0820 17     Temp 05/27/24 0820 98.7 F (37.1 C)     Temp Source 05/27/24 0820 Oral     SpO2 05/27/24 0820 100 %     Weight 05/27/24 0825 180 lb (81.6 kg)     Height 05/27/24 0825 5' 9 (1.753 m)     Head Circumference --      Peak Flow --      Pain Score 05/27/24 0825 9     Pain Loc --      Pain Education --      Exclude from Growth Chart --     Most recent vital signs: Vitals:   05/27/24 0820 05/27/24 1128  BP: (!) 143/95 131/76  Pulse: 72 60  Resp: 17 18  Temp: 98.7 F (37.1 C) 98.1 F (36.7 C)  SpO2: 100% 100%     General: Awake, no distress.   CV:  Good peripheral perfusion. Resp:  Normal effort.  Abd:  No distention.   Other:     ED Results / Procedures / Treatments   Labs (all labs ordered are listed, but only abnormal results are displayed) Labs Reviewed  CBC WITH DIFFERENTIAL/PLATELET - Abnormal; Notable for the following components:      Result Value   Hemoglobin 9.0 (*)    HCT 30.2 (*)    MCV 73.7 (*)    MCH 22.0 (*)    MCHC 29.8 (*)    RDW 19.0 (*)    All other components within  normal limits  URINALYSIS, ROUTINE W REFLEX MICROSCOPIC - Abnormal; Notable for the following components:   Color, Urine YELLOW (*)    APPearance HAZY (*)    Hgb urine dipstick LARGE (*)    All other components within normal limits  HCG, QUANTITATIVE, PREGNANCY - Abnormal; Notable for the following components:   hCG, Beta Chain, Quant, S 58 (*)    All other components within normal limits  BASIC METABOLIC PANEL WITH GFR - Abnormal; Notable for the following components:   Calcium 8.5 (*)    All other components within normal limits  POC URINE PREG, ED - Abnormal; Notable for the following components:   Preg Test, Ur Positive (*)    All other components within normal limits  TYPE AND SCREEN     EKG     RADIOLOGY Ultrasound OB less than 14 weeks    PROCEDURES:   Procedures  Critical Care:  no Chief Complaint  Patient presents with   Vaginal Bleeding      MEDICATIONS ORDERED IN ED: Medications  acetaminophen  (TYLENOL ) tablet 650 mg (650 mg Oral Given 05/27/24 1014)     IMPRESSION / MDM / ASSESSMENT AND PLAN / ED COURSE  I reviewed the triage vital signs and the nursing notes.                              Differential diagnosis includes, but is not limited to, vaginal bleeding in pregnancy, ectopic, subchorionic hemorrhage, miscarriage, threatened miscarriage  Patient's presentation is most consistent with acute illness / injury with system symptoms.    Medications given: Tylenol  p.o. for pain  CBC shows anemia with a hemoglobin of 9.0, however when I did review her past medical charts it does show that her normal hemoglobin and ranges from 9-11.  Feel at this time this is not a abnormal number for her.  POC pregnancy positive  Beta-hCG is very low at 1, therefore I do feel the patient's had a miscarriage.  I did order ultrasound OB less than 14 weeks to evaluate for miscarriage.   Ultrasound OB less than 14 weeks does not show an IUP.  With the positive  pregnancy test this is a pregnancy of unknown origin versus miscarriage.  This was independently reviewed and interpreted by me.  I did discuss this with the patient.  She is to have a repeat beta-hCG in 2 days.  If it is decreasing then we know if she miscarried if it is increasing then it is a pregnancy of unknown origin.  She is to continue take Tylenol  for pain.  She is given prescription for oxycodone  if she is having severe cramping.  Strict instructions to return if bleeding through more than 2 pads per hour.  She is in agreement with this treatment plan.  Discharged stable condition.   FINAL CLINICAL IMPRESSION(S) / ED DIAGNOSES   Final diagnoses:  Pregnancy of unknown anatomic location  Vaginal bleeding     Rx / DC Orders   ED Discharge Orders          Ordered    oxyCODONE  (ROXICODONE ) 5 MG immediate release tablet  Every 8 hours PRN        05/27/24 1237             Note:  This document was prepared using Dragon voice recognition software and may include unintentional dictation errors.    Gasper Devere ORN, PA-C 05/27/24 1421    Jacolyn Pae, MD 05/27/24 1444

## 2024-05-27 NOTE — ED Triage Notes (Signed)
 Pt c/o vaginal bleeding x2 days- heavier and w/ clots today- and pelvic cramping. Pt states she had a positive pregnancy test at home a week ago. LMP July 9th. Pt AOX4, NAD noted.

## 2024-05-27 NOTE — ED Notes (Signed)
Patient given a warm blanket per request.

## 2024-05-27 NOTE — Discharge Instructions (Signed)
 You will need to be rechecked by your doctor or return emergency department in 2 days for repeat beta hCG to determine if your number is decreasing.  If you bleed through more than 2 pads per hour please return the emergency department.

## 2024-05-27 NOTE — ED Notes (Signed)
 Pt maybe left prior to receiving paperwork

## 2024-06-29 ENCOUNTER — Emergency Department
Admission: EM | Admit: 2024-06-29 | Discharge: 2024-06-29 | Disposition: A | Discharge status: Home / Self Care | Payer: Self-pay | Attending: Emergency Medicine | Admitting: Emergency Medicine

## 2024-06-29 ENCOUNTER — Other Ambulatory Visit: Payer: Self-pay

## 2024-06-29 DIAGNOSIS — K0889 Other specified disorders of teeth and supporting structures: Secondary | ICD-10-CM | POA: Insufficient documentation

## 2024-06-29 DIAGNOSIS — J45909 Unspecified asthma, uncomplicated: Secondary | ICD-10-CM | POA: Insufficient documentation

## 2024-06-29 DIAGNOSIS — R030 Elevated blood-pressure reading, without diagnosis of hypertension: Secondary | ICD-10-CM | POA: Insufficient documentation

## 2024-06-29 LAB — POC URINE PREG, ED: Preg Test, Ur: NEGATIVE

## 2024-06-29 LAB — HCG, QUANTITATIVE, PREGNANCY: hCG, Beta Chain, Quant, S: <1 m[IU]/mL (ref <5)

## 2024-06-29 MED ORDER — OXYCODONE-ACETAMINOPHEN 5-325 MG PO TABS: 1.0000 | ORAL_TABLET | Freq: Three times a day (TID) | ORAL | 0 refills | Status: AC | PRN

## 2024-06-29 MED ORDER — OXYCODONE HCL 5 MG PO TABS
5.0000 mg | ORAL_TABLET | Freq: Once | ORAL | Status: AC
  Administered 2024-06-29: 5 mg via ORAL
  Filled 2024-06-29: qty 1

## 2024-06-29 MED ORDER — CLINDAMYCIN HCL 150 MG PO CAPS: 450.0000 mg | ORAL_CAPSULE | Freq: Four times a day (QID) | ORAL | 0 refills | Status: AC

## 2024-06-29 MED ORDER — ACETAMINOPHEN 325 MG PO TABS
975.0000 mg | ORAL_TABLET | Freq: Once | ORAL | Status: AC
  Administered 2024-06-29: 975 mg via ORAL
  Filled 2024-06-29: qty 3

## 2024-06-29 MED ORDER — AMLODIPINE BESYLATE 5 MG PO TABS: 5.0000 mg | ORAL_TABLET | Freq: Every day | ORAL | 1 refills | Status: AC

## 2024-06-29 MED ORDER — AMLODIPINE BESYLATE 5 MG PO TABS: 5.0000 mg | ORAL_TABLET | Freq: Once | ORAL | Status: DC

## 2024-06-29 MED ORDER — CLINDAMYCIN HCL 150 MG PO CAPS
450.0000 mg | ORAL_CAPSULE | Freq: Once | ORAL | Status: AC
  Administered 2024-06-29: 450 mg via ORAL
  Filled 2024-06-29: qty 3

## 2024-06-29 NOTE — ED Provider Notes (Signed)
 [facility] Provider Note    Event Date/Time   First MD Initiated Contact with Patient 06/29/24 1457     (approximate)   History   Dental Pain   HPI  Mallory Willis is a 28 y.o. female  with a past medical history of gestational hypertension/pregnancy-induced hypertension, asthma, chronic back pain presents to the emergency department with right upper dental pain x 4 days.  Patient denies fever, chills, shortness of breath or chest pain, abdominal pain, vision changes, difficulty swallowing, pus-like discharge or bleeding from her mouth, otalgia.  Patient states she brushes her teeth twice daily.  Patient does not have an established dentist.  Reports she has taken Tylenol  and ibuprofen  around 9-10 AM this morning. Has allergies to amoxicillin, ceclor, tramadol .  Review of chart shows patient was recently seen on 05/27/24 in this emergency department for vaginal bleeding, positive pregnancy test and beta-hCG at 58, OB ultrasound without IUP and was told to follow-up for repeat beta-hCG with OB/GYN or family medicine, but did not follow-up.  Reports she is currently menstruating at this time.  Reports she used to take blood pressure medication for gestational hypertension but was taken off of it over a year ago.   Physical Exam   Triage Vital Signs: ED Triage Vitals  Encounter Vitals Group     BP 06/29/24 1327 (!) 191/116     Girls Systolic BP Percentile --      Girls Diastolic BP Percentile --      Boys Systolic BP Percentile --      Boys Diastolic BP Percentile --      Pulse Rate 06/29/24 1325 81     Resp 06/29/24 1325 18     Temp 06/29/24 1325 98.5 F (36.9 C)     Temp Source 06/29/24 1325 Oral     SpO2 06/29/24 1325 100 %     Weight --      Height --      Head Circumference --      Peak Flow --      Pain Score 06/29/24 1325 10     Pain Loc --      Pain Education --      Exclude from Growth Chart --     Most recent vital signs: Vitals:    06/29/24 1327 06/29/24 1535  BP: (!) 191/116 (!) 169/101  Pulse:    Resp:    Temp:    SpO2:      General: Awake, tearful at this time. Eyes:No scleral icterus or conjunctival injection. Ears/Nose/Throat: TMs intact b/l. Nares patent, no nasal discharge. Oropharynx moist, no erythema or exudate. Poor dentition. Several teeth have cavities. Dentin exposure and TTP along right upper gumline in the area of teeth 4-7, no obvious fluctuant area. No TTP with jaw palpation. No uvular deviation.  Neck: Supple, no lymphadenopathy, no nuchal rigidity. CV: Good peripheral perfusion. Respiratory:Normal respiratory effort.  No respiratory distress. CTAB. GI: Soft, non-distended, non-tender.  Skin:Warm, dry, intact. No rashes, lesions, or ecchymosis. No cyanosis or pallor. Neurological: A&Ox4 to person, place, time, and situation.   ED Results / Procedures / Treatments   Labs (all labs ordered are listed, but only abnormal results are displayed) Labs Reviewed  HCG, QUANTITATIVE, PREGNANCY  POC URINE PREG, ED     EKG     RADIOLOGY    PROCEDURES:  Critical Care performed: No  Procedures   MEDICATIONS ORDERED IN ED: Medications  acetaminophen  (TYLENOL ) tablet 975 mg (975 mg  Oral Given 06/29/24 1535)  oxyCODONE  (Oxy IR/ROXICODONE ) immediate release tablet 5 mg (5 mg Oral Given 06/29/24 1655)  clindamycin  (CLEOCIN ) capsule 450 mg (450 mg Oral Given 06/29/24 1655)     IMPRESSION / MDM / ASSESSMENT AND PLAN / ED COURSE  I reviewed the triage vital signs and the nursing notes.                              Differential diagnosis includes, but is not limited to, dental caries, gingivitis, dental abscess  Patient's presentation is most consistent with acute, uncomplicated illness.  Patient is a 28 year old female presenting with right upper dental pain.  Afebrile, tearful due to pain but well-appearing.  No uvular deviation, trismus or puslike drooling.  No fluctuant area in the  mouth, no asymmetric or submandibular facial swelling, but she does have several dental caries and poor dentition with dentin exposure of several teeth in her area of pain.  Did give her Tylenol .  Follow-up UPT and beta hCG ordered since patient did not follow-up from prior visit on 05/27/24 for suspected miscarriage.  UPT here is negative, beta-hCG less than 1.  Provided patient with dose of clindamycin , discussed with pharmacist prior to administration due to red dye allergy and ensured medication did not have red dye in it.  Also given 1 dose of oxycodone  here for pain.  Will send her with prescriptions for clindamycin  and Percocet. Placed note in clindamycin  Rx to ensure she receives a formulary without red dye #40.  Blood pressure is elevated at 169/101 but was downtrending from initial BP 191/116. Has hx of gestational HTN from 1 year ago. Patient is asymptomatic at this time.  Discussed this patient with supervising physician Dr. Lorelle Cheadle, who recommended starting patient on amlodipine  5 mg daily until she can follow-up and establish with a PCP.  Did discuss precautions regarding taking the Percocet and PDMP reviewed prior to written Rx.  Gave patient list of primary care resources and dental offices to establish with/follow up for her care.  The patient may return to the emergency department for any new, worsening, or concerning symptoms. Patient was given the opportunity to ask questions; all questions were answered. Emergency department return precautions were discussed with the patient.  Patient is in agreement to the treatment plan.  Patient is stable for discharge.    FINAL CLINICAL IMPRESSION(S) / ED DIAGNOSES   Final diagnoses:  Pain, dental  Elevated blood pressure reading     Rx / DC Orders   ED Discharge Orders          Ordered    clindamycin  (CLEOCIN ) 150 MG capsule  4 times daily       Note to Pharmacy: Please provide Liridona with the capsules that do NOT have red dye in it. She  has a rash allergy to red dye #40.   06/29/24 1646    oxyCODONE -acetaminophen  (PERCOCET) 5-325 MG tablet  Every 8 hours PRN        06/29/24 1646    amLODipine  (NORVASC ) 5 MG tablet  Daily        06/29/24 1703             Note:  This document was prepared using Dragon voice recognition software and may include unintentional dictation errors.     Sheron Salm, PA-C 06/29/24 1709    Cheadle Lorelle Cummins, MD 06/29/24 [phone]

## 2024-06-29 NOTE — Discharge Instructions (Addendum)
 You have been seen in the Emergency Department (ED) today for dental pain.  Please take your prescribed antibiotic.  Please take it with any over-the-counter probiotic and with food. Do not participate in strenuous physical activity for the next 6 weeks after completing the antibiotic.   You may take pain medication as needed but ONLY as prescribed. Please do not drive, work, make legal-binding decisions, climb ladders or heights while taking the pain medication. You should also take over-the-counter pain medication such as ibuprofen  according to the label instructions unless a doctor has previously told you to avoid this type of medication (due to stomach ulcers, for example).  Alternatively you can take ibuprofen  600 mg by mouth three times daily with meals for no more than 5 days.  Please see you dentist as soon as possible; only a dentist will be able to fix your problem(s).  Please see below for dental follow up options.  Return to the ED if you develop worsening pain, fever, pus/drainage, difficulty breathing, or other symptoms that concern you.   I have also provided you with a list of resources to establish care with primary care. I would like for you to check your blood pressure daily at home; they have wrist machines at any store.  It could be elevated due to pain. Will provide you with a small dose of blood pressure medication to take until you can be re-evaluated by a primary care provider.  Return to your prior OB/GYN or a primary care provider for recheck of your blood pressure to see if you need to continue blood pressure medication.  Return to the Emergency Department (ED) if you experience any worsening chest pain/pressure/tightness, difficulty breathing, vision changes, or sudden sweating, or other symptoms that concern you.

## 2024-06-29 NOTE — ED Triage Notes (Signed)
 Patient states right upper dental pain x 4 days.
